# Patient Record
Sex: Male | Born: 1980
Health system: Southern US, Community
[De-identification: ages and names within clinical notes are randomized; demographics above are authoritative.]

## PROBLEM LIST (undated history)

## (undated) ENCOUNTER — Ambulatory Visit (HOSPITAL_COMMUNITY): Discharge status: Absent without leave | Payer: MEDICAID

## (undated) DIAGNOSIS — B1920 Unspecified viral hepatitis C without hepatic coma: Secondary | ICD-10-CM

## (undated) HISTORY — PX: FRACTURE SURGERY: SHX138

## (undated) HISTORY — PX: ANKLE SURGERY: SHX546

---

## 2002-06-26 ENCOUNTER — Encounter: Payer: Self-pay | Admitting: Emergency Medicine

## 2002-06-26 ENCOUNTER — Emergency Department (HOSPITAL_COMMUNITY): Admission: EM | Admit: 2002-06-26 | Discharge: 2002-06-26 | Payer: Self-pay | Admitting: Emergency Medicine

## 2008-02-02 ENCOUNTER — Emergency Department (HOSPITAL_COMMUNITY): Admission: EM | Admit: 2008-02-02 | Discharge: 2008-02-03 | Payer: Self-pay | Admitting: Emergency Medicine

## 2008-03-21 ENCOUNTER — Emergency Department (HOSPITAL_COMMUNITY): Admission: EM | Admit: 2008-03-21 | Discharge: 2008-03-21 | Payer: Self-pay | Admitting: Emergency Medicine

## 2009-09-30 ENCOUNTER — Emergency Department (HOSPITAL_COMMUNITY): Admission: EM | Admit: 2009-09-30 | Discharge: 2009-09-30 | Payer: Self-pay | Admitting: Emergency Medicine

## 2009-10-05 ENCOUNTER — Inpatient Hospital Stay (HOSPITAL_COMMUNITY): Admission: RE | Admit: 2009-10-05 | Discharge: 2009-10-05 | Payer: Self-pay | Admitting: Orthopaedic Surgery

## 2010-11-23 ENCOUNTER — Ambulatory Visit (HOSPITAL_COMMUNITY)
Admission: RE | Admit: 2010-11-23 | Discharge: 2010-11-23 | Payer: Self-pay | Source: Home / Self Care | Attending: Internal Medicine | Admitting: Internal Medicine

## 2011-03-17 LAB — URINALYSIS, ROUTINE W REFLEX MICROSCOPIC
Hgb urine dipstick: NEGATIVE
Nitrite: NEGATIVE
Protein, ur: NEGATIVE mg/dL
Specific Gravity, Urine: 1.026 (ref 1.005–1.030)
Urobilinogen, UA: 0.2 mg/dL (ref 0.0–1.0)

## 2011-03-17 LAB — COMPREHENSIVE METABOLIC PANEL
ALT: 15 U/L (ref 0–53)
AST: 22 U/L (ref 0–37)
Calcium: 9.4 mg/dL (ref 8.4–10.5)
Creatinine, Ser: 0.81 mg/dL (ref 0.4–1.5)
GFR calc Af Amer: >60 mL/min (ref >60)
Sodium: 138 mEq/L (ref 135–145)
Total Protein: 7.4 g/dL (ref 6.0–8.3)

## 2011-03-17 LAB — CBC
MCHC: 35.3 g/dL (ref 30.0–36.0)
RDW: 13.3 % (ref 11.5–15.5)

## 2011-03-17 LAB — DIFFERENTIAL
Eosinophils Absolute: 0.3 10*3/uL (ref 0.0–0.7)
Eosinophils Relative: 4 % (ref 0–5)
Lymphocytes Relative: 20 % (ref 12–46)
Lymphs Abs: 1.7 10*3/uL (ref 0.7–4.0)
Monocytes Relative: 10 % (ref 3–12)
Neutrophils Relative %: 67 % (ref 43–77)

## 2011-03-17 LAB — PROTIME-INR: INR: 0.87 (ref 0.00–1.49)
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
## 2011-07-13 ENCOUNTER — Emergency Department (HOSPITAL_COMMUNITY)
Admission: EM | Admit: 2011-07-13 | Discharge: 2011-07-13 | Discharge status: Against Medical Advice | Payer: Self-pay | Attending: Emergency Medicine | Admitting: Emergency Medicine

## 2011-07-13 DIAGNOSIS — R1011 Right upper quadrant pain: Secondary | ICD-10-CM | POA: Insufficient documentation

## 2012-06-16 ENCOUNTER — Encounter (HOSPITAL_COMMUNITY): Payer: Self-pay | Admitting: *Deleted

## 2012-06-16 DIAGNOSIS — F17200 Nicotine dependence, unspecified, uncomplicated: Secondary | ICD-10-CM | POA: Insufficient documentation

## 2012-06-16 DIAGNOSIS — T148XXA Other injury of unspecified body region, initial encounter: Secondary | ICD-10-CM | POA: Insufficient documentation
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
## 2012-06-17 ENCOUNTER — Emergency Department (HOSPITAL_COMMUNITY)
Admission: EM | Admit: 2012-06-17 | Discharge: 2012-06-17 | Disposition: A | Discharge status: Home / Self Care | Payer: No Typology Code available for payment source | Attending: Emergency Medicine | Admitting: Emergency Medicine

## 2012-06-17 DIAGNOSIS — T148XXA Other injury of unspecified body region, initial encounter: Secondary | ICD-10-CM

## 2012-06-17 MED ORDER — IBUPROFEN 600 MG PO TABS: 600.0000 mg | ORAL_TABLET | Freq: Four times a day (QID) | ORAL | Status: AC | PRN

## 2012-06-17 MED ORDER — OXYCODONE-ACETAMINOPHEN 5-325 MG PO TABS
1.0000 | ORAL_TABLET | Freq: Once | ORAL | Status: AC
  Administered 2012-06-17: 1 via ORAL
  Filled 2012-06-17: qty 1

## 2012-06-17 MED ORDER — IBUPROFEN 400 MG PO TABS
600.0000 mg | ORAL_TABLET | Freq: Once | ORAL | Status: AC
  Administered 2012-06-17: 600 mg via ORAL
  Filled 2012-06-17: qty 1

## 2012-06-17 MED ORDER — OXYCODONE-ACETAMINOPHEN 5-325 MG PO TABS: 1.0000 | ORAL_TABLET | Freq: Four times a day (QID) | ORAL | Status: AC | PRN

## 2012-06-17 MED ORDER — METAXALONE 800 MG PO TABS
800.0000 mg | ORAL_TABLET | Freq: Once | ORAL | Status: AC
  Administered 2012-06-17: 800 mg via ORAL
  Filled 2012-06-17: qty 1

## 2012-06-17 MED ORDER — METAXALONE 800 MG PO TABS: 800.0000 mg | ORAL_TABLET | Freq: Three times a day (TID) | ORAL | Status: AC

## 2012-06-17 NOTE — ED Notes (Signed)
Pt for discharge.Vital signs stable.GCS 15 
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
## 2012-06-17 NOTE — ED Provider Notes (Signed)
Medical screening examination/treatment/procedure(s) were performed by non-physician practitioner and as supervising physician I was immediately available for consultation/collaboration.  Yilia Sacca M Cheresa Siers, MD 06/17/12 2153 

## 2013-03-20 ENCOUNTER — Emergency Department (HOSPITAL_COMMUNITY)
Admission: EM | Admit: 2013-03-20 | Discharge: 2013-03-20 | Disposition: A | Discharge status: Home / Self Care | Payer: Self-pay | Attending: Emergency Medicine | Admitting: Emergency Medicine

## 2013-03-20 ENCOUNTER — Encounter (HOSPITAL_COMMUNITY): Payer: Self-pay | Admitting: *Deleted

## 2013-03-20 DIAGNOSIS — J3489 Other specified disorders of nose and nasal sinuses: Secondary | ICD-10-CM | POA: Insufficient documentation

## 2013-03-20 DIAGNOSIS — K029 Dental caries, unspecified: Secondary | ICD-10-CM | POA: Insufficient documentation

## 2013-03-20 DIAGNOSIS — R220 Localized swelling, mass and lump, head: Secondary | ICD-10-CM | POA: Insufficient documentation

## 2013-03-20 DIAGNOSIS — K047 Periapical abscess without sinus: Secondary | ICD-10-CM | POA: Insufficient documentation

## 2013-03-20 DIAGNOSIS — F17200 Nicotine dependence, unspecified, uncomplicated: Secondary | ICD-10-CM | POA: Insufficient documentation

## 2013-03-20 DIAGNOSIS — R221 Localized swelling, mass and lump, neck: Secondary | ICD-10-CM | POA: Insufficient documentation

## 2013-03-20 DIAGNOSIS — R6883 Chills (without fever): Secondary | ICD-10-CM | POA: Insufficient documentation

## 2013-03-20 MED ORDER — OXYCODONE-ACETAMINOPHEN 5-325 MG PO TABS: 2.0000 | ORAL_TABLET | ORAL | Status: DC | PRN

## 2013-03-20 MED ORDER — PENICILLIN V POTASSIUM 500 MG PO TABS: 500.0000 mg | ORAL_TABLET | Freq: Three times a day (TID) | ORAL | Status: AC

## 2013-03-20 MED ORDER — ONDANSETRON 4 MG PO TBDP
4.0000 mg | ORAL_TABLET | Freq: Once | ORAL | Status: AC
  Administered 2013-03-20: 4 mg via ORAL
  Filled 2013-03-20: qty 1

## 2013-03-20 MED ORDER — HYDROMORPHONE HCL PF 2 MG/ML IJ SOLN
2.0000 mg | Freq: Once | INTRAMUSCULAR | Status: AC
  Administered 2013-03-20: 2 mg via INTRAMUSCULAR
  Filled 2013-03-20: qty 1

## 2013-03-20 NOTE — ED Notes (Signed)
Pt slammed the door to the room yelling inside the room  Throwing things cursing because he is still waiting to be seen

## 2013-03-20 NOTE — ED Notes (Signed)
The pt is very angry about the wait to see a doctor he reports that his tooth is killing him

## 2013-03-20 NOTE — ED Notes (Signed)
Med given pts mother at the bedside

## 2013-03-20 NOTE — ED Notes (Signed)
Pt with intermittent dental pain for some time (tooth decay noted to L upper molar).  Last night pt noticed swelling to L maxillary sinus.  No redness, but swelling noted.  Pt afebrile.

## 2013-03-20 NOTE — ED Notes (Signed)
Swelling noted to left maxillary area. States has painful tooth in that area.

## 2013-03-20 NOTE — ED Provider Notes (Signed)
History    This chart was scribed for non-physician practitioner Arthor Captain, PA-C working with Joya Gaskins, MD by Gerlean Ren, ED Scribe. This patient was seen in room TR07C/TR07C and the patient's care was started at 4:12 PM.   CSN: [id number]  Arrival date & time 03/20/13  1417   First MD Initiated Contact with Patient 03/20/13 1451      Chief Complaint  Patient presents with  . Facial Swelling  . Dental Pain    The history is provided by the patient. No language interpreter was used.  Jeffery Bennett is a 32 y.o. male who presents to the Emergency Department complaining of constant left upper dental pain that radiates into left jaw with associated facial swelling over affected tooth that began rapidly last night.  No associated injury or trauma to the area.  Pt reports chills last night with no fever measured.  Pain worsened with temperature.  Pt denies painful swallowing or difficulty swallowing but reports pain with eating.  Pt reports h/o dental problems.  Pt is a current everyday smoker.  No known allergies.   History reviewed. No pertinent past medical history.  Past Surgical History  Procedure Laterality Date  . Ankle surgery      No family history on file.  History  Substance Use Topics  . Smoking status: Current Every Day Smoker -- 1.00 packs/day    Types: Cigarettes  . Smokeless tobacco: Not on file  . Alcohol Use: No      Review of Systems  Constitutional: Negative for fever and chills.  HENT: Positive for facial swelling, dental problem and sinus pressure. Negative for sore throat, trouble swallowing and voice change.   Respiratory: Negative for cough and shortness of breath.   Cardiovascular: Negative for chest pain and palpitations.  Gastrointestinal: Negative for vomiting, abdominal pain, diarrhea and constipation.  Genitourinary: Negative for dysuria, urgency and frequency.  Musculoskeletal: Negative for myalgias and arthralgias.  Skin:  Negative for rash.  Neurological: Negative for numbness and headaches.   See HPI.  Allergies  Review of patient's allergies indicates no known allergies.  Home Medications   Current Outpatient Rx  Name  Route  Sig  Dispense  Refill  . naproxen sodium (ANAPROX) 220 MG tablet   Oral   Take 880 mg by mouth daily as needed (tooth ache.).           BP 147/98  Pulse 110  Temp(Src) 98.2 F (36.8 C) (Oral)  Resp 16  SpO2 99%  Physical Exam  Nursing note and vitals reviewed. Constitutional: He is oriented to person, place, and time. He appears well-developed and well-nourished. No distress.  HENT:  Head: Normocephalic and atraumatic.    Mouth/Throat: Uvula is midline. No oral lesions. Dental abscesses and dental caries present. No edematous.    Uvula midline, no pharyngeal erythema. Moderate swelling of left maxillary area.    Eyes: EOM are normal.  Neck: Neck supple. No tracheal deviation present.  No cervical adenopathy  Cardiovascular: Normal rate.   Pulmonary/Chest: Effort normal. No respiratory distress.  Musculoskeletal: Normal range of motion.  Neurological: He is alert and oriented to person, place, and time.  Skin: Skin is warm and dry.  Psychiatric: He has a normal mood and affect. His behavior is normal.    ED Course  Procedures (including critical care time) INCISION AND DRAINAGE Performed by: Arthor Captain Consent: Verbal consent obtained. Risks and benefits: risks, benefits and alternatives were discussed Type: abscess  Body area:  dental  Anesthesia: local infiltration  Incision was made with a scalpel.  Local anesthetic: bupivicaine 0.5% with epinephrine  Anesthetic total: 0.2 ml  Complexity: complex Blunt dissection to break up loculations  Drainage: bloody   Packing material: 1/4 in iodoform gauze  Patient tolerance: Patient tolerated the procedure well with no immediate complications. unable to drain abscess of purulent  discharge.   DIAGNOSTIC STUDIES: Oxygen Saturation is 99% on room air, normal by my interpretation.    COORDINATION OF CARE: 4:15 PM- Discussed pain relief here and possible I&D to open fluctuant region.  Pt verbalizes consent and understanding.      1. Dental abscess       MDM  Patient with toothache and abscess.  Exam unconcerning for Ludwig's angina or spread of infection.  Will treat with penicillin and pain medicine.  Urged patient to follow-up with dentist.      I personally performed the services described in this documentation, which was scribed in my presence. The recorded information has been reviewed and is accurate.       Arthor Captain, PA-C 03/24/13 1814

## 2013-03-25 NOTE — ED Provider Notes (Signed)
Medical screening examination/treatment/procedure(s) were performed by non-physician practitioner and as supervising physician I was immediately available for consultation/collaboration.   Joya Gaskins, MD 03/25/13 [phone]

## 2014-05-13 ENCOUNTER — Emergency Department (HOSPITAL_COMMUNITY)
Admission: EM | Admit: 2014-05-13 | Discharge: 2014-05-13 | Disposition: A | Discharge status: Home / Self Care | Payer: Self-pay | Attending: Emergency Medicine | Admitting: Emergency Medicine

## 2014-05-13 ENCOUNTER — Emergency Department (HOSPITAL_COMMUNITY): Payer: Self-pay

## 2014-05-13 ENCOUNTER — Encounter (HOSPITAL_COMMUNITY): Payer: Self-pay | Admitting: Emergency Medicine

## 2014-05-13 DIAGNOSIS — Z791 Long term (current) use of non-steroidal anti-inflammatories (NSAID): Secondary | ICD-10-CM | POA: Insufficient documentation

## 2014-05-13 DIAGNOSIS — Z79899 Other long term (current) drug therapy: Secondary | ICD-10-CM | POA: Insufficient documentation

## 2014-05-13 DIAGNOSIS — Z8781 Personal history of (healed) traumatic fracture: Secondary | ICD-10-CM | POA: Insufficient documentation

## 2014-05-13 DIAGNOSIS — F17200 Nicotine dependence, unspecified, uncomplicated: Secondary | ICD-10-CM | POA: Insufficient documentation

## 2014-05-13 DIAGNOSIS — M25579 Pain in unspecified ankle and joints of unspecified foot: Secondary | ICD-10-CM | POA: Insufficient documentation

## 2014-05-13 MED ORDER — NAPROXEN 500 MG PO TABS: 500.0000 mg | ORAL_TABLET | Freq: Two times a day (BID) | ORAL | Status: DC

## 2014-05-13 MED ORDER — OXYCODONE-ACETAMINOPHEN 5-325 MG PO TABS: 1.0000 | ORAL_TABLET | Freq: Four times a day (QID) | ORAL | Status: DC | PRN

## 2014-05-13 NOTE — ED Notes (Signed)
Pt was working out in yard yesterday and thinks screw came out of his ankle.  Painful to apply pressure.

## 2014-05-13 NOTE — ED Provider Notes (Signed)
Medical screening examination/treatment/procedure(s) were performed by non-physician practitioner and as supervising physician I was immediately available for consultation/collaboration.  Christopher J. Pollina, MD 05/13/14 2059 
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
## 2014-05-13 NOTE — Discharge Instructions (Signed)
Please read and follow all provided instructions.  Your diagnoses today include:  1. Ankle pain     Tests performed today include:  An x-ray of the affected area - shows loose screw in ankle  Vital signs. See below for your results today.   Medications prescribed:   Percocet (oxycodone/acetaminophen) - narcotic pain medication  DO NOT drive or perform any activities that require you to be awake and alert because this medicine can make you drowsy. BE VERY CAREFUL not to take multiple medicines containing Tylenol (also called acetaminophen). Doing so can lead to an overdose which can damage your liver and cause liver failure and possibly death.  Take any prescribed medications only as directed.  Home care instructions:   Follow any educational materials contained in this packet  Follow R.I.C.E. Protocol:  R - rest your injury   I  - use ice on injury without applying directly to skin  C - compress injury with bandage or splint  E - elevate the injury as much as possible  Follow-up instructions: Please follow-up with your orthopedic physician. Call tomorrow for an appointment.   Return instructions:   Please return if your toes are numb or tingling, appear gray or blue, or you have severe pain (also elevate leg and loosen splint or wrap if you were given one)  Please return to the Emergency Department if you experience worsening symptoms.   Please return if you have any other emergent concerns.  Additional Information:  Your vital signs today were: BP 124/85   Pulse 120   Temp(Src) 98.4 F (36.9 C) (Oral)   Resp 18   Wt 155 lb 6 oz (70.478 kg)   SpO2 98% If your blood pressure (BP) was elevated above 135/85 this visit, please have this repeated by your doctor within one month. --------------

## 2015-11-06 ENCOUNTER — Encounter (HOSPITAL_COMMUNITY): Payer: Self-pay | Admitting: Emergency Medicine

## 2015-11-06 ENCOUNTER — Emergency Department (HOSPITAL_COMMUNITY)
Admission: EM | Admit: 2015-11-06 | Discharge: 2015-11-06 | Disposition: A | Discharge status: Home / Self Care | Payer: Self-pay | Attending: Emergency Medicine | Admitting: Emergency Medicine

## 2015-11-06 DIAGNOSIS — K0381 Cracked tooth: Secondary | ICD-10-CM | POA: Insufficient documentation

## 2015-11-06 DIAGNOSIS — F17210 Nicotine dependence, cigarettes, uncomplicated: Secondary | ICD-10-CM | POA: Insufficient documentation

## 2015-11-06 DIAGNOSIS — K0889 Other specified disorders of teeth and supporting structures: Secondary | ICD-10-CM | POA: Insufficient documentation

## 2015-11-06 MED ORDER — PENICILLIN V POTASSIUM 500 MG PO TABS: 500.0000 mg | ORAL_TABLET | Freq: Three times a day (TID) | ORAL | Status: DC

## 2015-11-06 MED ORDER — IBUPROFEN 800 MG PO TABS: 800.0000 mg | ORAL_TABLET | Freq: Three times a day (TID) | ORAL | Status: DC

## 2015-11-06 NOTE — Discharge Instructions (Signed)

## 2015-11-06 NOTE — ED Notes (Signed)
Pt offered dental block for pain control and refused.

## 2015-11-06 NOTE — ED Notes (Addendum)
See PA assessment, PA at bedside completing assessment with RN present.

## 2015-11-06 NOTE — ED Provider Notes (Signed)
CSN: [id number]     Arrival date & time 11/06/15  0945 History  By signing my name below, I, Emmanuella Mensah, attest that this documentation has been prepared under the direction and in the presence of Fayrene HelperBowie Nur Rabold, PA-C. Electronically Signed: Angelene GiovanniEmmanuella Mensah, ED Scribe. 11/06/2015. 11:19 AM.   Chief Complaint  Patient presents with  . Dental Pain   The history is provided by the patient. No language interpreter was used.   HPI Comments: Jeffery Bennett is a 34 y.o. male who presents to the Emergency Department complaining of a gradually worsening moderate constant right lower dental pain onset 3 days ago. Pt reports that the filling in that tooth came out. He denies any fever. He states that he has been taking Amoxicillin that his girlfriend had leftover from one month ago. He adds that he has been taking the abx twice a day, once in the morning and once at night for the past 3 with no relief. He reports that he has also been taking aspirin and washing his mouth with mouthwash and peroxide. He did not note that anything makes the pain worse or better.   No dentist.    History reviewed. No pertinent past medical history. Past Surgical History  Procedure Laterality Date  . Ankle surgery     No family history on file. Social History  Substance Use Topics  . Smoking status: Current Every Day Smoker -- 1.00 packs/day    Types: Cigarettes  . Smokeless tobacco: None  . Alcohol Use: No    Review of Systems  Constitutional: Negative for fever.  HENT: Positive for dental problem.       Allergies  Review of patient's allergies indicates no known allergies.  Home Medications   Prior to Admission medications   Medication Sig Start Date End Date Taking? Authorizing Provider  ibuprofen (ADVIL,MOTRIN) 800 MG tablet Take 1 tablet (800 mg total) by mouth 3 (three) times daily. 11/06/15   Fayrene HelperBowie Sharolyn Weber, PA-C  oxyCODONE-acetaminophen (PERCOCET/ROXICET) 5-325 MG per tablet Take 1-2 tablets  by mouth every 6 (six) hours as needed for severe pain. 05/13/14   Renne CriglerJoshua Geiple, PA-C  penicillin v potassium (VEETID) 500 MG tablet Take 1 tablet (500 mg total) by mouth 3 (three) times daily. 11/06/15   Fayrene HelperBowie Letizia Hook, PA-C   BP 158/101 mmHg  Pulse 67  Temp(Src) 97.6 F (36.4 C) (Oral)  Resp 17  Ht 5\' 9"  (1.753 m)  Wt 160 lb (72.576 kg)  BMI 23.62 kg/m2  SpO2 96% Physical Exam  Constitutional: He is oriented to person, place, and time. He appears well-developed and well-nourished. No distress.  HENT:  Head: Normocephalic and atraumatic.  Mouth/Throat: No trismus in the jaw.  Partial dental fracture in tooth 29, tender to palpation, normal gingiva.  Ulceration noted to the right lateral side of tongue. No trismus. No obvious abscess for drainage.   Eyes: Conjunctivae and EOM are normal.  Neck: Neck supple. No tracheal deviation present.  Cardiovascular: Normal rate.   Pulmonary/Chest: Effort normal. No respiratory distress.  Musculoskeletal: Normal range of motion.  Neurological: He is alert and oriented to person, place, and time.  Skin: Skin is warm and dry.  Psychiatric: He has a normal mood and affect. His behavior is normal.  Nursing note and vitals reviewed.   ED Course  Procedures (including critical care time) DIAGNOSTIC STUDIES: Oxygen Saturation is 96% on RA, adequate by my interpretation.    COORDINATION OF CARE: 11:17 AM- Pt advised of plan for treatment  and pt agrees. Advised to not take medication prescribed for others. Will provide additional antibiotics. Pt has small ulceration to R side of tongue, lateral to dental fracture, suspect from friction of the sharp edge of the tooth.  Will also refer pt to a dentist. Offered dental block and pt declines.      MDM   Final diagnoses:  Pain, dental    BP 158/101 mmHg  Pulse 67  Temp(Src) 97.6 F (36.4 C) (Oral)  Resp 17  Ht  (1.753 m)  Wt 72.576 kg  BMI 23.62 kg/m2  SpO2 96%   I personally performed  the services described in this documentation, which was scribed in my presence. The recorded information has been reviewed and is accurate.     Fayrene Helper, PA-C 11/06/15 1124  Alvira Monday, MD 11/06/15 2023

## 2015-11-06 NOTE — ED Notes (Signed)
Pt. Stated, I had a tooth to break off 3 days ago the pain is unbearable. I've not even been able to sleep

## 2015-11-07 ENCOUNTER — Emergency Department (HOSPITAL_COMMUNITY)
Admission: EM | Admit: 2015-11-07 | Discharge: 2015-11-07 | Disposition: A | Discharge status: Home / Self Care | Payer: Self-pay | Attending: Emergency Medicine | Admitting: Emergency Medicine

## 2015-11-07 ENCOUNTER — Encounter (HOSPITAL_COMMUNITY): Payer: Self-pay

## 2015-11-07 DIAGNOSIS — F17210 Nicotine dependence, cigarettes, uncomplicated: Secondary | ICD-10-CM | POA: Insufficient documentation

## 2015-11-07 DIAGNOSIS — K0381 Cracked tooth: Secondary | ICD-10-CM | POA: Insufficient documentation

## 2015-11-07 DIAGNOSIS — Z792 Long term (current) use of antibiotics: Secondary | ICD-10-CM | POA: Insufficient documentation

## 2015-11-07 DIAGNOSIS — Z79899 Other long term (current) drug therapy: Secondary | ICD-10-CM | POA: Insufficient documentation

## 2015-11-07 DIAGNOSIS — K047 Periapical abscess without sinus: Secondary | ICD-10-CM | POA: Insufficient documentation

## 2015-11-07 MED ORDER — HYDROCODONE-ACETAMINOPHEN 5-325 MG PO TABS: 1.0000 | ORAL_TABLET | Freq: Four times a day (QID) | ORAL | Status: DC | PRN

## 2015-11-07 MED ORDER — HYDROCODONE-ACETAMINOPHEN 5-325 MG PO TABS
1.0000 | ORAL_TABLET | Freq: Once | ORAL | Status: AC
  Administered 2015-11-07: 1 via ORAL
  Filled 2015-11-07: qty 1

## 2015-11-07 NOTE — ED Notes (Signed)
Pt states they are having left lower dental pain that started about a week ago. Pt was see in the ED yesterday and says that the pain is now worse.

## 2015-11-07 NOTE — ED Notes (Signed)
Pt ambulatory to room, drinking from a cup.

## 2015-11-07 NOTE — Discharge Instructions (Signed)
As discussed, it is important to keep your appointment with your dentist on Monday.  Return here for concerning changes in your condition.

## 2015-11-07 NOTE — ED Provider Notes (Signed)
CSN: [id number]     Arrival date & time 11/07/15  [id number] History   First MD Initiated Contact with Patient 11/07/15 0703     Chief Complaint  Patient presents with  . Dental Pain     (Consider location/radiation/quality/duration/timing/severity/associated sxs/prior Treatment) HPI Patient presents one day after being evaluated here, now with concern for persistent left lower dental pain. Patient states that since discharge yesterday he has had pain persistently, in spite of using ibuprofen, Tylenol. Pain is focal, nonradiating, severe. Patient did speak with a dentist, and is scheduled in 2 days for dental evaluation. He denies new interval fever, chills, nausea, vomiting.  History reviewed. No pertinent past medical history. Past Surgical History  Procedure Laterality Date  . Ankle surgery     History reviewed. No pertinent family history. Social History  Substance Use Topics  . Smoking status: Current Every Day Smoker -- 1.00 packs/day    Types: Cigarettes  . Smokeless tobacco: None  . Alcohol Use: No    Review of Systems  Constitutional: Negative for fever.  Respiratory: Negative for shortness of breath.   Cardiovascular: Negative for chest pain.  Musculoskeletal:       Negative aside from HPI  Skin:       Negative aside from HPI  Allergic/Immunologic: Negative for immunocompromised state.  Neurological: Negative for weakness.      Allergies  Review of patient's allergies indicates no known allergies.  Home Medications   Prior to Admission medications   Medication Sig Start Date End Date Taking? Authorizing Provider  ibuprofen (ADVIL,MOTRIN) 800 MG tablet Take 1 tablet (800 mg total) by mouth 3 (three) times daily. 11/06/15  Yes Fayrene HelperBowie Tran, PA-C  penicillin v potassium (VEETID) 500 MG tablet Take 1 tablet (500 mg total) by mouth 3 (three) times daily. 11/06/15  Yes Fayrene HelperBowie Tran, PA-C  HYDROcodone-acetaminophen (NORCO/VICODIN) 5-325 MG tablet Take 1 tablet by mouth  every 6 (six) hours as needed for moderate pain. 11/07/15   Gerhard Munchobert Joah Patlan, MD   BP 150/99 mmHg  Pulse 60  Temp(Src) 98.6 F (37 C) (Oral)  Resp 12  Ht 5\' 9"  (1.753 m)  Wt 160 lb (72.576 kg)  BMI 23.62 kg/m2  SpO2 96% Physical Exam  Constitutional: He is oriented to person, place, and time. He appears well-developed and well-nourished. No distress.  HENT:  Head: Normocephalic and atraumatic.  Mouth/Throat: No trismus in the jaw.  Partial dental fracture in tooth 29, tender to palpation, normal gingiva.  Ulceration noted to the right lateral side of tongue. No trismus. No obvious abscess for drainage.   Eyes: Conjunctivae and EOM are normal.  Neck: Neck supple. No tracheal deviation present.  Cardiovascular: Normal rate.   Pulmonary/Chest: Effort normal. No respiratory distress.  Musculoskeletal: Normal range of motion.  Neurological: He is alert and oriented to person, place, and time.  Skin: Skin is warm and dry.  Psychiatric: He has a normal mood and affect. His behavior is normal.  Nursing note and vitals reviewed.   ED Course  Procedures (including critical care time)   I reviewed the patient's chart from yesterday, notable for dental fracture  MDM   Final diagnoses:  Dental infection   Patient presents with ongoing pain from a dental fracture, mild surrounding inflammatory change, possible early infection. Patient received analgesia here, will follow-up with dental in 2 days.  Gerhard Munchobert Charlet Harr, MD 11/07/15 [phone]

## 2015-11-07 NOTE — ED Notes (Signed)
MD at bedside. 

## 2015-11-08 ENCOUNTER — Encounter (HOSPITAL_COMMUNITY): Payer: Self-pay | Admitting: *Deleted

## 2015-11-08 ENCOUNTER — Emergency Department (HOSPITAL_COMMUNITY)
Admission: EM | Admit: 2015-11-08 | Discharge: 2015-11-08 | Disposition: A | Discharge status: Home / Self Care | Payer: Self-pay | Attending: Emergency Medicine | Admitting: Emergency Medicine

## 2015-11-08 DIAGNOSIS — Z792 Long term (current) use of antibiotics: Secondary | ICD-10-CM | POA: Insufficient documentation

## 2015-11-08 DIAGNOSIS — F17210 Nicotine dependence, cigarettes, uncomplicated: Secondary | ICD-10-CM | POA: Insufficient documentation

## 2015-11-08 DIAGNOSIS — R451 Restlessness and agitation: Secondary | ICD-10-CM | POA: Insufficient documentation

## 2015-11-08 DIAGNOSIS — Z791 Long term (current) use of non-steroidal anti-inflammatories (NSAID): Secondary | ICD-10-CM | POA: Insufficient documentation

## 2015-11-08 DIAGNOSIS — K002 Abnormalities of size and form of teeth: Secondary | ICD-10-CM | POA: Insufficient documentation

## 2015-11-08 DIAGNOSIS — K029 Dental caries, unspecified: Secondary | ICD-10-CM | POA: Insufficient documentation

## 2015-11-08 DIAGNOSIS — K0889 Other specified disorders of teeth and supporting structures: Secondary | ICD-10-CM | POA: Insufficient documentation

## 2015-11-08 MED ORDER — OXYCODONE-ACETAMINOPHEN 5-325 MG PO TABS
2.0000 | ORAL_TABLET | Freq: Once | ORAL | Status: AC
  Administered 2015-11-08: 2 via ORAL
  Filled 2015-11-08: qty 2

## 2015-11-08 MED ORDER — BUPIVACAINE-EPINEPHRINE (PF) 0.5% -1:200000 IJ SOLN
10.0000 mL | Freq: Once | INTRAMUSCULAR | Status: AC
  Administered 2015-11-08: 10 mL
  Filled 2015-11-08: qty 10

## 2015-11-08 NOTE — ED Notes (Signed)
Patient presents stating he broke a tooth on Thursday and the pain has gotten real bad

## 2015-11-08 NOTE — Discharge Instructions (Signed)

## 2015-11-08 NOTE — ED Provider Notes (Signed)
CSN: [id number]     Arrival date & time 11/08/15  0100 History   First MD Initiated Contact with Patient 11/08/15 0128     Chief Complaint  Patient presents with  . Dental Pain     (Consider location/radiation/quality/duration/timing/severity/associated sxs/prior Treatment) Patient is a 34 y.o. male presenting with tooth pain. The history is provided by the patient. No language interpreter was used.  Dental Pain Location:  Lower Lower teeth location:  29/RL 2nd bicuspid Quality:  Aching, sharp, throbbing and pulsating Severity:  Severe Onset quality:  Sudden Duration:  3 days Timing:  Constant Progression:  Waxing and waning Chronicity:  New Context: dental caries, dental fracture and poor dentition   Context: not trauma   Relieved by:  Nothing Ineffective treatments:  NSAIDs, acetaminophen, rest and topical anesthetic gel Associated symptoms: facial pain   Associated symptoms: no difficulty swallowing, no facial swelling, no fever, no neck pain, no oral bleeding and no oral lesions   Risk factors: lack of dental care and smoking     History reviewed. No pertinent past medical history. Past Surgical History  Procedure Laterality Date  . Ankle surgery     No family history on file. Social History  Substance Use Topics  . Smoking status: Current Every Day Smoker -- 1.00 packs/day    Types: Cigarettes  . Smokeless tobacco: Never Used  . Alcohol Use: Yes     Comment: ocassionally    Review of Systems  Constitutional: Negative for fever.  HENT: Positive for dental problem. Negative for facial swelling and mouth sores.   Musculoskeletal: Negative for neck pain.  Psychiatric/Behavioral: Positive for agitation.  All other systems reviewed and are negative.   Allergies  Review of patient's allergies indicates no known allergies.  Home Medications   Prior to Admission medications   Medication Sig Start Date End Date Taking? Authorizing Provider    HYDROcodone-acetaminophen (NORCO/VICODIN) 5-325 MG tablet Take 1 tablet by mouth every 6 (six) hours as needed for moderate pain. 11/07/15   Gerhard Munchobert Lockwood, MD  ibuprofen (ADVIL,MOTRIN) 800 MG tablet Take 1 tablet (800 mg total) by mouth 3 (three) times daily. 11/06/15   Fayrene HelperBowie Tran, PA-C  penicillin v potassium (VEETID) 500 MG tablet Take 1 tablet (500 mg total) by mouth 3 (three) times daily. 11/06/15   Fayrene HelperBowie Tran, PA-C   BP 137/101 mmHg  Pulse 76  Temp(Src) 97.6 F (36.4 C) (Axillary)  Resp 12  Ht 5\' 9"  (1.753 m)  Wt 72.576 kg  BMI 23.62 kg/m2  SpO2 98%   Physical Exam  Constitutional: He is oriented to person, place, and time. He appears well-developed and well-nourished. No distress.  Nontoxic/nonseptic appearing  HENT:  Head: Normocephalic and atraumatic.  Mouth/Throat: Oropharynx is clear and moist and mucous membranes are normal. No trismus in the jaw. Abnormal dentition. Dental caries present.    No gingival swelling or fluctuance. No trismus. Patient tolerating secretions without difficulty.  Eyes: Conjunctivae and EOM are normal. No scleral icterus.  Neck: Normal range of motion.  No nuchal rigidity or meningismus  Pulmonary/Chest: Effort normal. No respiratory distress.  Musculoskeletal: Normal range of motion.  Neurological: He is alert and oriented to person, place, and time. He exhibits normal muscle tone. Coordination normal.  Skin: Skin is warm and dry. No rash noted. He is not diaphoretic. No erythema. No pallor.  Psychiatric: He has a normal mood and affect. He is agitated.  Nursing note and vitals reviewed.   ED Course  Dental Date/Time: 11/08/2015  1:40 AM Performed by: Antony Madura Authorized by: Antony Madura Consent: The procedure was performed in an emergent situation. Verbal consent obtained. Written consent not obtained. Risks and benefits: risks, benefits and alternatives were discussed Consent given by: patient Patient understanding: patient  states understanding of the procedure being performed Patient consent: the patient's understanding of the procedure matches consent given Procedure consent: procedure consent matches procedure scheduled Relevant documents: relevant documents present and verified Test results: test results available and properly labeled Site marked: the operative site was marked Imaging studies: imaging studies available Required items: required blood products, implants, devices, and special equipment available Patient identity confirmed: verbally with patient and arm band Time out: Immediately prior to procedure a "time out" was called to verify the correct patient, procedure, equipment, support staff and site/side marked as required. Preparation: Patient was prepped and draped in the usual sterile fashion. Local anesthesia used: yes Anesthesia: local infiltration Local anesthetic: bupivacaine 0.5% with epinephrine Anesthetic total: 1.8 ml Patient sedated: no Patient tolerance: Patient tolerated the procedure well with no immediate complications   (including critical care time) Labs Review Labs Reviewed - No data to display  Imaging Review No results found.   I have personally reviewed and evaluated these images and lab results as part of my medical decision-making.   EKG Interpretation None      MDM   Final diagnoses:  Dentalgia    Patient with toothache. No gross abscess. Exam unconcerning for Ludwig's angina or spread of infection. Patient has been prescribed Norco as well as penicillin tablets. Urged patient to follow-up with dentist. Dental block performed prior to discharge for pain control. Patient discharged in good condition with no unaddressed concerns.     Antony Madura, PA-C 11/08/15 1610  Loren Racer, MD 11/10/15 1345

## 2016-01-14 ENCOUNTER — Inpatient Hospital Stay (HOSPITAL_COMMUNITY): Payer: Self-pay

## 2016-01-14 ENCOUNTER — Emergency Department (HOSPITAL_COMMUNITY): Payer: Self-pay

## 2016-01-14 ENCOUNTER — Inpatient Hospital Stay (HOSPITAL_COMMUNITY)
Admission: EM | Admit: 2016-01-14 | Discharge: 2016-01-21 | DRG: 956 | Disposition: A | Discharge status: Home Health | Payer: Self-pay | Attending: General Surgery | Admitting: General Surgery

## 2016-01-14 ENCOUNTER — Emergency Department (HOSPITAL_COMMUNITY): Payer: MEDICAID

## 2016-01-14 ENCOUNTER — Encounter (HOSPITAL_COMMUNITY): Payer: Self-pay | Admitting: Emergency Medicine

## 2016-01-14 ENCOUNTER — Inpatient Hospital Stay (HOSPITAL_COMMUNITY): Payer: MEDICAID | Admitting: Certified Registered Nurse Anesthetist

## 2016-01-14 ENCOUNTER — Inpatient Hospital Stay (HOSPITAL_COMMUNITY): Payer: Self-pay | Admitting: Certified Registered Nurse Anesthetist

## 2016-01-14 ENCOUNTER — Encounter (HOSPITAL_COMMUNITY): Admission: EM | Disposition: A | Discharge status: Home Health | Payer: Self-pay | Source: Home / Self Care

## 2016-01-14 DIAGNOSIS — S27322A Contusion of lung, bilateral, initial encounter: Secondary | ICD-10-CM | POA: Diagnosis present

## 2016-01-14 DIAGNOSIS — S41112A Laceration without foreign body of left upper arm, initial encounter: Secondary | ICD-10-CM | POA: Diagnosis present

## 2016-01-14 DIAGNOSIS — S272XXA Traumatic hemopneumothorax, initial encounter: Secondary | ICD-10-CM

## 2016-01-14 DIAGNOSIS — S270XXA Traumatic pneumothorax, initial encounter: Secondary | ICD-10-CM | POA: Diagnosis present

## 2016-01-14 DIAGNOSIS — T1490XA Injury, unspecified, initial encounter: Secondary | ICD-10-CM

## 2016-01-14 DIAGNOSIS — S42302B Unspecified fracture of shaft of humerus, left arm, initial encounter for open fracture: Secondary | ICD-10-CM

## 2016-01-14 DIAGNOSIS — M542 Cervicalgia: Secondary | ICD-10-CM

## 2016-01-14 DIAGNOSIS — B1920 Unspecified viral hepatitis C without hepatic coma: Secondary | ICD-10-CM | POA: Diagnosis present

## 2016-01-14 DIAGNOSIS — S42209A Unspecified fracture of upper end of unspecified humerus, initial encounter for closed fracture: Secondary | ICD-10-CM

## 2016-01-14 DIAGNOSIS — J9811 Atelectasis: Secondary | ICD-10-CM | POA: Diagnosis not present

## 2016-01-14 DIAGNOSIS — S42302A Unspecified fracture of shaft of humerus, left arm, initial encounter for closed fracture: Secondary | ICD-10-CM | POA: Diagnosis present

## 2016-01-14 DIAGNOSIS — D62 Acute posthemorrhagic anemia: Secondary | ICD-10-CM | POA: Diagnosis not present

## 2016-01-14 DIAGNOSIS — S72009A Fracture of unspecified part of neck of unspecified femur, initial encounter for closed fracture: Secondary | ICD-10-CM

## 2016-01-14 DIAGNOSIS — S22019A Unspecified fracture of first thoracic vertebra, initial encounter for closed fracture: Secondary | ICD-10-CM | POA: Diagnosis present

## 2016-01-14 DIAGNOSIS — S42202B Unspecified fracture of upper end of left humerus, initial encounter for open fracture: Secondary | ICD-10-CM | POA: Diagnosis present

## 2016-01-14 DIAGNOSIS — F17200 Nicotine dependence, unspecified, uncomplicated: Secondary | ICD-10-CM | POA: Diagnosis present

## 2016-01-14 DIAGNOSIS — Z419 Encounter for procedure for purposes other than remedying health state, unspecified: Secondary | ICD-10-CM

## 2016-01-14 DIAGNOSIS — S72142A Displaced intertrochanteric fracture of left femur, initial encounter for closed fracture: Principal | ICD-10-CM | POA: Diagnosis present

## 2016-01-14 DIAGNOSIS — S22009A Unspecified fracture of unspecified thoracic vertebra, initial encounter for closed fracture: Secondary | ICD-10-CM | POA: Diagnosis present

## 2016-01-14 DIAGNOSIS — F1910 Other psychoactive substance abuse, uncomplicated: Secondary | ICD-10-CM | POA: Diagnosis present

## 2016-01-14 DIAGNOSIS — J939 Pneumothorax, unspecified: Secondary | ICD-10-CM

## 2016-01-14 DIAGNOSIS — S2243XA Multiple fractures of ribs, bilateral, initial encounter for closed fracture: Secondary | ICD-10-CM | POA: Diagnosis present

## 2016-01-14 HISTORY — PX: ORIF HUMERUS FRACTURE: SHX2126

## 2016-01-14 HISTORY — PX: FEMUR IM NAIL: SHX1597

## 2016-01-14 HISTORY — DX: Unspecified viral hepatitis C without hepatic coma: B19.20

## 2016-01-14 HISTORY — PX: FEMUR FRACTURE SURGERY: SHX633

## 2016-01-14 HISTORY — PX: ORIF PROXIMAL HUMERUS FRACTURE: SUR951

## 2016-01-14 LAB — COMPREHENSIVE METABOLIC PANEL
ALBUMIN: 3.7 g/dL (ref 3.5–5.0)
ALK PHOS: 57 U/L (ref 38–126)
ALT: 65 U/L — ABNORMAL HIGH (ref 17–63)
ANION GAP: 12 (ref 5–15)
AST: 93 U/L — ABNORMAL HIGH (ref 15–41)
BILIRUBIN TOTAL: 0.4 mg/dL (ref 0.3–1.2)
BUN: 17 mg/dL (ref 6–20)
CALCIUM: 9 mg/dL (ref 8.9–10.3)
CO2: 26 mmol/L (ref 22–32)
Chloride: 103 mmol/L (ref 101–111)
Creatinine, Ser: 1.26 mg/dL — ABNORMAL HIGH (ref 0.61–1.24)
GLUCOSE: 126 mg/dL — AB (ref 65–99)
Potassium: 3.3 mmol/L — ABNORMAL LOW (ref 3.5–5.1)
Sodium: 141 mmol/L (ref 135–145)
TOTAL PROTEIN: 6.7 g/dL (ref 6.5–8.1)

## 2016-01-14 LAB — CBC
HCT: 38.9 % — ABNORMAL LOW (ref 39.0–52.0)
HEMOGLOBIN: 13.3 g/dL (ref 13.0–17.0)
MCH: 29 pg (ref 26.0–34.0)
MCHC: 34.2 g/dL (ref 30.0–36.0)
MCV: 84.9 fL (ref 78.0–100.0)
Platelets: 271 10*3/uL (ref 150–400)
RBC: 4.58 MIL/uL (ref 4.22–5.81)
RDW: 13.7 % (ref 11.5–15.5)
WBC: 17.9 10*3/uL — AB (ref 4.0–10.5)

## 2016-01-14 LAB — CDS SEROLOGY

## 2016-01-14 LAB — MRSA PCR SCREENING: MRSA BY PCR: NEGATIVE

## 2016-01-14 LAB — SAMPLE TO BLOOD BANK

## 2016-01-14 LAB — PROTIME-INR
INR: 0.97 (ref 0.00–1.49)
PROTHROMBIN TIME: 13.1 s (ref 11.6–15.2)

## 2016-01-14 LAB — ETHANOL

## 2016-01-14 SURGERY — INSERTION, INTRAMEDULLARY ROD, FEMUR
Anesthesia: General | Laterality: Left

## 2016-01-14 MED ORDER — HYDROMORPHONE HCL 1 MG/ML IJ SOLN
INTRAMUSCULAR | Status: AC
  Administered 2016-01-14: 0.5 mg via INTRAVENOUS
  Filled 2016-01-14: qty 1

## 2016-01-14 MED ORDER — POLYETHYLENE GLYCOL 3350 17 G PO PACK
17.0000 g | PACK | Freq: Every day | ORAL | Status: DC
  Administered 2016-01-15 – 2016-01-21 (×7): 17 g via ORAL
  Filled 2016-01-14 (×7): qty 1

## 2016-01-14 MED ORDER — DEXTROSE-NACL 5-0.45 % IV SOLN
100.0000 mL/h | INTRAVENOUS | Status: DC
  Administered 2016-01-14: 100 mL/h via INTRAVENOUS

## 2016-01-14 MED ORDER — MIDAZOLAM HCL 5 MG/5ML IJ SOLN
INTRAMUSCULAR | Status: DC | PRN
  Administered 2016-01-14: 2 mg via INTRAVENOUS

## 2016-01-14 MED ORDER — NALOXONE HCL 0.4 MG/ML IJ SOLN: 0.4000 mg | INTRAMUSCULAR | Status: DC | PRN

## 2016-01-14 MED ORDER — ONDANSETRON HCL 4 MG PO TABS
4.0000 mg | ORAL_TABLET | Freq: Four times a day (QID) | ORAL | Status: DC | PRN
Start: 2016-01-14 — End: 2016-01-14

## 2016-01-14 MED ORDER — FENTANYL 40 MCG/ML IV SOLN
INTRAVENOUS | Status: DC
  Administered 2016-01-14: 15 ug via INTRAVENOUS
  Administered 2016-01-15: 75 ug via INTRAVENOUS
  Administered 2016-01-15: 15 ug via INTRAVENOUS
  Administered 2016-01-15: 08:00:00 via INTRAVENOUS
  Administered 2016-01-15: 19 ug via INTRAVENOUS
  Filled 2016-01-14: qty 25

## 2016-01-14 MED ORDER — METHOCARBAMOL 500 MG PO TABS
500.0000 mg | ORAL_TABLET | Freq: Four times a day (QID) | ORAL | Status: DC | PRN
  Administered 2016-01-15 – 2016-01-21 (×16): 500 mg via ORAL
  Filled 2016-01-14 (×16): qty 1

## 2016-01-14 MED ORDER — PANTOPRAZOLE SODIUM 40 MG PO TBEC
40.0000 mg | DELAYED_RELEASE_TABLET | Freq: Every day | ORAL | Status: DC
Start: 2016-01-14 — End: 2016-01-15

## 2016-01-14 MED ORDER — FENTANYL CITRATE (PF) 100 MCG/2ML IJ SOLN
50.0000 ug | INTRAMUSCULAR | Status: DC | PRN
  Administered 2016-01-14 (×2): 100 ug via INTRAVENOUS
  Administered 2016-01-14 (×2): 50 ug via INTRAVENOUS
  Filled 2016-01-14: qty 2

## 2016-01-14 MED ORDER — FENTANYL CITRATE (PF) 100 MCG/2ML IJ SOLN
100.0000 ug | Freq: Once | INTRAMUSCULAR | Status: AC
  Administered 2016-01-14: 100 ug via INTRAVENOUS
  Filled 2016-01-14: qty 2

## 2016-01-14 MED ORDER — SODIUM CHLORIDE 0.9% FLUSH: 9.0000 mL | INTRAVENOUS | Status: DC | PRN

## 2016-01-14 MED ORDER — TETANUS-DIPHTH-ACELL PERTUSSIS 5-2.5-18.5 LF-MCG/0.5 IM SUSP
INTRAMUSCULAR | Status: AC
  Filled 2016-01-14: qty 0.5

## 2016-01-14 MED ORDER — FENTANYL CITRATE (PF) 100 MCG/2ML IJ SOLN
100.0000 ug | INTRAMUSCULAR | Status: DC | PRN
Start: 2016-01-14 — End: 2016-01-14
  Administered 2016-01-14 (×2): 100 ug via INTRAVENOUS
  Filled 2016-01-14: qty 2

## 2016-01-14 MED ORDER — METHADONE HCL 10 MG PO TABS
60.0000 mg | ORAL_TABLET | Freq: Every day | ORAL | Status: DC
  Administered 2016-01-14: 60 mg via ORAL
  Filled 2016-01-14: qty 6

## 2016-01-14 MED ORDER — DIPHENHYDRAMINE HCL 12.5 MG/5ML PO ELIX: 12.5000 mg | ORAL_SOLUTION | Freq: Four times a day (QID) | ORAL | Status: DC | PRN

## 2016-01-14 MED ORDER — FENTANYL CITRATE (PF) 100 MCG/2ML IJ SOLN
100.0000 ug | Freq: Once | INTRAMUSCULAR | Status: AC
  Administered 2016-01-14: 100 ug via INTRAVENOUS

## 2016-01-14 MED ORDER — HYDROMORPHONE HCL 1 MG/ML IJ SOLN
0.2500 mg | INTRAMUSCULAR | Status: DC | PRN
  Administered 2016-01-14 (×3): 0.5 mg via INTRAVENOUS

## 2016-01-14 MED ORDER — HYDROMORPHONE HCL 1 MG/ML IJ SOLN
INTRAMUSCULAR | Status: AC
  Filled 2016-01-14: qty 1

## 2016-01-14 MED ORDER — ACETAMINOPHEN 650 MG RE SUPP
650.0000 mg | Freq: Four times a day (QID) | RECTAL | Status: DC | PRN
Start: 2016-01-14 — End: 2016-01-21

## 2016-01-14 MED ORDER — FENTANYL 40 MCG/ML IV SOLN
INTRAVENOUS | Status: DC
  Administered 2016-01-14: 18:00:00 via INTRAVENOUS

## 2016-01-14 MED ORDER — SODIUM CHLORIDE 0.9 % IR SOLN
Status: DC | PRN
  Administered 2016-01-14 (×2): 3000 mL

## 2016-01-14 MED ORDER — POTASSIUM CHLORIDE IN NACL 20-0.45 MEQ/L-% IV SOLN
INTRAVENOUS | Status: DC
  Filled 2016-01-14 (×3): qty 1000

## 2016-01-14 MED ORDER — METOCLOPRAMIDE HCL 5 MG/ML IJ SOLN: 5.0000 mg | Freq: Three times a day (TID) | INTRAMUSCULAR | Status: DC | PRN

## 2016-01-14 MED ORDER — ACETAMINOPHEN 325 MG PO TABS: 650.0000 mg | ORAL_TABLET | Freq: Four times a day (QID) | ORAL | Status: DC | PRN

## 2016-01-14 MED ORDER — METHOCARBAMOL 1000 MG/10ML IJ SOLN
500.0000 mg | Freq: Four times a day (QID) | INTRAVENOUS | Status: DC | PRN
  Filled 2016-01-14: qty 5

## 2016-01-14 MED ORDER — FENTANYL CITRATE (PF) 100 MCG/2ML IJ SOLN
INTRAMUSCULAR | Status: AC
  Filled 2016-01-14: qty 2

## 2016-01-14 MED ORDER — FENTANYL CITRATE (PF) 100 MCG/2ML IJ SOLN
INTRAMUSCULAR | Status: AC
  Administered 2016-01-14: 100 ug via INTRAVENOUS
  Filled 2016-01-14: qty 2

## 2016-01-14 MED ORDER — CEFAZOLIN SODIUM-DEXTROSE 2-3 GM-% IV SOLR
INTRAVENOUS | Status: AC
  Filled 2016-01-14: qty 50

## 2016-01-14 MED ORDER — CEFAZOLIN SODIUM-DEXTROSE 2-3 GM-% IV SOLR
INTRAVENOUS | Status: AC
  Administered 2016-01-14: 2 g via INTRAVENOUS
  Filled 2016-01-14: qty 50

## 2016-01-14 MED ORDER — PHENYLEPHRINE HCL 10 MG/ML IJ SOLN: INTRAMUSCULAR | Status: DC | PRN

## 2016-01-14 MED ORDER — LACTATED RINGERS IV SOLN
INTRAVENOUS | Status: DC
  Administered 2016-01-14: 50 mL/h via INTRAVENOUS

## 2016-01-14 MED ORDER — ROCURONIUM BROMIDE 50 MG/5ML IV SOLN
INTRAVENOUS | Status: AC
  Filled 2016-01-14: qty 3

## 2016-01-14 MED ORDER — ONDANSETRON HCL 4 MG/2ML IJ SOLN
INTRAMUSCULAR | Status: AC
  Filled 2016-01-14: qty 2

## 2016-01-14 MED ORDER — SODIUM CHLORIDE 0.9 % IV SOLN
Freq: Once | INTRAVENOUS | Status: AC
  Administered 2016-01-14: 05:00:00 via INTRAVENOUS

## 2016-01-14 MED ORDER — MENTHOL 3 MG MT LOZG
1.0000 | LOZENGE | OROMUCOSAL | Status: DC | PRN
Start: 2016-01-14 — End: 2016-01-21

## 2016-01-14 MED ORDER — LIDOCAINE HCL (CARDIAC) 20 MG/ML IV SOLN
INTRAVENOUS | Status: DC | PRN
  Administered 2016-01-14: 80 mg via INTRAVENOUS

## 2016-01-14 MED ORDER — TETANUS-DIPHTH-ACELL PERTUSSIS 5-2.5-18.5 LF-MCG/0.5 IM SUSP
0.5000 mL | Freq: Once | INTRAMUSCULAR | Status: AC
  Administered 2016-01-14: 0.5 mL via INTRAMUSCULAR

## 2016-01-14 MED ORDER — FENTANYL CITRATE (PF) 100 MCG/2ML IJ SOLN
INTRAMUSCULAR | Status: AC
  Administered 2016-01-14: 50 ug via INTRAVENOUS
  Filled 2016-01-14: qty 2

## 2016-01-14 MED ORDER — FENTANYL CITRATE (PF) 250 MCG/5ML IJ SOLN
INTRAMUSCULAR | Status: AC
  Filled 2016-01-14: qty 5

## 2016-01-14 MED ORDER — PROPOFOL 10 MG/ML IV BOLUS
INTRAVENOUS | Status: AC
  Filled 2016-01-14: qty 20

## 2016-01-14 MED ORDER — HYDROMORPHONE HCL 1 MG/ML IJ SOLN
1.0000 mg | INTRAMUSCULAR | Status: DC | PRN
  Administered 2016-01-14 (×2): 1 mg via INTRAVENOUS
  Filled 2016-01-14: qty 1

## 2016-01-14 MED ORDER — PROMETHAZINE HCL 25 MG/ML IJ SOLN: 6.2500 mg | INTRAMUSCULAR | Status: DC | PRN

## 2016-01-14 MED ORDER — PANTOPRAZOLE SODIUM 40 MG IV SOLR: 40.0000 mg | Freq: Every day | INTRAVENOUS | Status: DC

## 2016-01-14 MED ORDER — ONDANSETRON HCL 4 MG/2ML IJ SOLN: 4.0000 mg | Freq: Four times a day (QID) | INTRAMUSCULAR | Status: DC | PRN

## 2016-01-14 MED ORDER — PHENOL 1.4 % MT LIQD: 1.0000 | OROMUCOSAL | Status: DC | PRN

## 2016-01-14 MED ORDER — FENTANYL CITRATE (PF) 100 MCG/2ML IJ SOLN
INTRAMUSCULAR | Status: AC
  Administered 2016-01-14: 100 ug
  Filled 2016-01-14: qty 2

## 2016-01-14 MED ORDER — LIDOCAINE HCL (CARDIAC) 20 MG/ML IV SOLN
INTRAVENOUS | Status: AC
  Filled 2016-01-14: qty 5

## 2016-01-14 MED ORDER — LACTATED RINGERS IV SOLN
INTRAVENOUS | Status: DC | PRN
  Administered 2016-01-14 (×2): via INTRAVENOUS

## 2016-01-14 MED ORDER — IOHEXOL 300 MG/ML  SOLN
100.0000 mL | Freq: Once | INTRAMUSCULAR | Status: AC | PRN
  Administered 2016-01-14: 100 mL via INTRAVENOUS

## 2016-01-14 MED ORDER — ENOXAPARIN SODIUM 40 MG/0.4ML ~~LOC~~ SOLN
40.0000 mg | SUBCUTANEOUS | Status: DC
  Administered 2016-01-15 – 2016-01-21 (×6): 40 mg via SUBCUTANEOUS
  Filled 2016-01-14 (×6): qty 0.4

## 2016-01-14 MED ORDER — MIDAZOLAM HCL 2 MG/2ML IJ SOLN
INTRAMUSCULAR | Status: AC
  Filled 2016-01-14: qty 2

## 2016-01-14 MED ORDER — FENTANYL CITRATE (PF) 100 MCG/2ML IJ SOLN
100.0000 ug | Freq: Once | INTRAMUSCULAR | Status: AC
  Administered 2016-01-14 (×2): 100 ug via INTRAVENOUS

## 2016-01-14 MED ORDER — DEXTROSE 5 % IV SOLN
10.0000 mg | INTRAVENOUS | Status: DC | PRN
  Administered 2016-01-14: 30 ug/min via INTRAVENOUS

## 2016-01-14 MED ORDER — PROPOFOL 10 MG/ML IV BOLUS
INTRAVENOUS | Status: DC | PRN
  Administered 2016-01-14: 150 mg via INTRAVENOUS

## 2016-01-14 MED ORDER — 0.9 % SODIUM CHLORIDE (POUR BTL) OPTIME
TOPICAL | Status: DC | PRN
  Administered 2016-01-14: 1000 mL

## 2016-01-14 MED ORDER — CEFAZOLIN SODIUM-DEXTROSE 2-3 GM-% IV SOLR
2.0000 g | Freq: Four times a day (QID) | INTRAVENOUS | Status: AC
  Administered 2016-01-14 – 2016-01-15 (×2): 2 g via INTRAVENOUS
  Filled 2016-01-14 (×2): qty 50

## 2016-01-14 MED ORDER — GLYCOPYRROLATE 0.2 MG/ML IJ SOLN
INTRAMUSCULAR | Status: DC | PRN
  Administered 2016-01-14: 0.6 mg via INTRAVENOUS

## 2016-01-14 MED ORDER — MORPHINE SULFATE (PF) 4 MG/ML IV SOLN
6.0000 mg | Freq: Once | INTRAVENOUS | Status: AC
  Administered 2016-01-14: 6 mg via INTRAVENOUS
  Filled 2016-01-14: qty 2

## 2016-01-14 MED ORDER — HYDROMORPHONE 1 MG/ML IV SOLN: INTRAVENOUS | Status: DC

## 2016-01-14 MED ORDER — HYDROMORPHONE HCL 1 MG/ML IJ SOLN
0.2500 mg | INTRAMUSCULAR | Status: DC | PRN
  Administered 2016-01-14: 0.5 mg via INTRAVENOUS

## 2016-01-14 MED ORDER — ROCURONIUM BROMIDE 50 MG/5ML IV SOLN
INTRAVENOUS | Status: AC
  Filled 2016-01-14: qty 1

## 2016-01-14 MED ORDER — LACTATED RINGERS IV SOLN: INTRAVENOUS | Status: DC

## 2016-01-14 MED ORDER — CEFAZOLIN SODIUM-DEXTROSE 2-3 GM-% IV SOLR
2.0000 g | Freq: Once | INTRAVENOUS | Status: AC
  Administered 2016-01-14: 2 g via INTRAVENOUS

## 2016-01-14 MED ORDER — DIPHENHYDRAMINE HCL 50 MG/ML IJ SOLN: 12.5000 mg | Freq: Four times a day (QID) | INTRAMUSCULAR | Status: DC | PRN

## 2016-01-14 MED ORDER — SUCCINYLCHOLINE CHLORIDE 20 MG/ML IJ SOLN
INTRAMUSCULAR | Status: DC | PRN
  Administered 2016-01-14: 100 mg via INTRAVENOUS

## 2016-01-14 MED ORDER — METOCLOPRAMIDE HCL 5 MG PO TABS: 5.0000 mg | ORAL_TABLET | Freq: Three times a day (TID) | ORAL | Status: DC | PRN

## 2016-01-14 MED ORDER — NEOSTIGMINE METHYLSULFATE 10 MG/10ML IV SOLN
INTRAVENOUS | Status: DC | PRN
  Administered 2016-01-14: 4 mg via INTRAVENOUS

## 2016-01-14 MED ORDER — FENTANYL 40 MCG/ML IV SOLN
INTRAVENOUS | Status: AC
  Filled 2016-01-14: qty 25

## 2016-01-14 MED ORDER — ONDANSETRON HCL 4 MG/2ML IJ SOLN
INTRAMUSCULAR | Status: DC | PRN
  Administered 2016-01-14: 4 mg via INTRAVENOUS

## 2016-01-14 MED ORDER — FENTANYL CITRATE (PF) 100 MCG/2ML IJ SOLN
INTRAMUSCULAR | Status: DC | PRN
  Administered 2016-01-14 (×6): 50 ug via INTRAVENOUS

## 2016-01-14 MED ORDER — DIPHENHYDRAMINE HCL 12.5 MG/5ML PO ELIX
12.5000 mg | ORAL_SOLUTION | Freq: Four times a day (QID) | ORAL | Status: DC | PRN
Start: 2016-01-14 — End: 2016-01-15

## 2016-01-14 MED ORDER — FENTANYL CITRATE (PF) 100 MCG/2ML IJ SOLN
50.0000 ug | Freq: Once | INTRAMUSCULAR | Status: DC
  Filled 2016-01-14: qty 1

## 2016-01-14 MED ORDER — ROCURONIUM BROMIDE 100 MG/10ML IV SOLN
INTRAVENOUS | Status: DC | PRN
  Administered 2016-01-14: 30 mg via INTRAVENOUS
  Administered 2016-01-14: 50 mg via INTRAVENOUS
  Administered 2016-01-14: 10 mg via INTRAVENOUS
  Administered 2016-01-14: 20 mg via INTRAVENOUS

## 2016-01-14 MED ORDER — ONDANSETRON HCL 4 MG/2ML IJ SOLN
4.0000 mg | Freq: Once | INTRAMUSCULAR | Status: AC
  Administered 2016-01-14: 4 mg via INTRAVENOUS

## 2016-01-14 MED ORDER — DOCUSATE SODIUM 100 MG PO CAPS
100.0000 mg | ORAL_CAPSULE | Freq: Two times a day (BID) | ORAL | Status: DC
  Administered 2016-01-15 – 2016-01-21 (×14): 100 mg via ORAL
  Filled 2016-01-14 (×14): qty 1

## 2016-01-14 MED ORDER — DIPHENHYDRAMINE HCL 50 MG/ML IJ SOLN
12.5000 mg | Freq: Four times a day (QID) | INTRAMUSCULAR | Status: DC | PRN
Start: 2016-01-14 — End: 2016-01-14

## 2016-01-14 SURGICAL SUPPLY — 95 items
APL SKNCLS STERI-STRIP NONHPOA (GAUZE/BANDAGES/DRESSINGS) ×1
BENZOIN TINCTURE PRP APPL 2/3 (GAUZE/BANDAGES/DRESSINGS) ×3 IMPLANT
BIT DRILL 3.2 (BIT) ×3
BIT DRILL 3.2XCALB NS DISP (BIT) IMPLANT
BIT DRILL AO GAMMA 4.2X180 (BIT) ×2 IMPLANT
BIT DRILL CALIBRATED 2.7 (BIT) ×1 IMPLANT
BIT DRILL CALIBRATED 2.7MM (BIT) ×1
BIT DRL 3.2XCALB NS DISP (BIT) ×1
CLOSURE STERI-STRIP 1/2X4 (GAUZE/BANDAGES/DRESSINGS) ×1
CLOSURE WOUND 1/2 X4 (GAUZE/BANDAGES/DRESSINGS) ×1
CLSR STERI-STRIP ANTIMIC 1/2X4 (GAUZE/BANDAGES/DRESSINGS) ×2 IMPLANT
COVER PERINEAL POST (MISCELLANEOUS) ×3 IMPLANT
COVER SURGICAL LIGHT HANDLE (MISCELLANEOUS) ×3 IMPLANT
DRAPE C-ARM 42X72 X-RAY (DRAPES) ×3 IMPLANT
DRAPE IMP U-DRAPE 54X76 (DRAPES) ×3 IMPLANT
DRAPE INCISE IOBAN 66X45 STRL (DRAPES) ×3 IMPLANT
DRAPE STERI IOBAN 125X83 (DRAPES) ×3 IMPLANT
DRAPE U-SHAPE 47X51 STRL (DRAPES) ×3 IMPLANT
DRSG ADAPTIC 3X8 NADH LF (GAUZE/BANDAGES/DRESSINGS) ×2 IMPLANT
DRSG MEPILEX BORDER 4X4 (GAUZE/BANDAGES/DRESSINGS) ×4 IMPLANT
DRSG PAD ABDOMINAL 8X10 ST (GAUZE/BANDAGES/DRESSINGS) ×2 IMPLANT
DURAPREP 26ML APPLICATOR (WOUND CARE) ×3 IMPLANT
ELECT REM PT RETURN 9FT ADLT (ELECTROSURGICAL) ×3
ELECTRODE REM PT RTRN 9FT ADLT (ELECTROSURGICAL) ×1 IMPLANT
GAUZE SPONGE 4X4 12PLY STRL (GAUZE/BANDAGES/DRESSINGS) ×6 IMPLANT
GAUZE XEROFORM 1X8 LF (GAUZE/BANDAGES/DRESSINGS) ×3 IMPLANT
GLOVE BIO SURGEON STRL SZ7 (GLOVE) ×3 IMPLANT
GLOVE BIO SURGEON STRL SZ7.5 (GLOVE) ×8 IMPLANT
GLOVE BIOGEL PI IND STRL 6.5 (GLOVE) IMPLANT
GLOVE BIOGEL PI IND STRL 7.0 (GLOVE) ×1 IMPLANT
GLOVE BIOGEL PI IND STRL 8 (GLOVE) ×1 IMPLANT
GLOVE BIOGEL PI INDICATOR 6.5 (GLOVE) ×6
GLOVE BIOGEL PI INDICATOR 7.0 (GLOVE) ×2
GLOVE BIOGEL PI INDICATOR 8 (GLOVE) ×2
GLOVE BIOGEL PI ORTHO PRO SZ8 (GLOVE) ×2
GLOVE PI ORTHO PRO STRL SZ8 (GLOVE) ×1 IMPLANT
GLOVE SURG ORTHO 8.0 STRL STRW (GLOVE) ×8 IMPLANT
GLOVE SURG SS PI 6.5 STRL IVOR (GLOVE) ×4 IMPLANT
GOWN STRL REUS W/ TWL LRG LVL3 (GOWN DISPOSABLE) ×1 IMPLANT
GOWN STRL REUS W/ TWL XL LVL3 (GOWN DISPOSABLE) ×1 IMPLANT
GOWN STRL REUS W/TWL LRG LVL3 (GOWN DISPOSABLE) ×3
GOWN STRL REUS W/TWL XL LVL3 (GOWN DISPOSABLE) ×3
GUIDEROD T2 3X1000 (ROD) ×2 IMPLANT
K-WIRE  3.2X450M STR (WIRE) ×2
K-WIRE 2X5 SS THRDED S3 (WIRE) ×6
K-WIRE 3.2X450M STR (WIRE) ×1
KIT BASIN OR (CUSTOM PROCEDURE TRAY) ×3 IMPLANT
KIT NAIL LONG 10X140X125 (Nail) ×2 IMPLANT
KIT ROOM TURNOVER OR (KITS) ×3 IMPLANT
KWIRE 2X5 SS THRDED S3 (WIRE) IMPLANT
KWIRE 3.2X450M STR (WIRE) IMPLANT
MANIFOLD NEPTUNE II (INSTRUMENTS) ×3 IMPLANT
NDL HYPO 25GX1X1/2 BEV (NEEDLE) IMPLANT
NEEDLE HYPO 25GX1X1/2 BEV (NEEDLE) IMPLANT
NS IRRIG 1000ML POUR BTL (IV SOLUTION) ×3 IMPLANT
PACK GENERAL/GYN (CUSTOM PROCEDURE TRAY) ×3 IMPLANT
PACK SHOULDER (CUSTOM PROCEDURE TRAY) ×3 IMPLANT
PACK UNIVERSAL I (CUSTOM PROCEDURE TRAY) ×3 IMPLANT
PAD ARMBOARD 7.5X6 YLW CONV (MISCELLANEOUS) ×6 IMPLANT
PEG LOCKING 3.2MMX44 (Peg) ×2 IMPLANT
PEG LOCKING 3.2X36 (Screw) ×2 IMPLANT
PEG LOCKING 3.2X40 (Peg) ×4 IMPLANT
PEG LOCKING 3.2X50 (Screw) ×4 IMPLANT
PLATE PROX HUM LO L 7H 133 (Plate) ×2 IMPLANT
REAMER SHAFT BIXCUT (INSTRUMENTS) ×2 IMPLANT
SCREW CORTICAL LOW PROF 3.5X32 (Screw) ×4 IMPLANT
SCREW CORTICAL LOW PROF 3.5X34 (Screw) ×4 IMPLANT
SCREW LAG GAMMA 3 TI 10.5X100M (Screw) ×2 IMPLANT
SCREW LOCKING THREADED 5X47.5 (Screw) ×2 IMPLANT
SCREW LOW PROFILE 3.5X30MM TIS (Screw) ×2 IMPLANT
SCREW PEG LOCK 3.2X30MM (Screw) ×2 IMPLANT
SET IRRIG Y TYPE TUR BLADDER L (SET/KITS/TRAYS/PACK) ×2 IMPLANT
SLEEVE MEASURING 3.2 (BIT) ×2 IMPLANT
SLING ARM FOAM STRAP LRG (SOFTGOODS) ×2 IMPLANT
SPONGE LAP 4X18 X RAY DECT (DISPOSABLE) IMPLANT
STAPLER VISISTAT 35W (STAPLE) IMPLANT
STRIP CLOSURE SKIN 1/2X4 (GAUZE/BANDAGES/DRESSINGS) ×2 IMPLANT
SUCTION FRAZIER HANDLE 10FR (MISCELLANEOUS) ×2
SUCTION TUBE FRAZIER 10FR DISP (MISCELLANEOUS) ×1 IMPLANT
SUPPORT WRAP ARM LG (MISCELLANEOUS) ×3 IMPLANT
SUT FIBERWIRE #2 38 T-5 BLUE (SUTURE)
SUT MNCRL AB 4-0 PS2 18 (SUTURE) ×3 IMPLANT
SUT MON AB 2-0 CT1 27 (SUTURE) ×3 IMPLANT
SUT MON AB 2-0 CT1 36 (SUTURE) IMPLANT
SUT VIC AB 0 CT1 27 (SUTURE) ×3
SUT VIC AB 0 CT1 27XBRD ANBCTR (SUTURE) ×1 IMPLANT
SUT VIC AB 0 CTB1 27 (SUTURE) IMPLANT
SUTURE FIBERWR #2 38 T-5 BLUE (SUTURE) IMPLANT
SYR BULB IRRIGATION 50ML (SYRINGE) ×3 IMPLANT
SYR CONTROL 10ML LL (SYRINGE) IMPLANT
TOWEL OR 17X24 6PK STRL BLUE (TOWEL DISPOSABLE) ×3 IMPLANT
TOWEL OR 17X26 10 PK STRL BLUE (TOWEL DISPOSABLE) ×3 IMPLANT
TOWEL OR NON WOVEN STRL DISP B (DISPOSABLE) ×3 IMPLANT
TRAY FOLEY CATH SILVER 14FR (SET/KITS/TRAYS/PACK) ×2 IMPLANT
WATER STERILE IRR 1000ML POUR (IV SOLUTION) ×3 IMPLANT

## 2016-01-14 NOTE — ED Notes (Signed)
Pt taken to Xray with this RN. Pt states that he could not sit up for exam. Returned to room without xray. Casimiro Needle, [place] made aware

## 2016-01-14 NOTE — ED Notes (Signed)
Michael Jeffery, PA at bedside  

## 2016-01-14 NOTE — Op Note (Signed)
01/14/2016  4:19 PM  PATIENT:  Jeffery Bennett    PRE-OPERATIVE DIAGNOSIS:  left femur fx and left humerus fracture  POST-OPERATIVE DIAGNOSIS:  Same  PROCEDURE:  INTRAMEDULLARY (IM) NAIL FEMORAL, OPEN REDUCTION INTERNAL FIXATION (ORIF) PROXIMAL HUMERUS FRACTURE, IRRIGATION AND DEBRIDMENT WITH COMPLEX  WOUND CLOSURE   SURGEON:  Cielo Arias, Jewel Baize, MD  ASSISTANT: Janalee Dane, PA-C, She was present and scrubbed throughout the case, critical for completion in a timely fashion, and for retraction, instrumentation, and closure.   ANESTHESIA:   gen  PREOPERATIVE INDICATIONS:  Jeffery Bennett is a  35 y.o. male with a diagnosis of left femur fx and left humerus fracture who failed conservative measures and elected for surgical management.    The risks benefits and alternatives were discussed with the patient preoperatively including but not limited to the risks of infection, bleeding, nerve injury, cardiopulmonary complications, the need for revision surgery, among others, and the patient was willing to proceed.  OPERATIVE IMPLANTS: biomet Alps plate, Stryker Gamma nail  OPERATIVE FINDINGS: open fracture at proximal humerus  BLOOD LOSS: 300  COMPLICATIONS: none  TOURNIQUET TIME: none  OPERATIVE PROCEDURE:  Patient was identified in the preoperative holding area and site was marked by me He was transported to the operating theater and placed on the table in supine position taking care to pad all bony prominences. After a preincinduction time out anesthesia was induced. The left lower extremity was prepped and draped in normal sterile fashion and a pre-incision timeout was performed. He received ancef for preoperative antibiotics.   He is initially placed on the fracture table in the supine position all bony prominences were padded and he was prepped and draped in the normal sterile fashion. I then placed the fracture table in appropriate position with traction and internal rotation and  abduction affecting appropriate reduction.  I then made a superior incision over top of his greater trochanter inserted the guidepin. Placed it at the tip of the trochanter and was happy with its position in the femur on AP and lateral x-rays. I then placed the entry reamer and reamed the entry portion into the canal for the nail.  I then selected a 10 x 400 nail and inserted this under fluoroscopic guidance. As happy with the depth of the nail. I then made an incision for the lag screw and inserted the guide and placed the guidepin for the lag screw at took multiple x-rays and was happy with the center center position measured the depth and placed a 100 mm lag screw. I took final x-rays here and was happy with the position and reduction of fracture. Next I used a perfect circles technique and placed a distal interlock screw. I took final x-rays and was happy with the position of all hardware and fracture reduction.     I then placed sterile dressings after closing his incisions. He was then transferred to a regular bed and placed in the beachchair position again padding all bony prominences. The left upper extremity was then prepped and draped in normal sterile fashion. It was still within 2 hours of his Ancef.  Next I explored his wound that was roughly 4 cm. It did track down to his fracture. I then made a deltopectoral approach anterior to this down to his fracture site. I protected the cephalic vein. These to incision and the wound communicated I then delivered his bone ends debrided these and thoroughly irrigated his open fracture with 6 L of saline. I performed  a sharp debridement of his open fracture skin muscle and bone using forceps and scissors and a scalpel. As well as a curet.  Next I performed a repair performed a reduction of his fracture and clamped it in place. Selected an Alps plate of appropriate length and pinned it in place and took multiple x-rays was happy with the reduction as  well as the placement of the plate after some adjustments. I placed 2 proximal pegs after placing a lag screw to lag the plate down to the head. His tuberosities were stable it was a 2 part proximal humerus fracture.  I then reduced the plate down to the shaft I took multiple x-rays after pinning this into place I readjusted it once and then was very happy with her x-ray alignment. I placed distal screws distal the proximal humerus fracture.  He had a diaphyseal portion of his fracture is well I placed 3 screws distal to this portion of the fracture fixing his shaft fracture also. I then thoroughly irrigated his incision after taking multiple x-rays as happy with his alignment and placement of all hardware. I performed a complex closure of his 4 cm laceration and close his surgical incision. His placed in a sterile dressing and sling awoken and taken the PACU in stable condition.    POST OPERATIVE PLAN: NWB LUE, WBAT LLE    This note was generated using a template and dragon dictation system. In light of that, I have reviewed the note and all aspects of it are applicable to this case. Any dictation errors are due to the computerized dictation system.

## 2016-01-14 NOTE — ED Notes (Signed)
Wound to left upper arm irrigated with 2 liters NS.  Patient tolerated procedure well

## 2016-01-14 NOTE — Progress Notes (Signed)
Pt found not wearing C-collar. Explained importance of wearing collar until his C-spine is cleared. Pt refused to allow me to assist him to reapply.
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
## 2016-01-14 NOTE — ED Notes (Signed)
Pulses present in bilateral feet. Pt has sensation intact as well.

## 2016-01-14 NOTE — Anesthesia Procedure Notes (Signed)
Procedure Name: Intubation Date/Time: 01/14/2016 1:02 PM Performed by: Fabian November Pre-anesthesia Checklist: Patient identified, Patient being monitored, Timeout performed, Emergency Drugs available and Suction available Patient Re-evaluated:Patient Re-evaluated prior to inductionOxygen Delivery Method: Circle System Utilized Preoxygenation: Pre-oxygenation with 100% oxygen Intubation Type: IV induction, Rapid sequence and Cricoid Pressure applied Laryngoscope Size: 3 and Glidescope Grade View: Grade I Tube type: Oral Tube size: 8.0 mm Number of attempts: 1 Airway Equipment and Method: Stylet and Video-laryngoscopy Placement Confirmation: ETT inserted through vocal cords under direct vision,  positive ETCO2 and breath sounds checked- equal and bilateral Secured at: 23 cm Tube secured with: Tape Dental Injury: Teeth and Oropharynx as per pre-operative assessment  Comments: Pt remained in cervical collar. No movement of head or neck during intubation.
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
## 2016-01-14 NOTE — Progress Notes (Signed)
Security returned 2 cell phones to pt.
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
## 2016-01-14 NOTE — ED Notes (Signed)
Agustina Caroli [phone] :: EMERGENCY CONTACT

## 2016-01-14 NOTE — Progress Notes (Signed)
Pt refusing to allow staff to turn him. Unable to assess posterior at this point.

## 2016-01-14 NOTE — ED Notes (Signed)
Pt allowed to raise head of bed per michael, PA. pts dressing to left shoulder changed due to bleeding as well.

## 2016-01-14 NOTE — Progress Notes (Signed)
Cell phones given to wife.

## 2016-01-14 NOTE — Progress Notes (Signed)
Orthopedic Tech Progress Note Patient Details:  Jeffery Bennett 18-May-1981 [id number]  Ortho Devices Type of Ortho Device: Knee Immobilizer, Arm sling Ortho Device/Splint Location: arm sling lue, knee immobilizer lle Ortho Device/Splint Interventions: Ordered Verbal dr order for arm sling and knee immobilizer. Pt refused knee immobilizer. rn said pt. Will need stitches to shoulder wound to stop bleeding. Just to leave sling. Will call if needed.  Trinna Post 01/14/2016, 4:22 AM
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
## 2016-01-14 NOTE — Discharge Instructions (Signed)
Keep dressings clean and dry till follow up  Non-weight bearing in the L arm, weight bearing as tolerated in the L leg

## 2016-01-14 NOTE — ED Notes (Signed)
Dr. Eulah Pont has been contacted by EDP regarding pt.

## 2016-01-14 NOTE — ED Notes (Signed)
[employer] requests they be called before patient is discharged

## 2016-01-14 NOTE — Transfer of Care (Signed)
Immediate Anesthesia Transfer of Care Note  Patient: Jeffery Bennett  Procedure(s) Performed: Procedure(s): INTRAMEDULLARY (IM) NAIL FEMORAL (Left) OPEN REDUCTION INTERNAL FIXATION (ORIF) PROXIMAL HUMERUS FRACTURE, IRRIGATION AND DEBRIDMENT WITH COMPLEX  WOUND CLOSURE  (Left)  Patient Location: PACU  Anesthesia Type:General  Level of Consciousness: awake, alert  and patient cooperative  Airway & Oxygen Therapy: Patient Spontanous Breathing and Patient connected to nasal cannula oxygen  Post-op Assessment: Report given to RN, Post -op Vital signs reviewed and stable and Patient moving all extremities  Post vital signs: Reviewed and stable  Last Vitals:  Filed Vitals:   01/14/16 0900 01/14/16 0942  BP: 141/90 141/84  Pulse: 87 86  Temp:  37 C  Resp:  16    Complications: No apparent anesthesia complications

## 2016-01-14 NOTE — Progress Notes (Signed)
Pt refusing Dilaudid, requests Fentanyl. Dr. Janee Morn notified. Orders received.
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
## 2016-01-15 ENCOUNTER — Inpatient Hospital Stay (HOSPITAL_COMMUNITY): Payer: Self-pay

## 2016-01-15 ENCOUNTER — Encounter (HOSPITAL_COMMUNITY): Payer: Self-pay | Admitting: General Practice

## 2016-01-15 LAB — POCT I-STAT 4, (NA,K, GLUC, HGB,HCT)
GLUCOSE: 173 mg/dL — AB (ref 65–99)
HEMATOCRIT: 30 % — AB (ref 39.0–52.0)
Hemoglobin: 10.2 g/dL — ABNORMAL LOW (ref 13.0–17.0)
SODIUM: 129 mmol/L — AB (ref 135–145)

## 2016-01-15 LAB — CBC
HCT: 27 % — ABNORMAL LOW (ref 39.0–52.0)
HEMOGLOBIN: 9.3 g/dL — AB (ref 13.0–17.0)
MCH: 29 pg (ref 26.0–34.0)
MCHC: 34.4 g/dL (ref 30.0–36.0)
MCV: 84.1 fL (ref 78.0–100.0)
PLATELETS: 171 10*3/uL (ref 150–400)
RBC: 3.21 MIL/uL — AB (ref 4.22–5.81)
RDW: 13.8 % (ref 11.5–15.5)
WBC: 10.3 10*3/uL (ref 4.0–10.5)

## 2016-01-15 LAB — BASIC METABOLIC PANEL
Anion gap: 8 (ref 5–15)
BUN: 10 mg/dL (ref 6–20)
CHLORIDE: 99 mmol/L — AB (ref 101–111)
CO2: 27 mmol/L (ref 22–32)
CREATININE: 0.85 mg/dL (ref 0.61–1.24)
Calcium: 8.4 mg/dL — ABNORMAL LOW (ref 8.9–10.3)
GFR calc Af Amer: >60 mL/min (ref >60)
GFR calc non Af Amer: >60 mL/min (ref >60)
GLUCOSE: 144 mg/dL — AB (ref 65–99)
Potassium: 3.8 mmol/L (ref 3.5–5.1)
SODIUM: 134 mmol/L — AB (ref 135–145)

## 2016-01-15 MED ORDER — METHADONE HCL 10 MG PO TABS: 80.0000 mg | ORAL_TABLET | ORAL | Status: DC

## 2016-01-15 MED ORDER — OXYCODONE HCL 5 MG PO TABS
10.0000 mg | ORAL_TABLET | ORAL | Status: DC | PRN
  Administered 2016-01-15: 10 mg via ORAL
  Administered 2016-01-15: 20 mg via ORAL
  Administered 2016-01-15 (×2): 10 mg via ORAL
  Administered 2016-01-16 – 2016-01-21 (×27): 20 mg via ORAL
  Filled 2016-01-15 (×16): qty 4
  Filled 2016-01-15: qty 2
  Filled 2016-01-15 (×4): qty 4
  Filled 2016-01-15 (×2): qty 2
  Filled 2016-01-15 (×9): qty 4

## 2016-01-15 MED ORDER — TRAMADOL HCL 50 MG PO TABS
100.0000 mg | ORAL_TABLET | Freq: Four times a day (QID) | ORAL | Status: DC
  Administered 2016-01-15 – 2016-01-21 (×23): 100 mg via ORAL
  Filled 2016-01-15 (×24): qty 2

## 2016-01-15 MED ORDER — METHADONE HCL 10 MG PO TABS
80.0000 mg | ORAL_TABLET | Freq: Every day | ORAL | Status: DC
  Administered 2016-01-15 – 2016-01-16 (×2): 80 mg via ORAL
  Filled 2016-01-15 (×2): qty 8

## 2016-01-15 MED ORDER — FENTANYL CITRATE (PF) 100 MCG/2ML IJ SOLN
50.0000 ug | INTRAMUSCULAR | Status: DC | PRN
  Administered 2016-01-15 – 2016-01-21 (×30): 50 ug via INTRAVENOUS
  Filled 2016-01-15 (×31): qty 2

## 2016-01-15 NOTE — Anesthesia Postprocedure Evaluation (Signed)
Anesthesia Post Note  Patient: Jeffery Bennett  Procedure(s) Performed: Procedure(s) (LRB): INTRAMEDULLARY (IM) NAIL FEMORAL (Left) OPEN REDUCTION INTERNAL FIXATION (ORIF) PROXIMAL HUMERUS FRACTURE, IRRIGATION AND DEBRIDMENT WITH COMPLEX  WOUND CLOSURE  (Left)  Patient location during evaluation: PACU Anesthesia Type: General Level of consciousness: awake and alert Pain management: pain level controlled Vital Signs Assessment: post-procedure vital signs reviewed and stable Respiratory status: spontaneous breathing, nonlabored ventilation, respiratory function stable and patient connected to nasal cannula oxygen Cardiovascular status: blood pressure returned to baseline and stable Postop Assessment: no signs of nausea or vomiting Anesthetic complications: no    Last Vitals:  Filed Vitals:   01/15/16 1100 01/15/16 1434  BP:  141/95  Pulse: 124 116  Temp:  37 C  Resp:  18    Last Pain:  Filed Vitals:   01/15/16 1435  PainSc: 5                  Huan Pollok,JAMES TERRILL

## 2016-01-15 NOTE — Progress Notes (Signed)
OT Cancellation Note  Patient Details Name: Jeffery Bennett MRN: [id number] DOB: Jul 26, 1981   Cancelled Treatment:    Reason Eval/Treat Not Completed: Other (comment) (Pt declined OT evaluation secondary to reports of 10/10 pain.  RN notified.)  Lowella Grip, MS, OTR/L 01/15/2016, 11:07 AM

## 2016-01-15 NOTE — Progress Notes (Signed)
Pt. requested not to remove foley cath until after he speaks with MD this morning,request acknowledged and will report to next shift RN.

## 2016-01-15 NOTE — Progress Notes (Signed)
[place] [employer] in to check on pt's. status and to request dept. be notified of pt's. discharge.
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
## 2016-01-15 NOTE — Progress Notes (Signed)
Met with pt and his girlfriend of 12 years at bedside.  Girlfriend states she can assist pt at discharge.  PT recommending HH follow up at dc.  Will follow for orders.    Reinaldo Raddle, RN, BSN  Trauma/Neuro ICU Case Manager [phone]
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
## 2016-01-15 NOTE — Progress Notes (Signed)
PT Cancellation Note  Patient Details Name: Jeffery Bennett MRN: [id number] DOB: June 30, 1981   Cancelled Treatment:    Reason Eval/Treat Not Completed: Medical issues which prohibited therapy (pt with last K of >8.5 not addressed in notes or medication, await clarification of medical stability)   Toney Sang Beth 01/15/2016, 8:14 AM Delaney Meigs, PT [phone]

## 2016-01-16 LAB — CBC
HCT: 25.4 % — ABNORMAL LOW (ref 39.0–52.0)
HEMOGLOBIN: 8.6 g/dL — AB (ref 13.0–17.0)
MCH: 28.5 pg (ref 26.0–34.0)
MCHC: 33.9 g/dL (ref 30.0–36.0)
MCV: 84.1 fL (ref 78.0–100.0)
PLATELETS: 185 10*3/uL (ref 150–400)
RBC: 3.02 MIL/uL — AB (ref 4.22–5.81)
RDW: 13.8 % (ref 11.5–15.5)
WBC: 14.8 10*3/uL — AB (ref 4.0–10.5)

## 2016-01-16 MED ORDER — NICOTINE 21 MG/24HR TD PT24
21.0000 mg | MEDICATED_PATCH | Freq: Every day | TRANSDERMAL | Status: DC
  Administered 2016-01-16: 21 mg via TRANSDERMAL
  Filled 2016-01-16: qty 1
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
## 2016-01-17 ENCOUNTER — Inpatient Hospital Stay (HOSPITAL_COMMUNITY): Payer: Self-pay

## 2016-01-17 LAB — CBC
HEMATOCRIT: 22.2 % — AB (ref 39.0–52.0)
Hemoglobin: 7.6 g/dL — ABNORMAL LOW (ref 13.0–17.0)
MCH: 29 pg (ref 26.0–34.0)
MCHC: 34.2 g/dL (ref 30.0–36.0)
MCV: 84.7 fL (ref 78.0–100.0)
Platelets: 184 10*3/uL (ref 150–400)
RBC: 2.62 MIL/uL — ABNORMAL LOW (ref 4.22–5.81)
RDW: 14.1 % (ref 11.5–15.5)
WBC: 15.1 10*3/uL — AB (ref 4.0–10.5)

## 2016-01-17 MED ORDER — MIDAZOLAM HCL 2 MG/2ML IJ SOLN
INTRAMUSCULAR | Status: AC
  Administered 2016-01-17: 2 mg
  Filled 2016-01-17: qty 6

## 2016-01-17 MED ORDER — MIDAZOLAM HCL 2 MG/2ML IJ SOLN: 5.0000 mg | Freq: Once | INTRAMUSCULAR | Status: DC

## 2016-01-17 MED ORDER — FENTANYL CITRATE (PF) 100 MCG/2ML IJ SOLN
50.0000 ug | Freq: Once | INTRAMUSCULAR | Status: AC
  Administered 2016-01-17: 50 ug via INTRAVENOUS

## 2016-01-17 MED ORDER — MORPHINE SULFATE ER 15 MG PO TBCR
60.0000 mg | EXTENDED_RELEASE_TABLET | Freq: Two times a day (BID) | ORAL | Status: DC
Start: 2016-01-17 — End: 2016-01-18
  Administered 2016-01-17 (×2): 60 mg via ORAL
  Filled 2016-01-17 (×2): qty 4

## 2016-01-17 MED ORDER — LIDOCAINE-EPINEPHRINE 1 %-1:100000 IJ SOLN
30.0000 mL | Freq: Once | INTRAMUSCULAR | Status: AC
  Administered 2016-01-17: 30 mL
  Filled 2016-01-17: qty 30

## 2016-01-17 MED ORDER — FENTANYL CITRATE (PF) 100 MCG/2ML IJ SOLN
INTRAMUSCULAR | Status: AC
  Administered 2016-01-17: 100 ug
  Filled 2016-01-17: qty 2

## 2016-01-17 NOTE — Progress Notes (Signed)
Called to assist with CT insertion.  1147, IV fentanyl given and 2 mg IV versed given for CT insertion per MD., VSS 139/75,HR 95, RR 11, 98% on 2 lpm nasal cannula.  [id number],HR 96, 98% on 2lpm, RR 12, 1202 131/68, 96% , H?R 112, RR 14.  Patient communicating through out procedure, tolerated well.
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
## 2016-01-17 NOTE — Progress Notes (Signed)
MD paged due to patient stating he is not getting any relief from pain regimen. No new orders at this time. Will continue to monitor.

## 2016-01-17 NOTE — Progress Notes (Signed)
Patient requested a nicotine patch. MD made aware, no new orders at this time.
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
## 2016-01-18 ENCOUNTER — Inpatient Hospital Stay (HOSPITAL_COMMUNITY): Payer: Self-pay

## 2016-01-18 DIAGNOSIS — S270XXA Traumatic pneumothorax, initial encounter: Secondary | ICD-10-CM | POA: Diagnosis present

## 2016-01-18 DIAGNOSIS — F1910 Other psychoactive substance abuse, uncomplicated: Secondary | ICD-10-CM | POA: Diagnosis present

## 2016-01-18 DIAGNOSIS — S27322A Contusion of lung, bilateral, initial encounter: Secondary | ICD-10-CM | POA: Diagnosis present

## 2016-01-18 DIAGNOSIS — B1920 Unspecified viral hepatitis C without hepatic coma: Secondary | ICD-10-CM | POA: Insufficient documentation

## 2016-01-18 DIAGNOSIS — D62 Acute posthemorrhagic anemia: Secondary | ICD-10-CM | POA: Diagnosis not present

## 2016-01-18 DIAGNOSIS — S22009A Unspecified fracture of unspecified thoracic vertebra, initial encounter for closed fracture: Secondary | ICD-10-CM | POA: Diagnosis present

## 2016-01-18 MED ORDER — MORPHINE SULFATE ER 100 MG PO TBCR
100.0000 mg | EXTENDED_RELEASE_TABLET | Freq: Two times a day (BID) | ORAL | Status: DC
Start: 2016-01-18 — End: 2016-01-19
  Administered 2016-01-18 (×2): 100 mg via ORAL
  Filled 2016-01-18 (×2): qty 1

## 2016-01-18 NOTE — Progress Notes (Signed)
Patient refused repositioning and opportunities to get up out of bed all day. Educated multiple times regarding the importance of getting up.  States he has been up everyday and will try again tomorrow.
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
## 2016-01-18 NOTE — Progress Notes (Signed)
PT Cancellation Note  Patient Details Name: Jeffery Bennett MRN: [id number] DOB: [birth year]   Cancelled Treatment:    Reason Eval/Treat Not Completed: Patient declined. Pt reports he "just got comfortable" and refused to move because pain has been so bad. Pt educated on his need to move and its importance, but he still refused. Pt reports he will attempt to get up with OT after lunch.   [place], [place] [phone] office Chase Picket 01/18/2016, 12:03 PM
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
## 2016-01-18 NOTE — Progress Notes (Signed)
OT Cancellation Note  Patient Details Name: GURSHAAN MATSUOKA MRN: [id number] DOB: [birth year]   Cancelled Treatment:    Reason Eval/Treat Not Completed: Patient declined, no reason specified. Pt found supine in bed with girlfriend at bedside. Pt refused getting up with OT this afternoon, even after telling PT he would. Pt stated he was pain free and wanted to "just lay here". Therapist encouraged, stating getting up OOB everyday was important and because he didn't have the pain it was perfect opportunity to get up. Pt continuously refused. RN present during his refusal and aware of pt not getting up today. Will check back tomorrow for OT evaluation. Thank you for the order.  Edwin Cap , MS, OTR/L, CLT Pager: [phone]  01/18/2016, 3:02 PM

## 2016-01-19 ENCOUNTER — Inpatient Hospital Stay (HOSPITAL_COMMUNITY): Payer: Self-pay

## 2016-01-19 LAB — CBC
HCT: 21 % — ABNORMAL LOW (ref 39.0–52.0)
Hemoglobin: 7.1 g/dL — ABNORMAL LOW (ref 13.0–17.0)
MCH: 28.5 pg (ref 26.0–34.0)
MCHC: 33.8 g/dL (ref 30.0–36.0)
MCV: 84.3 fL (ref 78.0–100.0)
PLATELETS: 270 10*3/uL (ref 150–400)
RBC: 2.49 MIL/uL — AB (ref 4.22–5.81)
RDW: 14.4 % (ref 11.5–15.5)
WBC: 13.1 10*3/uL — ABNORMAL HIGH (ref 4.0–10.5)

## 2016-01-19 MED ORDER — MORPHINE SULFATE ER 15 MG PO TBCR
75.0000 mg | EXTENDED_RELEASE_TABLET | Freq: Three times a day (TID) | ORAL | Status: DC
  Administered 2016-01-19 – 2016-01-21 (×7): 75 mg via ORAL
  Filled 2016-01-19 (×7): qty 5

## 2016-01-19 NOTE — Progress Notes (Signed)
Pt refusing to allow nursing staff to turn him per order. Unable to assess posterior skin condition. Discussed risks of not participating in therapy, allowing nursing staff to assist him with ambulation. Will continue to encourage pt to mobilize.
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
## 2016-01-19 NOTE — Clinical Social Work Note (Signed)
CSW attempted to assess patient but patient care in progress. CSW will attempt to see patient at a later time.   Roddie Mc MSW, [place], [place], [phone]

## 2016-01-20 ENCOUNTER — Inpatient Hospital Stay (HOSPITAL_COMMUNITY): Payer: Self-pay

## 2016-01-20 LAB — CBC
HEMATOCRIT: 21.7 % — AB (ref 39.0–52.0)
Hemoglobin: 7.3 g/dL — ABNORMAL LOW (ref 13.0–17.0)
MCH: 28.3 pg (ref 26.0–34.0)
MCHC: 33.6 g/dL (ref 30.0–36.0)
MCV: 84.1 fL (ref 78.0–100.0)
Platelets: 343 10*3/uL (ref 150–400)
RBC: 2.58 MIL/uL — ABNORMAL LOW (ref 4.22–5.81)
RDW: 14.3 % (ref 11.5–15.5)
WBC: 10.5 10*3/uL (ref 4.0–10.5)

## 2016-01-20 NOTE — Progress Notes (Signed)
PT Cancellation Note  Patient Details Name: Jeffery Bennett MRN: [id number] DOB: 1981/09/11   Cancelled Treatment:    Reason Eval/Treat Not Completed: Patient declined.  Pt refusing OOB mobility at this time.  I did get a little more information about d/c destination.  He plans to go to his mother in law's house which has a WC ramp and she has a WC for him to use.  So, he essentially will at a minimum only need to be able to transfer with assistance to physically be able to d/c (which he is doing).  He will need a hemi walker for d/c and HHPT if he qualifies.  Left leg exercise handout left in room.  Pt is agreeable for PT to come back later this afternoon, "I won't disappoint you or myself.  I know I need to get up."   Panayiotis [place] B. Alexyss Balzarini, PT, DPT [phone]   01/20/2016, 11:56 AM
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
## 2016-01-21 ENCOUNTER — Telehealth: Payer: Self-pay | Admitting: Surgery

## 2016-01-21 ENCOUNTER — Inpatient Hospital Stay (HOSPITAL_COMMUNITY): Payer: MEDICAID

## 2016-01-21 ENCOUNTER — Encounter (HOSPITAL_COMMUNITY): Payer: Self-pay | Admitting: *Deleted

## 2016-01-21 LAB — CREATININE, SERUM
CREATININE: 0.63 mg/dL (ref 0.61–1.24)
GFR calc Af Amer: >60 mL/min (ref >60)
GFR calc non Af Amer: >60 mL/min (ref >60)

## 2016-01-21 MED ORDER — METHOCARBAMOL 500 MG PO TABS: 500.0000 mg | ORAL_TABLET | Freq: Four times a day (QID) | ORAL | Status: DC | PRN

## 2016-01-21 MED ORDER — MORPHINE SULFATE ER 15 MG PO TBCR: 75.0000 mg | EXTENDED_RELEASE_TABLET | Freq: Three times a day (TID) | ORAL | Status: DC

## 2016-01-21 MED ORDER — OXYCODONE-ACETAMINOPHEN 10-325 MG PO TABS: 1.0000 | ORAL_TABLET | ORAL | Status: DC | PRN

## 2016-01-21 MED ORDER — TRAMADOL HCL 50 MG PO TABS: 100.0000 mg | ORAL_TABLET | Freq: Four times a day (QID) | ORAL | Status: DC

## 2016-01-21 MED ORDER — OXYCODONE HCL 10 MG PO TABS: 10.0000 mg | ORAL_TABLET | ORAL | Status: DC | PRN

## 2016-01-21 NOTE — Care Management Note (Signed)
Case Management Note  Patient Details  Name: NAGEE GOATES MRN: [id number] Date of Birth: 03/17/1981  Subjective/Objective:   Pt for dc home today with girlfriend.  PT/OT recommending HH follow up, and pt has qualifying dx for [facility] services with self pay status.  Pt uninsured, but is eligible for medication assistance with [facility] program.                   Action/Plan: Referral to [facility] for home health PT/OT and DME under charity care.  MATCH letter given to pt, with explanation of program benefits.  Start of care for [facility] 24-48h post dc date.  DME to be delivered to pt's room prior to dc home.   Expected Discharge Date:  01/21/2016               Expected Discharge Plan:  Home w Home Health Services  In-House Referral:     Discharge planning Services  CM Consult  Post Acute Care Choice:  Home Health Choice offered to:  Patient  DME Arranged:  3-N-1, Walker hemi DME Agency:  [facility]  HH Arranged:  PT, OT [facility] Agency:  [facility]  Status of Service:  Completed, signed off  Medicare Important Message Given:    Date Medicare IM Given:    Medicare IM give by:    Date Additional Medicare IM Given:    Additional Medicare Important Message give by:     If discussed at Long Length of Stay Meetings, dates discussed:    Additional Comments:  Quintella Baton, RN, BSN  Trauma/Neuro ICU Case Manager [phone]

## 2016-01-21 NOTE — Progress Notes (Signed)
Orthopedic Tech Progress Note Patient Details:  Jeffery Bennett November 22, 1981 [id number]  Patient ID: Jeffery Bennett, male   DOB: June 17, 1981, 35 y.o.   MRN: [id number] [facility]   Trinna Post 01/21/2016, 6:14 AM

## 2016-01-21 NOTE — Telephone Encounter (Signed)
ED CM received call from [facility] concerning MATCH letter. He reports not being able to process medication with [facility] letter. This CM reviewed PDMI information. reprocessed the patient's information  in PDMI. Pharmacist was able to process for [facility] for medication assistance. No further ED CM needs identified
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
## 2016-01-21 NOTE — Progress Notes (Signed)
OT Cancellation Note  Patient Details Name: [name] MRN: [id number] DOB: [birth year]   Cancelled Treatment:    Reason Eval/Treat Not Completed: Pain limiting ability to participate Pt declined due to pain. Discussed with nsg. awaiting pain meds being sent from pharmacy. Will return later is able. Encouraged pt to use ice packs to help with pain control.  [facility] [place], OTR/L  [phone] 01/21/2016 01/21/2016, 10:52 AM

## 2016-01-21 NOTE — Progress Notes (Signed)
PT Cancellation Note  Patient Details Name: Jeffery Bennett MRN: [id number] DOB: 13-Oct-1981   Cancelled Treatment:    Reason Eval/Treat Not Completed: Pt reports Jeffery Bennett is waiting on his oxy IR before Jeffery Bennett will get up.  RN working with pharmacy on getting it ASAP.  PT will f/u later today.  Thanks,    Jeffery Bennett. Jeffery Bennett, PT, DPT [phone]   01/21/2016, 11:00 AM

## 2016-01-25 ENCOUNTER — Telehealth (HOSPITAL_COMMUNITY): Payer: Self-pay | Admitting: Orthopedic Surgery

## 2016-01-26 NOTE — Telephone Encounter (Signed)
Ok'd SW consult and suggested mag citrate q12h x3 and call if no results.

## 2016-03-28 ENCOUNTER — Ambulatory Visit: Payer: No Typology Code available for payment source | Attending: Orthopedic Surgery | Admitting: Physical Therapy

## 2016-04-04 ENCOUNTER — Ambulatory Visit: Payer: No Typology Code available for payment source | Admitting: Physical Therapy

## 2016-06-22 ENCOUNTER — Encounter (HOSPITAL_COMMUNITY): Payer: Self-pay

## 2016-06-22 ENCOUNTER — Emergency Department (HOSPITAL_COMMUNITY)
Admission: EM | Admit: 2016-06-22 | Discharge: 2016-06-22 | Disposition: A | Discharge status: Home / Self Care | Payer: No Typology Code available for payment source | Attending: Emergency Medicine | Admitting: Emergency Medicine

## 2016-06-22 DIAGNOSIS — K047 Periapical abscess without sinus: Secondary | ICD-10-CM | POA: Insufficient documentation

## 2016-06-22 DIAGNOSIS — F17210 Nicotine dependence, cigarettes, uncomplicated: Secondary | ICD-10-CM | POA: Insufficient documentation

## 2016-06-22 MED ORDER — HYDROCODONE-ACETAMINOPHEN 5-325 MG PO TABS: 1.0000 | ORAL_TABLET | Freq: Four times a day (QID) | ORAL | Status: DC | PRN

## 2016-06-22 MED ORDER — CHLORHEXIDINE GLUCONATE 0.12 % MT SOLN: 15.0000 mL | Freq: Two times a day (BID) | OROMUCOSAL | Status: DC

## 2016-06-22 MED ORDER — PENICILLIN V POTASSIUM 500 MG PO TABS: 500.0000 mg | ORAL_TABLET | Freq: Four times a day (QID) | ORAL | Status: DC

## 2016-06-22 NOTE — ED Notes (Signed)
Patient here with right sided lower dental pain and swelling x 1 day, reports broken tooth
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
## 2016-06-22 NOTE — Discharge Instructions (Signed)

## 2016-06-22 NOTE — ED Notes (Addendum)
Declined W/C at D/C and was escorted to lobby by RN. 

## 2016-09-25 ENCOUNTER — Emergency Department (HOSPITAL_COMMUNITY)
Admission: EM | Admit: 2016-09-25 | Discharge: 2016-09-25 | Disposition: A | Discharge status: Home / Self Care | Payer: Self-pay | Attending: Emergency Medicine | Admitting: Emergency Medicine

## 2016-09-25 ENCOUNTER — Emergency Department (HOSPITAL_COMMUNITY): Payer: Self-pay

## 2016-09-25 ENCOUNTER — Encounter (HOSPITAL_COMMUNITY): Payer: Self-pay | Admitting: *Deleted

## 2016-09-25 DIAGNOSIS — L237 Allergic contact dermatitis due to plants, except food: Secondary | ICD-10-CM | POA: Insufficient documentation

## 2016-09-25 DIAGNOSIS — F17210 Nicotine dependence, cigarettes, uncomplicated: Secondary | ICD-10-CM | POA: Insufficient documentation

## 2016-09-25 DIAGNOSIS — Y929 Unspecified place or not applicable: Secondary | ICD-10-CM | POA: Insufficient documentation

## 2016-09-25 DIAGNOSIS — Y939 Activity, unspecified: Secondary | ICD-10-CM | POA: Insufficient documentation

## 2016-09-25 DIAGNOSIS — Y990 Civilian activity done for income or pay: Secondary | ICD-10-CM | POA: Insufficient documentation

## 2016-09-25 DIAGNOSIS — M25462 Effusion, left knee: Secondary | ICD-10-CM | POA: Insufficient documentation

## 2016-09-25 DIAGNOSIS — W1842XA Slipping, tripping and stumbling without falling due to stepping into hole or opening, initial encounter: Secondary | ICD-10-CM | POA: Insufficient documentation

## 2016-09-25 MED ORDER — OXYCODONE-ACETAMINOPHEN 5-325 MG PO TABS
1.0000 | ORAL_TABLET | Freq: Once | ORAL | Status: AC
  Administered 2016-09-25: 1 via ORAL
  Filled 2016-09-25: qty 1

## 2016-09-25 MED ORDER — HYDROXYZINE HCL 10 MG PO TABS: 10.0000 mg | ORAL_TABLET | Freq: Four times a day (QID) | ORAL | 0 refills | Status: DC | PRN

## 2016-09-25 MED ORDER — DIPHENHYDRAMINE HCL 25 MG PO CAPS
25.0000 mg | ORAL_CAPSULE | Freq: Once | ORAL | Status: AC
Start: 2016-09-25 — End: 2016-09-25
  Administered 2016-09-25: 25 mg via ORAL
  Filled 2016-09-25: qty 1

## 2016-09-25 MED ORDER — OXYCODONE-ACETAMINOPHEN 5-325 MG PO TABS: 1.0000 | ORAL_TABLET | ORAL | 0 refills | Status: DC | PRN

## 2016-09-25 MED ORDER — CLOBETASOL PROPIONATE 0.05 % EX CREA
1.0000 | TOPICAL_CREAM | Freq: Two times a day (BID) | CUTANEOUS | 0 refills | Status: DC
Start: 2016-09-25 — End: 2016-10-14

## 2016-09-25 MED ORDER — PREDNISONE 20 MG PO TABS
60.0000 mg | ORAL_TABLET | Freq: Once | ORAL | Status: AC
  Administered 2016-09-25: 60 mg via ORAL
  Filled 2016-09-25: qty 3

## 2016-09-25 MED ORDER — PREDNISONE 10 MG PO TABS: 20.0000 mg | ORAL_TABLET | Freq: Every day | ORAL | 0 refills | Status: DC

## 2016-09-25 NOTE — Discharge Instructions (Signed)
Take your medications as prescribed. Call Dr. Greig RightMurphy's office tomorrow morning to schedule a follow-up appointment for this week. Follow-up with the primary care provider's office in the next week if your rash has not improved. He may also take Benadryl as needed for relief of itching as prescribed over-the-counter. Please return to the Emergency Department if symptoms worsen or new onset of fever, chest pain, sensation of throat closing, difficulty breathing, drainage, redness, swelling, numbness, tingling, weakness.
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
## 2016-09-25 NOTE — ED Notes (Signed)
Declined W/C at D/C and was escorted to lobby by RN. 

## 2016-09-25 NOTE — ED Triage Notes (Signed)
PT name called on two separate times no answer.

## 2016-09-25 NOTE — ED Triage Notes (Signed)
The pt is here for knee swelling  Lt  And poison ivy all over her body for a week

## 2016-10-14 ENCOUNTER — Emergency Department (HOSPITAL_COMMUNITY)
Admission: EM | Admit: 2016-10-14 | Discharge: 2016-10-14 | Disposition: A | Discharge status: Home / Self Care | Payer: Self-pay | Attending: Emergency Medicine | Admitting: Emergency Medicine

## 2016-10-14 ENCOUNTER — Emergency Department (HOSPITAL_COMMUNITY): Payer: Self-pay

## 2016-10-14 DIAGNOSIS — M25461 Effusion, right knee: Secondary | ICD-10-CM | POA: Insufficient documentation

## 2016-10-14 DIAGNOSIS — Y999 Unspecified external cause status: Secondary | ICD-10-CM | POA: Insufficient documentation

## 2016-10-14 DIAGNOSIS — F17210 Nicotine dependence, cigarettes, uncomplicated: Secondary | ICD-10-CM | POA: Insufficient documentation

## 2016-10-14 DIAGNOSIS — Y9339 Activity, other involving climbing, rappelling and jumping off: Secondary | ICD-10-CM | POA: Insufficient documentation

## 2016-10-14 DIAGNOSIS — Y30XXXA Falling, jumping or pushed from a high place, undetermined intent, initial encounter: Secondary | ICD-10-CM | POA: Insufficient documentation

## 2016-10-14 DIAGNOSIS — Y929 Unspecified place or not applicable: Secondary | ICD-10-CM | POA: Insufficient documentation

## 2016-10-14 MED ORDER — ACETAMINOPHEN 500 MG PO TABS
1000.0000 mg | ORAL_TABLET | Freq: Once | ORAL | Status: AC
  Administered 2016-10-14: 1000 mg via ORAL
  Filled 2016-10-14: qty 2

## 2016-10-14 MED ORDER — HYDROCODONE-ACETAMINOPHEN 5-325 MG PO TABS: 1.0000 | ORAL_TABLET | Freq: Once | ORAL | Status: DC

## 2016-10-14 MED ORDER — IBUPROFEN 600 MG PO TABS: 600.0000 mg | ORAL_TABLET | Freq: Three times a day (TID) | ORAL | 0 refills | Status: DC | PRN

## 2016-10-14 MED ORDER — KETOROLAC TROMETHAMINE 60 MG/2ML IM SOLN
60.0000 mg | Freq: Once | INTRAMUSCULAR | Status: AC
  Administered 2016-10-14: 60 mg via INTRAMUSCULAR
  Filled 2016-10-14: qty 2

## 2016-10-14 MED ORDER — IBUPROFEN 400 MG PO TABS: 600.0000 mg | ORAL_TABLET | Freq: Once | ORAL | Status: DC

## 2016-10-14 NOTE — ED Triage Notes (Signed)
Pt from home. Sts he jumped off a wall and landed on his leg wrong. Obvious swelling noted to R knee. Rates pain @ 10/10.

## 2016-10-14 NOTE — ED Notes (Signed)
Patient threw discharge instruction and Ibuprofen prescription into trash can and exited department stating he was angry that "I fucking need something stronger than that.  I've been taking tylenol all night."

## 2016-10-14 NOTE — Progress Notes (Signed)
Orthopedic Tech Progress Note Patient Details:  Devoria AlbeBrian K Fugitt 1981-10-28 [id number]  Patient ID: Devoria AlbeBrian K Cowdrey, male   DOB: 1981-10-28, 35 y.o.   MRN: [id number]   Nikki DomCrawford, Lawsen Arnott 10/14/2016, 7:40 AM Pt refused knee immobilizer; RN notified

## 2016-10-14 NOTE — ED Notes (Signed)
Patient refused knee immobilizer stating he has one at home.
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
## 2016-10-14 NOTE — ED Notes (Signed)
Ortho tech paged for knee immobilizer 

## 2016-10-14 NOTE — ED Notes (Signed)
Patient transported to X-ray 

## 2016-11-10 ENCOUNTER — Inpatient Hospital Stay (HOSPITAL_COMMUNITY): Payer: Self-pay | Admitting: Certified Registered"

## 2016-11-10 ENCOUNTER — Encounter (HOSPITAL_COMMUNITY)
Admission: EM | Discharge status: Against Medical Advice | Payer: Self-pay | Source: Home / Self Care | Attending: Family Medicine

## 2016-11-10 ENCOUNTER — Inpatient Hospital Stay (HOSPITAL_COMMUNITY)
Admission: EM | Admit: 2016-11-10 | Discharge: 2016-11-13 | DRG: 572 | Discharge status: Against Medical Advice | Payer: Self-pay | Attending: Family Medicine | Admitting: Family Medicine

## 2016-11-10 ENCOUNTER — Emergency Department (HOSPITAL_COMMUNITY): Payer: Self-pay

## 2016-11-10 ENCOUNTER — Encounter (HOSPITAL_COMMUNITY): Payer: Self-pay | Admitting: Emergency Medicine

## 2016-11-10 DIAGNOSIS — F1110 Opioid abuse, uncomplicated: Secondary | ICD-10-CM | POA: Diagnosis present

## 2016-11-10 DIAGNOSIS — L02419 Cutaneous abscess of limb, unspecified: Secondary | ICD-10-CM

## 2016-11-10 DIAGNOSIS — R52 Pain, unspecified: Secondary | ICD-10-CM

## 2016-11-10 DIAGNOSIS — F1410 Cocaine abuse, uncomplicated: Secondary | ICD-10-CM | POA: Diagnosis present

## 2016-11-10 DIAGNOSIS — B1920 Unspecified viral hepatitis C without hepatic coma: Secondary | ICD-10-CM | POA: Diagnosis present

## 2016-11-10 DIAGNOSIS — A4902 Methicillin resistant Staphylococcus aureus infection, unspecified site: Secondary | ICD-10-CM | POA: Diagnosis present

## 2016-11-10 DIAGNOSIS — L02414 Cutaneous abscess of left upper limb: Principal | ICD-10-CM | POA: Diagnosis present

## 2016-11-10 DIAGNOSIS — E876 Hypokalemia: Secondary | ICD-10-CM | POA: Diagnosis not present

## 2016-11-10 DIAGNOSIS — F17210 Nicotine dependence, cigarettes, uncomplicated: Secondary | ICD-10-CM | POA: Diagnosis present

## 2016-11-10 DIAGNOSIS — F1910 Other psychoactive substance abuse, uncomplicated: Secondary | ICD-10-CM | POA: Diagnosis present

## 2016-11-10 DIAGNOSIS — L0291 Cutaneous abscess, unspecified: Secondary | ICD-10-CM | POA: Diagnosis present

## 2016-11-10 DIAGNOSIS — F1210 Cannabis abuse, uncomplicated: Secondary | ICD-10-CM | POA: Diagnosis present

## 2016-11-10 HISTORY — PX: I & D EXTREMITY: SHX5045

## 2016-11-10 LAB — CBC WITH DIFFERENTIAL/PLATELET
BASOS ABS: 0 10*3/uL (ref 0.0–0.1)
BASOS PCT: 0 %
EOS ABS: 0.1 10*3/uL (ref 0.0–0.7)
EOS PCT: 1 %
HCT: 38.9 % — ABNORMAL LOW (ref 39.0–52.0)
Hemoglobin: 12.9 g/dL — ABNORMAL LOW (ref 13.0–17.0)
Lymphocytes Relative: 9 %
Lymphs Abs: 1.3 10*3/uL (ref 0.7–4.0)
MCH: 27.4 pg (ref 26.0–34.0)
MCHC: 33.2 g/dL (ref 30.0–36.0)
MCV: 82.8 fL (ref 78.0–100.0)
MONO ABS: 1.2 10*3/uL — AB (ref 0.1–1.0)
Monocytes Relative: 8 %
Neutro Abs: 11.9 10*3/uL — ABNORMAL HIGH (ref 1.7–7.7)
Neutrophils Relative %: 82 %
PLATELETS: 342 10*3/uL (ref 150–400)
RBC: 4.7 MIL/uL (ref 4.22–5.81)
RDW: 13.8 % (ref 11.5–15.5)
WBC: 14.4 10*3/uL — ABNORMAL HIGH (ref 4.0–10.5)

## 2016-11-10 LAB — COMPREHENSIVE METABOLIC PANEL
ALBUMIN: 3.1 g/dL — AB (ref 3.5–5.0)
ALK PHOS: 76 U/L (ref 38–126)
ALT: 18 U/L (ref 17–63)
AST: 20 U/L (ref 15–41)
Anion gap: 8 (ref 5–15)
BILIRUBIN TOTAL: 0.3 mg/dL (ref 0.3–1.2)
BUN: 6 mg/dL (ref 6–20)
CALCIUM: 9.1 mg/dL (ref 8.9–10.3)
CO2: 26 mmol/L (ref 22–32)
CREATININE: 0.87 mg/dL (ref 0.61–1.24)
Chloride: 99 mmol/L — ABNORMAL LOW (ref 101–111)
GFR calc Af Amer: >60 mL/min (ref >60)
GLUCOSE: 110 mg/dL — AB (ref 65–99)
Potassium: 3.7 mmol/L (ref 3.5–5.1)
Sodium: 133 mmol/L — ABNORMAL LOW (ref 135–145)
TOTAL PROTEIN: 7.3 g/dL (ref 6.5–8.1)

## 2016-11-10 LAB — I-STAT CG4 LACTIC ACID, ED: Lactic Acid, Venous: 0.73 mmol/L (ref 0.5–1.9)

## 2016-11-10 SURGERY — IRRIGATION AND DEBRIDEMENT EXTREMITY
Anesthesia: General | Site: Arm Upper | Laterality: Left

## 2016-11-10 MED ORDER — PROMETHAZINE HCL 25 MG/ML IJ SOLN: 6.2500 mg | INTRAMUSCULAR | Status: DC | PRN

## 2016-11-10 MED ORDER — SODIUM CHLORIDE 0.9 % IJ SOLN
INTRAMUSCULAR | Status: AC
  Filled 2016-11-10: qty 10

## 2016-11-10 MED ORDER — HYDROMORPHONE HCL 1 MG/ML IJ SOLN
INTRAMUSCULAR | Status: AC
  Administered 2016-11-10: 0.5 mg via INTRAVENOUS
  Filled 2016-11-10: qty 0.5

## 2016-11-10 MED ORDER — SODIUM CHLORIDE 0.9 % IV SOLN: INTRAVENOUS | Status: DC

## 2016-11-10 MED ORDER — MIDAZOLAM HCL 2 MG/2ML IJ SOLN: 0.5000 mg | Freq: Once | INTRAMUSCULAR | Status: DC | PRN

## 2016-11-10 MED ORDER — CHLORHEXIDINE GLUCONATE 4 % EX LIQD: 60.0000 mL | Freq: Once | CUTANEOUS | Status: DC

## 2016-11-10 MED ORDER — IOPAMIDOL (ISOVUE-300) INJECTION 61%
INTRAVENOUS | Status: AC
  Administered 2016-11-10: 100 mL
  Filled 2016-11-10: qty 100

## 2016-11-10 MED ORDER — POVIDONE-IODINE 10 % EX SWAB: 2.0000 "application " | Freq: Once | CUTANEOUS | Status: DC

## 2016-11-10 MED ORDER — VANCOMYCIN HCL IN DEXTROSE 1-5 GM/200ML-% IV SOLN
1000.0000 mg | Freq: Three times a day (TID) | INTRAVENOUS | Status: DC
  Administered 2016-11-11 – 2016-11-12 (×5): 1000 mg via INTRAVENOUS
  Filled 2016-11-10 (×10): qty 200

## 2016-11-10 MED ORDER — VANCOMYCIN HCL IN DEXTROSE 1-5 GM/200ML-% IV SOLN
1000.0000 mg | Freq: Once | INTRAVENOUS | Status: AC
  Administered 2016-11-10: 1000 mg via INTRAVENOUS
  Filled 2016-11-10: qty 200

## 2016-11-10 MED ORDER — ONDANSETRON HCL 4 MG/2ML IJ SOLN: 4.0000 mg | Freq: Four times a day (QID) | INTRAMUSCULAR | Status: DC | PRN

## 2016-11-10 MED ORDER — ACETAMINOPHEN 325 MG PO TABS: 650.0000 mg | ORAL_TABLET | Freq: Four times a day (QID) | ORAL | Status: DC | PRN

## 2016-11-10 MED ORDER — HYDROMORPHONE HCL 2 MG/ML IJ SOLN: 0.5000 mg | INTRAMUSCULAR | Status: DC | PRN

## 2016-11-10 MED ORDER — OXYCODONE HCL 5 MG PO TABS
5.0000 mg | ORAL_TABLET | ORAL | Status: DC | PRN
  Administered 2016-11-10 – 2016-11-11 (×2): 10 mg via ORAL
  Administered 2016-11-11: 5 mg via ORAL
  Filled 2016-11-10 (×2): qty 2

## 2016-11-10 MED ORDER — OXYCODONE HCL 5 MG PO TABS
ORAL_TABLET | ORAL | Status: AC
  Filled 2016-11-10: qty 2

## 2016-11-10 MED ORDER — ACETAMINOPHEN 500 MG PO TABS
ORAL_TABLET | ORAL | Status: AC
  Filled 2016-11-10: qty 2

## 2016-11-10 MED ORDER — ACETAMINOPHEN 650 MG RE SUPP: 650.0000 mg | Freq: Four times a day (QID) | RECTAL | Status: DC | PRN

## 2016-11-10 MED ORDER — HYDROMORPHONE HCL 1 MG/ML IJ SOLN
INTRAMUSCULAR | Status: AC
  Filled 2016-11-10: qty 0.5

## 2016-11-10 MED ORDER — SUCCINYLCHOLINE CHLORIDE 200 MG/10ML IV SOSY
PREFILLED_SYRINGE | INTRAVENOUS | Status: DC | PRN
  Administered 2016-11-10: 120 mg via INTRAVENOUS

## 2016-11-10 MED ORDER — DEXAMETHASONE SODIUM PHOSPHATE 10 MG/ML IJ SOLN
INTRAMUSCULAR | Status: AC
  Filled 2016-11-10: qty 1

## 2016-11-10 MED ORDER — DEXAMETHASONE SODIUM PHOSPHATE 10 MG/ML IJ SOLN
INTRAMUSCULAR | Status: DC | PRN
  Administered 2016-11-10: 10 mg via INTRAVENOUS

## 2016-11-10 MED ORDER — ONDANSETRON HCL 4 MG/2ML IJ SOLN
INTRAMUSCULAR | Status: AC
  Filled 2016-11-10: qty 2

## 2016-11-10 MED ORDER — HYDROMORPHONE HCL 1 MG/ML IJ SOLN
0.2500 mg | INTRAMUSCULAR | Status: DC | PRN
Start: 2016-11-10 — End: 2016-11-10
  Administered 2016-11-10 (×4): 0.5 mg via INTRAVENOUS

## 2016-11-10 MED ORDER — SUFENTANIL CITRATE 50 MCG/ML IV SOLN
INTRAVENOUS | Status: AC
  Filled 2016-11-10: qty 1

## 2016-11-10 MED ORDER — PIPERACILLIN-TAZOBACTAM 3.375 G IVPB 30 MIN
3.3750 g | INTRAVENOUS | Status: AC
  Administered 2016-11-11: 3.375 g via INTRAVENOUS
  Filled 2016-11-10: qty 50

## 2016-11-10 MED ORDER — CEFAZOLIN SODIUM 1 G IJ SOLR
INTRAMUSCULAR | Status: AC
  Filled 2016-11-10: qty 20

## 2016-11-10 MED ORDER — ONDANSETRON HCL 4 MG/2ML IJ SOLN
INTRAMUSCULAR | Status: DC | PRN
  Administered 2016-11-10: 4 mg via INTRAVENOUS

## 2016-11-10 MED ORDER — ONDANSETRON HCL 4 MG PO TABS: 4.0000 mg | ORAL_TABLET | Freq: Four times a day (QID) | ORAL | Status: DC | PRN

## 2016-11-10 MED ORDER — KETAMINE HCL-SODIUM CHLORIDE 100-0.9 MG/10ML-% IV SOSY
PREFILLED_SYRINGE | INTRAVENOUS | Status: AC
  Filled 2016-11-10: qty 10

## 2016-11-10 MED ORDER — LACTATED RINGERS IV SOLN
INTRAVENOUS | Status: DC | PRN
  Administered 2016-11-10 (×2): via INTRAVENOUS

## 2016-11-10 MED ORDER — KETAMINE HCL 10 MG/ML IJ SOLN
INTRAMUSCULAR | Status: DC | PRN
  Administered 2016-11-10: 50 mg via INTRAVENOUS

## 2016-11-10 MED ORDER — CEFAZOLIN SODIUM 1 G IJ SOLR
INTRAMUSCULAR | Status: DC | PRN
  Administered 2016-11-10: 2 g via INTRAMUSCULAR

## 2016-11-10 MED ORDER — SUCCINYLCHOLINE CHLORIDE 200 MG/10ML IV SOSY
PREFILLED_SYRINGE | INTRAVENOUS | Status: AC
  Filled 2016-11-10: qty 10

## 2016-11-10 MED ORDER — CEFAZOLIN SODIUM-DEXTROSE 2-4 GM/100ML-% IV SOLN: 2.0000 g | INTRAVENOUS | Status: DC

## 2016-11-10 MED ORDER — LIDOCAINE 2% (20 MG/ML) 5 ML SYRINGE
INTRAMUSCULAR | Status: AC
  Filled 2016-11-10: qty 5

## 2016-11-10 MED ORDER — MIDAZOLAM HCL 5 MG/5ML IJ SOLN
INTRAMUSCULAR | Status: DC | PRN
  Administered 2016-11-10: 2 mg via INTRAVENOUS

## 2016-11-10 MED ORDER — ACETAMINOPHEN 325 MG PO TABS
650.0000 mg | ORAL_TABLET | Freq: Once | ORAL | Status: DC
  Administered 2016-11-10: 1000 mg via ORAL

## 2016-11-10 MED ORDER — MIDAZOLAM HCL 2 MG/2ML IJ SOLN
INTRAMUSCULAR | Status: AC
  Filled 2016-11-10: qty 2

## 2016-11-10 MED ORDER — LIDOCAINE 2% (20 MG/ML) 5 ML SYRINGE
INTRAMUSCULAR | Status: DC | PRN
  Administered 2016-11-10: 100 mg via INTRAVENOUS

## 2016-11-10 MED ORDER — MEPERIDINE HCL 25 MG/ML IJ SOLN: 6.2500 mg | INTRAMUSCULAR | Status: DC | PRN

## 2016-11-10 MED ORDER — PIPERACILLIN-TAZOBACTAM 3.375 G IVPB
3.3750 g | Freq: Three times a day (TID) | INTRAVENOUS | Status: DC
  Administered 2016-11-11 – 2016-11-12 (×4): 3.375 g via INTRAVENOUS
  Filled 2016-11-10 (×9): qty 50

## 2016-11-10 MED ORDER — SODIUM CHLORIDE 0.9 % IR SOLN
Status: DC | PRN
  Administered 2016-11-10: 1000 mL
  Administered 2016-11-10: 3000 mL

## 2016-11-10 MED ORDER — SUFENTANIL CITRATE 50 MCG/ML IV SOLN
INTRAVENOUS | Status: DC | PRN
  Administered 2016-11-10: 10 ug via INTRAVENOUS
  Administered 2016-11-10: 30 ug via INTRAVENOUS
  Administered 2016-11-10: 10 ug via INTRAVENOUS

## 2016-11-10 MED ORDER — PROPOFOL 10 MG/ML IV BOLUS
INTRAVENOUS | Status: AC
  Filled 2016-11-10: qty 20

## 2016-11-10 MED ORDER — PROPOFOL 10 MG/ML IV BOLUS
INTRAVENOUS | Status: DC | PRN
  Administered 2016-11-10: 230 mg via INTRAVENOUS

## 2016-11-10 SURGICAL SUPPLY — 57 items
BAG DECANTER FOR FLEXI CONT (MISCELLANEOUS) IMPLANT
BANDAGE ACE 4X5 VEL STRL LF (GAUZE/BANDAGES/DRESSINGS) ×3 IMPLANT
BANDAGE ACE 6X5 VEL STRL LF (GAUZE/BANDAGES/DRESSINGS) ×1 IMPLANT
BANDAGE ESMARK 6X9 LF (GAUZE/BANDAGES/DRESSINGS) IMPLANT
BNDG CMPR 9X6 STRL LF SNTH (GAUZE/BANDAGES/DRESSINGS) ×1
BNDG COHESIVE 3X5 TAN STRL LF (GAUZE/BANDAGES/DRESSINGS) ×2 IMPLANT
BNDG COHESIVE 4X5 TAN STRL (GAUZE/BANDAGES/DRESSINGS) ×1 IMPLANT
BNDG ESMARK 6X9 LF (GAUZE/BANDAGES/DRESSINGS) ×3
BNDG GAUZE ELAST 4 BULKY (GAUZE/BANDAGES/DRESSINGS) ×3 IMPLANT
CUFF TOURNIQUET SINGLE 18IN (TOURNIQUET CUFF) ×3 IMPLANT
CUFF TOURNIQUET SINGLE 24IN (TOURNIQUET CUFF) IMPLANT
CUFF TOURNIQUET SINGLE 34IN LL (TOURNIQUET CUFF) IMPLANT
CUFF TOURNIQUET SINGLE 44IN (TOURNIQUET CUFF) IMPLANT
DRAIN PENROSE 1/2X12 LTX STRL (WOUND CARE) ×2 IMPLANT
DRAPE U-SHAPE 47X51 STRL (DRAPES) ×3 IMPLANT
DRSG ADAPTIC 3X8 NADH LF (GAUZE/BANDAGES/DRESSINGS) ×3 IMPLANT
DRSG EMULSION OIL 3X3 NADH (GAUZE/BANDAGES/DRESSINGS) ×1 IMPLANT
DRSG PAD ABDOMINAL 8X10 ST (GAUZE/BANDAGES/DRESSINGS) ×3 IMPLANT
DURAPREP 26ML APPLICATOR (WOUND CARE) ×3 IMPLANT
ELECT CAUTERY BLADE 6.4 (BLADE) IMPLANT
ELECT REM PT RETURN 9FT ADLT (ELECTROSURGICAL) ×3
ELECTRODE REM PT RTRN 9FT ADLT (ELECTROSURGICAL) IMPLANT
FACESHIELD WRAPAROUND (MASK) ×3 IMPLANT
FACESHIELD WRAPAROUND OR TEAM (MASK) IMPLANT
GAUZE SPONGE 4X4 12PLY STRL (GAUZE/BANDAGES/DRESSINGS) ×3 IMPLANT
GAUZE XEROFORM 1X8 LF (GAUZE/BANDAGES/DRESSINGS) ×1 IMPLANT
GLOVE BIOGEL PI IND STRL 8 (GLOVE) ×2 IMPLANT
GLOVE BIOGEL PI INDICATOR 8 (GLOVE) ×4
GLOVE ECLIPSE 7.5 STRL STRAW (GLOVE) ×6 IMPLANT
GOWN STRL REUS W/ TWL LRG LVL3 (GOWN DISPOSABLE) ×2 IMPLANT
GOWN STRL REUS W/ TWL XL LVL3 (GOWN DISPOSABLE) ×2 IMPLANT
GOWN STRL REUS W/TWL LRG LVL3 (GOWN DISPOSABLE) ×6
GOWN STRL REUS W/TWL XL LVL3 (GOWN DISPOSABLE) ×6
HANDPIECE INTERPULSE COAX TIP (DISPOSABLE) ×3
KIT BASIN OR (CUSTOM PROCEDURE TRAY) ×3 IMPLANT
KIT ROOM TURNOVER OR (KITS) ×3 IMPLANT
MANIFOLD NEPTUNE II (INSTRUMENTS) ×3 IMPLANT
NS IRRIG 1000ML POUR BTL (IV SOLUTION) ×3 IMPLANT
PACK ORTHO EXTREMITY (CUSTOM PROCEDURE TRAY) ×3 IMPLANT
PAD ARMBOARD 7.5X6 YLW CONV (MISCELLANEOUS) ×6 IMPLANT
PAD CAST 4YDX4 CTTN HI CHSV (CAST SUPPLIES) ×1 IMPLANT
PADDING CAST COTTON 4X4 STRL (CAST SUPPLIES) ×3
SET HNDPC FAN SPRY TIP SCT (DISPOSABLE) IMPLANT
SLING ARM FOAM STRAP LRG (SOFTGOODS) ×2 IMPLANT
SPONGE LAP 18X18 X RAY DECT (DISPOSABLE) ×1 IMPLANT
STAPLER VISISTAT 35W (STAPLE) ×2 IMPLANT
STOCKINETTE IMPERVIOUS 9X36 MD (GAUZE/BANDAGES/DRESSINGS) ×3 IMPLANT
SUT VIC AB 2-0 CT1 27 (SUTURE) ×3
SUT VIC AB 2-0 CT1 TAPERPNT 27 (SUTURE) IMPLANT
SWAB COLLECTION DEVICE MRSA (MISCELLANEOUS) ×2 IMPLANT
TOWEL OR 17X24 6PK STRL BLUE (TOWEL DISPOSABLE) ×3 IMPLANT
TOWEL OR 17X26 10 PK STRL BLUE (TOWEL DISPOSABLE) ×3 IMPLANT
TUBE ANAEROBIC SPECIMEN COL (MISCELLANEOUS) ×2 IMPLANT
TUBE CONNECTING 12'X1/4 (SUCTIONS) ×1
TUBE CONNECTING 12X1/4 (SUCTIONS) ×2 IMPLANT
UNDERPAD 30X30 (UNDERPADS AND DIAPERS) ×3 IMPLANT
YANKAUER SUCT BULB TIP NO VENT (SUCTIONS) ×3 IMPLANT
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
## 2016-11-10 NOTE — Progress Notes (Signed)
Pt brought to room by wheelchair from PACU, able to ambulate with steady gate to bed. Bedside report received from Chip. Pt A&Ox4. LUE in sling and resting on pillows. Pt has no needs at this time, resting in bed comfortably with call light in reach. SO at bedside. Will continue to monitor.

## 2016-11-10 NOTE — Anesthesia Preprocedure Evaluation (Addendum)
Anesthesia Evaluation  Patient identified by MRN, date of birth, ID band Patient awake    Reviewed: Allergy & Precautions, NPO status , Patient's Chart, lab work & pertinent test results  History of Anesthesia Complications Negative for: history of anesthetic complications  Airway Mallampati: II  TM Distance: >3 FB Neck ROM: Full    Dental  (+) Dental Advisory Given   Pulmonary Current Smoker,    breath sounds clear to auscultation       Cardiovascular negative cardio ROS   Rhythm:Regular Rate:Normal     Neuro/Psych negative neurological ROS     GI/Hepatic negative GI ROS, (+)     substance abuse  IV drug use, Hepatitis -, C  Endo/Other  negative endocrine ROS  Renal/GU negative Renal ROS     Musculoskeletal   Abdominal   Peds  Hematology   Anesthesia Other Findings   Reproductive/Obstetrics                            Anesthesia Physical Anesthesia Plan  ASA: II and emergent  Anesthesia Plan: General   Post-op Pain Management:    Induction: Intravenous and Rapid sequence  Airway Management Planned: Oral ETT  Additional Equipment:   Intra-op Plan:   Post-operative Plan: Extubation in OR  Informed Consent: I have reviewed the patients History and Physical, chart, labs and discussed the procedure including the risks, benefits and alternatives for the proposed anesthesia with the patient or authorized representative who has indicated his/her understanding and acceptance.   Dental advisory given  Plan Discussed with: CRNA  Anesthesia Plan Comments:         Anesthesia Quick Evaluation

## 2016-11-10 NOTE — Anesthesia Postprocedure Evaluation (Signed)
Anesthesia Post Note  Patient: Devoria AlbeBrian K Petite  Procedure(s) Performed: Procedure(s) (LRB): IRRIGATION AND DEBRIDEMENT ANTECUBITAL ABSCESS (Left)  Patient location during evaluation: PACU Anesthesia Type: General Level of consciousness: awake and alert Pain management: pain level controlled Vital Signs Assessment: post-procedure vital signs reviewed and stable Respiratory status: spontaneous breathing, nonlabored ventilation, respiratory function stable and patient connected to nasal cannula oxygen Cardiovascular status: blood pressure returned to baseline and stable Postop Assessment: no signs of nausea or vomiting Anesthetic complications: no    Last Vitals:  Vitals:   11/10/16 2215 11/10/16 2230  BP: (!) 154/97   Pulse: 89 81  Resp: 12 12  Temp:  37.1 C    Last Pain:  Vitals:   11/10/16 2200  TempSrc:   PainSc: 5                  Kennieth RadFitzgerald, Rocio Wolak E
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
## 2016-11-10 NOTE — Anesthesia Procedure Notes (Signed)
Procedure Name: MAC Date/Time: 11/10/2016 7:46 PM Performed by: Melina SchoolsBANKS, Aydeen Blume J Pre-anesthesia Checklist: Patient identified, Emergency Drugs available, Suction available, Patient being monitored and Timeout performed Patient Re-evaluated:Patient Re-evaluated prior to inductionOxygen Delivery Method: Circle system utilized Preoxygenation: Pre-oxygenation with 100% oxygen Intubation Type: IV induction, Rapid sequence and Cricoid Pressure applied Laryngoscope Size: Mac and 3 Grade View: Grade I Tube type: Oral Tube size: 7.5 mm Number of attempts: 1 Airway Equipment and Method: Stylet Placement Confirmation: ETT inserted through vocal cords under direct vision,  positive ETCO2 and breath sounds checked- equal and bilateral Secured at: 23 cm Tube secured with: Tape Dental Injury: Teeth and Oropharynx as per pre-operative assessment
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
## 2016-11-10 NOTE — Transfer of Care (Signed)
Immediate Anesthesia Transfer of Care Note  Patient: Jeffery Bennett  Procedure(s) Performed: Procedure(s): IRRIGATION AND DEBRIDEMENT ANTECUBITAL ABSCESS (Left)  Patient Location: PACU  Anesthesia Type:General  Level of Consciousness: awake, alert , oriented and patient cooperative  Airway & Oxygen Therapy: Patient Spontanous Breathing and Patient connected to nasal cannula oxygen  Post-op Assessment: Report given to RN, Post -op Vital signs reviewed and stable and Patient moving all extremities X 4  Post vital signs: Reviewed and stable  Last Vitals:  Vitals:   11/10/16 1417 11/10/16 2030  BP: 113/74   Pulse: 84   Resp: 16   Temp:  36.6 C    Last Pain:  Vitals:   11/10/16 1303  TempSrc:   PainSc: 6          Complications: No apparent anesthesia complications

## 2016-11-10 NOTE — Progress Notes (Signed)
Pharmacy Antibiotic Note  Jeffery Bennett is a 35 y.o. male admitted on 11/10/2016 with L arm abscess. Noted pt with IVDA (heroin). S/p I&D 11/30. Pharmacy has been consulted for Vancomycin and Zosyn dosing.  Vanc 1gm given in ED ~1350. Also received pre-op Ancef 2gm dose ~2000  Plan: Zosyn 3.375gm IV now over 30 min then 3.375gm IV q8h - subsequent doses over 4 hours Vancomycin 1gm IV q8h Will f/u micro data, renal function, and pt's clinical condition Vanc trough prn   Height: 5' 10.8" (179.8 cm) Weight: 164 lb (74.4 kg) IBW/kg (Calculated) : 74.84  Temp (24hrs), Avg:98.4 F (36.9 C), Min:97.9 F (36.6 C), Max:98.8 F (37.1 C)   Recent Labs Lab 11/10/16 1251 11/10/16 1321  WBC 14.4*  --   CREATININE 0.87  --   LATICACIDVEN  --  0.73    Estimated Creatinine Clearance: 125.9 mL/min (by C-G formula based on SCr of 0.87 mg/dL).    No Known Allergies  Antimicrobials this admission: 11/30 Ancef x 1 11/30 Vanc >>  11/30 Zosyn >>   Dose adjustments this admission: n/a  Microbiology results: 11/30 BCx x2:  11/30 L arm abscess: abundant GPC in pairs  Thank you for allowing pharmacy to be a part of this patient's care.  Christoper Fabianaron Jakyrie Totherow, PharmD, BCPS Clinical pharmacist, pager [phone] 11/10/2016 11:22 PM

## 2016-11-10 NOTE — Brief Op Note (Signed)
11/10/2016  8:19 PM  PATIENT:  Jeffery Bennett  35 y.o. male  PRE-OPERATIVE DIAGNOSIS:  abscess, left arm  POST-OPERATIVE DIAGNOSIS:  abscess, left arm  PROCEDURE:  Procedure(s): IRRIGATION AND DEBRIDEMENT ANTECUBITAL ABSCESS (Left)  SURGEON:  Surgeon(s) and Role:    * Jodi GeraldsJohn Nickalus Thornsberry, MD - Primary  PHYSICIAN ASSISTANT:   ASSISTANTS: jim bethune PAC   ANESTHESIA:   general  EBL:  No intake/output data recorded.  BLOOD ADMINISTERED:none  DRAINS: Penrose drain in the l forearm   LOCAL MEDICATIONS USED:  NONE  SPECIMEN:  No Specimen  DISPOSITION OF SPECIMEN:  N/A  COUNTS:  YES  TOURNIQUET:  * Missing tourniquet times found for documented tourniquets in log:  [id number] *  DICTATION: .Other Dictation: Dictation Number [id number]  PLAN OF CARE: Admit to inpatient   PATIENT DISPOSITION:  PACU - hemodynamically stable.   Delay start of Pharmacological VTE agent (>24hrs) due to surgical blood loss or risk of bleeding: no

## 2016-11-10 NOTE — ED Triage Notes (Signed)
Pt from home with c/o left arm redness, swelling, and tenderness starting this past Fri upon waking. Denies injury.  Pt in NAD, A&O.

## 2016-11-11 ENCOUNTER — Encounter (HOSPITAL_COMMUNITY): Payer: Self-pay | Admitting: Orthopedic Surgery

## 2016-11-11 ENCOUNTER — Inpatient Hospital Stay (HOSPITAL_COMMUNITY): Payer: Self-pay

## 2016-11-11 DIAGNOSIS — R52 Pain, unspecified: Secondary | ICD-10-CM

## 2016-11-11 DIAGNOSIS — L02419 Cutaneous abscess of limb, unspecified: Secondary | ICD-10-CM

## 2016-11-11 DIAGNOSIS — L0291 Cutaneous abscess, unspecified: Secondary | ICD-10-CM

## 2016-11-11 LAB — CBC
HEMATOCRIT: 37.8 % — AB (ref 39.0–52.0)
Hemoglobin: 12.4 g/dL — ABNORMAL LOW (ref 13.0–17.0)
MCH: 27.1 pg (ref 26.0–34.0)
MCHC: 32.8 g/dL (ref 30.0–36.0)
MCV: 82.5 fL (ref 78.0–100.0)
Platelets: 342 10*3/uL (ref 150–400)
RBC: 4.58 MIL/uL (ref 4.22–5.81)
RDW: 14.1 % (ref 11.5–15.5)
WBC: 15.2 10*3/uL — AB (ref 4.0–10.5)

## 2016-11-11 LAB — COMPREHENSIVE METABOLIC PANEL
ALBUMIN: 2.8 g/dL — AB (ref 3.5–5.0)
ALT: 17 U/L (ref 17–63)
AST: 21 U/L (ref 15–41)
Alkaline Phosphatase: 70 U/L (ref 38–126)
Anion gap: 11 (ref 5–15)
BUN: 8 mg/dL (ref 6–20)
CHLORIDE: 100 mmol/L — AB (ref 101–111)
CO2: 27 mmol/L (ref 22–32)
Calcium: 9.1 mg/dL (ref 8.9–10.3)
Creatinine, Ser: 0.79 mg/dL (ref 0.61–1.24)
GFR calc Af Amer: >60 mL/min (ref >60)
GLUCOSE: 156 mg/dL — AB (ref 65–99)
POTASSIUM: 4.2 mmol/L (ref 3.5–5.1)
SODIUM: 138 mmol/L (ref 135–145)
Total Bilirubin: 0.2 mg/dL — ABNORMAL LOW (ref 0.3–1.2)
Total Protein: 6.5 g/dL (ref 6.5–8.1)

## 2016-11-11 LAB — RAPID URINE DRUG SCREEN, HOSP PERFORMED
Amphetamines: NOT DETECTED
BARBITURATES: NOT DETECTED
BENZODIAZEPINES: POSITIVE — AB
Cocaine: POSITIVE — AB
Opiates: POSITIVE — AB
Tetrahydrocannabinol: POSITIVE — AB

## 2016-11-11 LAB — ECHOCARDIOGRAM COMPLETE
Height: 70.8 in
WEIGHTICAEL: 2624 [oz_av]

## 2016-11-11 LAB — HIV ANTIBODY (ROUTINE TESTING W REFLEX): HIV SCREEN 4TH GENERATION: NONREACTIVE

## 2016-11-11 MED ORDER — NICOTINE 21 MG/24HR TD PT24
21.0000 mg | MEDICATED_PATCH | Freq: Every day | TRANSDERMAL | Status: DC
  Administered 2016-11-11 – 2016-11-13 (×3): 21 mg via TRANSDERMAL
  Filled 2016-11-11 (×4): qty 1

## 2016-11-11 MED ORDER — OXYCODONE HCL 5 MG PO TABS
5.0000 mg | ORAL_TABLET | Freq: Four times a day (QID) | ORAL | Status: DC
  Administered 2016-11-11 – 2016-11-13 (×10): 5 mg via ORAL
  Filled 2016-11-11 (×10): qty 1

## 2016-11-11 MED ORDER — OXYCODONE HCL 5 MG PO TABS
5.0000 mg | ORAL_TABLET | ORAL | Status: DC | PRN
  Administered 2016-11-11 – 2016-11-13 (×5): 5 mg via ORAL
  Filled 2016-11-11 (×5): qty 1

## 2016-11-11 NOTE — Progress Notes (Signed)
Subjective: 1 Day Post-Op Procedure(s) (LRB): IRRIGATION AND DEBRIDEMENT ANTECUBITAL ABSCESS (Left) Patient reports pain as mild.  "My arm feels much better "  Objective: Vital signs in last 24 hours: Temp:  [97.9 F (36.6 C)-98.8 F (37.1 C)] 97.9 F (36.6 C) (12/01 0600) Pulse Rate:  [65-112] 65 (12/01 0600) Resp:  [10-20] 16 (12/01 0600) BP: (102-166)/(53-118) 123/71 (12/01 0600) SpO2:  [95 %-100 %] 100 % (12/01 0600) Weight:  [74.4 kg (164 lb)-75.8 kg (167 lb)] 74.4 kg (164 lb) (11/30 2250)  Intake/Output from previous day: 11/30 0701 - 12/01 0700 In: 1890 [P.O.:240; I.V.:1450; IV Piggyback:200] Out: 1420 [Urine:1400; Blood:20] Intake/Output this shift: Total I/O In: 300 [P.O.:300] Out: -    Recent Labs  11/10/16 1251 11/11/16 0406  HGB 12.9* 12.4*    Recent Labs  11/10/16 1251 11/11/16 0406  WBC 14.4* 15.2*  RBC 4.70 4.58  HCT 38.9* 37.8*  PLT 342 342    Recent Labs  11/10/16 1251 11/11/16 0406  NA 133* 138  K 3.7 4.2  CL 99* 100*  CO2 26 27  BUN 6 8  CREATININE 0.87 0.79  GLUCOSE 110* 156*  CALCIUM 9.1 9.1  Cultures show positive gram-positive cocci in pairs. No results for input(s): LABPT, INR in the last 72 hours. Left upper extremity exam: Long-arm posterior splint is intact. Moves fingers actively. No evidence of drainage of any kind.  Assessment/Plan: 1 Day Post-Op Procedure(s) (LRB): IRRIGATION AND DEBRIDEMENT ANTECUBITAL ABSCESS (Left) Plan: Continue IV antibiotics per medicine service. Await culture results. Patient will be seen by Georjean ModeKayla Mackenzie PA-C this weekend who is on call. She will need to remove the Ace bandage, split the web roll down the antecubital region and pull the Penrose drain which is not sewn in on Sunday. Hopefully will not need re-washout. The wound looks good on Sunday will continue treatment plan without further need for I&D.   Kierre Hintz G 11/11/2016, 10:47 AM

## 2016-11-11 NOTE — Op Note (Deleted)
  The note originally documented on this encounter has been moved the the encounter in which it belongs.  

## 2016-11-11 NOTE — Op Note (Signed)
NAMReinaldo Bennett:  Joynt, Jaeveon             ACCOUNT NO.:  [id number]  MEDICAL RECORD NO.:  [id number]  LOCATION:                               FACILITY:  [facility]  PHYSICIAN:  Harvie JuniorJohn L. Tyjai Charbonnet, M.D.   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  PREOPERATIVE DIAGNOSIS:  Abscess, left forearm.  POSTOPERATIVE DIAGNOSIS:  Abscess, left forearm.  PROCEDURE:  Excisional debridement of skin, subcutaneous tissue, fascia, and muscle at the site of an abscess.  ASSISTANT:  Marshia LyJames Bethune, P.A.  ANESTHESIA:  General.  BRIEF HISTORY:  [name] is a 35 year old otherwise healthy male, who began having symptoms of pain in his left forearm about 5 days ago. He initially got better and then ultimately got worse.  He presented to the emergency room with red swollen forearm, with an elevated white count, and no increased temperature elevation.  They did a CT of him, which showed that he had an abscess in his forearm and we were consulted for evaluation.  At that point, we felt that he needed irrigation and debridement.  He was brought to the operating room for this procedure.  DESCRIPTION OF PROCEDURE:  The patient was brought to the operating room and after adequate anesthesia was obtained with general anesthetic, the patient was placed supine on the operating table.  Left arm was prepped and draped in usual sterile fashion.  Following this, an incision was made over the distal arm with a curve at the elbow crease, subcutaneous tissue down to the level of the fascia.  The fascia opened and a gloved finger was placed deep into the wound.  We debrided some skin, subcutaneous tissue, and fascia, which appeared to be infected and a large abscess was identified in the forearm.  We went tracked this abscess all the way down to the  insertion of the biceps tendon.  The biceps tendon insertion was normal.  We then tracked it proximally up to about the mid arm.  We then got a pulse lavage irrigator and irrigated this out thoroughly.  Once this was done, we debrided some fascia and muscle at the site of this infection.  Once this was done, a half-inch Penrose drain was placed in the wound and we closed the skin loosely and then over this Penrose drain, a sterile compressive dressing was applied as well as a posterior splint.  The patient was taken to the recovery room where he was noted to be in satisfactory condition. Estimated blood loss for the procedure was minimum.  Prior to starting of the case, the arm was elevated for 2 minutes and a blood pressure tourniquet inflated to 250 mmHg.  The tourniquet was let down once the dressing was put on and a total tourniquet time can be adjusted from the medical record.     Harvie JuniorJohn L. Xela Oregel, M.D.     Ranae PlumberJLG/MEDQ  D:  11/10/2016  T:  11/11/2016  Job:  [id number]
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
## 2016-11-11 NOTE — Progress Notes (Signed)
*  PRELIMINARY RESULTS* Echocardiogram 2D Echocardiogram has been performed.  Jeryl Columbialliott, Terricka Onofrio 11/11/2016, 10:23 AM

## 2016-11-12 DIAGNOSIS — B1920 Unspecified viral hepatitis C without hepatic coma: Secondary | ICD-10-CM

## 2016-11-12 LAB — BASIC METABOLIC PANEL
ANION GAP: 9 (ref 5–15)
BUN: 9 mg/dL (ref 6–20)
CHLORIDE: 102 mmol/L (ref 101–111)
CO2: 26 mmol/L (ref 22–32)
Calcium: 8.9 mg/dL (ref 8.9–10.3)
Creatinine, Ser: 0.98 mg/dL (ref 0.61–1.24)
GFR calc Af Amer: >60 mL/min (ref >60)
GLUCOSE: 124 mg/dL — AB (ref 65–99)
POTASSIUM: 3.3 mmol/L — AB (ref 3.5–5.1)
Sodium: 137 mmol/L (ref 135–145)

## 2016-11-12 LAB — CBC
HCT: 35.2 % — ABNORMAL LOW (ref 39.0–52.0)
HEMOGLOBIN: 11.5 g/dL — AB (ref 13.0–17.0)
MCH: 27.2 pg (ref 26.0–34.0)
MCHC: 32.7 g/dL (ref 30.0–36.0)
MCV: 83.2 fL (ref 78.0–100.0)
PLATELETS: 322 10*3/uL (ref 150–400)
RBC: 4.23 MIL/uL (ref 4.22–5.81)
RDW: 14.2 % (ref 11.5–15.5)
WBC: 13.4 10*3/uL — AB (ref 4.0–10.5)

## 2016-11-12 LAB — HEPATITIS PANEL, ACUTE
HEP B S AG: NEGATIVE
Hep A IgM: NEGATIVE
Hep B C IgM: NEGATIVE

## 2016-11-12 MED ORDER — SULFAMETHOXAZOLE-TRIMETHOPRIM 800-160 MG PO TABS
1.0000 | ORAL_TABLET | Freq: Once | ORAL | Status: AC
  Administered 2016-11-13: 1 via ORAL
  Filled 2016-11-12: qty 1

## 2016-11-12 MED ORDER — POTASSIUM CHLORIDE CRYS ER 20 MEQ PO TBCR
40.0000 meq | EXTENDED_RELEASE_TABLET | Freq: Once | ORAL | Status: AC
  Administered 2016-11-12: 40 meq via ORAL
  Filled 2016-11-12: qty 2

## 2016-11-12 MED ORDER — PHENOL 1.4 % MT LIQD
1.0000 | OROMUCOSAL | Status: DC | PRN
  Filled 2016-11-12: qty 177

## 2016-11-12 MED ORDER — CYCLOBENZAPRINE HCL 5 MG PO TABS
5.0000 mg | ORAL_TABLET | Freq: Two times a day (BID) | ORAL | Status: DC
Start: 2016-11-12 — End: 2016-11-13
  Administered 2016-11-12 – 2016-11-13 (×3): 5 mg via ORAL
  Filled 2016-11-12 (×3): qty 1

## 2016-11-12 NOTE — Progress Notes (Signed)
Went to discuss antibiotics with the patient. Continues to refuse all IV antibiotics, stating "I am through with the tubes."   Discussed consequences of discontinuing antibiotics, including need for further surgery and prolongation of hospital stay. Reports he does not care about the consequences and knows that nothing is going to happen if he doesn't take antibiotics. Reports he is thinking about just being done with being in the hospital and leaving.   Note Penrose Drain is still in place and will need to be removed prior to discharge.   Discussed with Pharmacy. There are limited options and none ideal since cultures sensitivities are not back yet. May use Bactrim once for tonight or contact ID to initiate Iinezolid. Will give one time dose of Bactrim tonight and re-evaluate in the morning. Orthopedics to see 12/3 and determine if further surgery is needed.  Dr. Caroleen Hammanumley 11/12/16, 11:18 PM.

## 2016-11-12 NOTE — Progress Notes (Signed)
Subjective: 2 Day Post-Op Procedure(s) (LRB): IRRIGATION AND DEBRIDEMENT ANTECUBITAL ABSCESS (Left) Patient reports pain as mild. Doing well   Objective: BP 125/76 (BP Location: Right Arm)   Pulse 65   Temp 98.1 F (36.7 C) (Oral)   Resp 17   Ht 5\' 9"  (1.753 m)   Wt 75.5 kg (166 lb 8 oz)   SpO2 99%   BMI 24.59 kg/m     Intake/Output Summary (Last 24 hours) at 11/12/16 1314 Last data filed at 11/12/16 0900  Gross per 24 hour  Intake              790 ml  Output              975 ml  Net             -185 ml   CBC Latest Ref Rng & Units 11/12/2016 11/11/2016 11/10/2016  WBC 4.0 - 10.5 K/uL 13.4(H) 15.2(H) 14.4(H)  Hemoglobin 13.0 - 17.0 g/dL 11.5(L) 12.4(L) 12.9(L)  Hematocrit 39.0 - 52.0 % 35.2(L) 37.8(L) 38.9(L)  Platelets 150 - 400 K/uL 322 342 342   CMP Latest Ref Rng & Units 11/12/2016 11/11/2016 11/10/2016  Glucose 65 - 99 mg/dL 161(W124(H) 960(A156(H) 540(J110(H)  BUN 6 - 20 mg/dL 9 8 6   Creatinine 0.61 - 1.24 mg/dL 8.110.98 9.140.79 7.820.87  Sodium 135 - 145 mmol/L 137 138 133(L)  Potassium 3.5 - 5.1 mmol/L 3.3(L) 4.2 3.7  Chloride 101 - 111 mmol/L 102 100(L) 99(L)  CO2 22 - 32 mmol/L 26 27 26   Calcium 8.9 - 10.3 mg/dL 8.9 9.1 9.1  Total Protein 6.5 - 8.1 g/dL - 6.5 7.3  Total Bilirubin 0.3 - 1.2 mg/dL - 9.5(A0.2(L) 0.3  Alkaline Phos 38 - 126 U/L - 70 76  AST 15 - 41 U/L - 21 20  ALT 17 - 63 U/L - 17 18    Left upper extremity exam: Long-arm posterior splint is intact. Moves fingers actively. No evidence of drainage of any kind.  <2sec cap refill No Ecchy distal  Assessment/Plan: 2 Day Post-Op Procedure(s) (LRB): IRRIGATION AND DEBRIDEMENT ANTECUBITAL ABSCESS (Left) Plan: Continue IV antibiotics per medicine service. Await culture results. Will remove the Ace bandage, split the web roll down the antecubital region and pull the Penrose drain which is not sewn in on Sunday. Hopefully will not need re-washout. The wound looks good on Sunday will continue treatment plan without further need  for I&D.

## 2016-11-12 NOTE — Progress Notes (Signed)
Pt refused his IV antibiotics. He says he prefers to have them by mouth. MD notified. Md says she'd talk to the pt. Will continue to monitor.
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
## 2016-11-12 NOTE — Progress Notes (Signed)
Attempted to administer IV antibiotics. Pt refusing antibiotics at this time. Pt stated in a irritable voice, "I do not want ya'll to bother me anymore until after 12:00. I need sleep and I cant do that with ya'll bugging me very 5 minutes". Educated pt on importance of the antibiotics. Pt replied, "I can't sleep with the IV hooked up to me". Pharmacy made aware and are adjusting times appropriately. Will administer around 1200.

## 2016-11-13 LAB — BASIC METABOLIC PANEL
ANION GAP: 9 (ref 5–15)
BUN: 11 mg/dL (ref 6–20)
CHLORIDE: 105 mmol/L (ref 101–111)
CO2: 27 mmol/L (ref 22–32)
Calcium: 8.8 mg/dL — ABNORMAL LOW (ref 8.9–10.3)
Creatinine, Ser: 1.09 mg/dL (ref 0.61–1.24)
GFR calc non Af Amer: >60 mL/min (ref >60)
Glucose, Bld: 92 mg/dL (ref 65–99)
Potassium: 3.8 mmol/L (ref 3.5–5.1)
Sodium: 141 mmol/L (ref 135–145)

## 2016-11-13 LAB — CBC
HEMATOCRIT: 39.2 % (ref 39.0–52.0)
HEMOGLOBIN: 12.8 g/dL — AB (ref 13.0–17.0)
MCH: 27.3 pg (ref 26.0–34.0)
MCHC: 32.7 g/dL (ref 30.0–36.0)
MCV: 83.6 fL (ref 78.0–100.0)
Platelets: 381 10*3/uL (ref 150–400)
RBC: 4.69 MIL/uL (ref 4.22–5.81)
RDW: 14.2 % (ref 11.5–15.5)
WBC: 9 10*3/uL (ref 4.0–10.5)

## 2016-11-13 MED ORDER — DOXYCYCLINE HYCLATE 100 MG PO TABS
100.0000 mg | ORAL_TABLET | Freq: Two times a day (BID) | ORAL | Status: DC
  Administered 2016-11-13: 100 mg via ORAL
  Filled 2016-11-13: qty 1

## 2016-11-13 MED ORDER — PIPERACILLIN-TAZOBACTAM 3.375 G IVPB
3.3750 g | Freq: Three times a day (TID) | INTRAVENOUS | Status: DC
  Filled 2016-11-13 (×2): qty 50
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
## 2016-11-13 NOTE — Progress Notes (Signed)
Patient came to desk stating he was leaving. We paged the attending, [name] very anxious and impatient and stated again he was leaving and directed us to take out his IV. IV removed AMA paper signed.

## 2016-11-13 NOTE — Progress Notes (Signed)
FPTS Interim Progress Note  Paged by RN regarding patient stating that he wants to leave.  Called back to let team know I will come up to floor to speak with patient, but he had already left.  Patient left AMA.  Blood cultures still pending and patient left without any antibiotics and without follow up.   Renne Muscaaniel L Warden, MD 11/13/2016, 3:09 PM PGY-1, [facility] pager [phone]

## 2016-11-13 NOTE — Progress Notes (Signed)
FPTS Interim Progress Note  Paged by RN who noted patient continuing to refuse IV abx.  Wound cultures are sensitive to tetracycline and we will start doxycycline 100mg  BID.  Also discussed change with pharmacy.   Vancomycin DC'd.  Renne Muscaaniel L Briarrose Shor, MD 11/13/2016, 1:15 PM PGY-1, [facility] pager [phone]

## 2016-11-13 NOTE — Progress Notes (Signed)
Pharmacy Antibiotic Note  Jeffery Bennett is a 35 y.o. male admitted on 11/10/2016 with L arm abscess. Noted pt with IVDA (heroin). S/p I&D 11/30. He has a major issue with IVDA and refused IV abx last PM. It has been restarted. Due to his IVDA, outpt PICC would not be feasible. Sens of MRSA are back. Zyvox may be an option here. It'll be expensive if pt has no insurance.   Plan:  Vancomycin 1gm IV q8h Hold off trough for now   Height: 5\' 9"  (175.3 cm) Weight: 166 lb 8 oz (75.5 kg) IBW/kg (Calculated) : 70.7  Temp (24hrs), Avg:98.4 F (36.9 C), Min:98.2 F (36.8 C), Max:98.6 F (37 C)   Recent Labs Lab 11/10/16 1251 11/10/16 1321 11/11/16 0406 11/12/16 0446 11/13/16 0441  WBC 14.4*  --  15.2* 13.4* 9.0  CREATININE 0.87  --  0.79 0.98 1.09  LATICACIDVEN  --  0.73  --   --   --     Estimated Creatinine Clearance: 95.5 mL/min (by C-G formula based on SCr of 1.09 mg/dL).    No Known Allergies  Antimicrobials this admission: 11/30 Ancef x 1 11/30 Vanc >>  11/30 Zosyn >> 12/2  Dose adjustments this admission: n/a  Microbiology results: 11/30 BCx x2: neg 11/30 L arm abscess: MRSA (S - linezolid, vanc, tetracycline. R - septra. I - Jeffery Bennett)   Jeffery Bennett, PharmD, BCPS, AAHIVP, CPP Infectious Disease Pharmacist Pager: [phone] 11/13/2016 11:33 AM

## 2016-11-13 NOTE — Progress Notes (Signed)
Orthopedic Tech Progress Note Patient Details:  Jeffery AlbeBrian K Samarin January 30, 1981 [id number]  Ortho Devices Type of Ortho Device: Long arm splint Ortho Device/Splint Location: Applied ulna/posterior splint to Left Arm.  Dressed wound with Gauze and webril as ordered. Pt was helpful and arm sling was already in room. Pt was placed in Arm Sling for Support. Ortho Device/Splint Interventions: Ordered, Application   Alvina ChouWilliams, Kaydan Wilhoite C 11/13/2016, 12:44 PM

## 2016-11-13 NOTE — Progress Notes (Signed)
Patient refusing IV vanco at this time. Provider paged. Will continue to monitor. Jeffery Bennett  Jeffery Bennett

## 2016-11-13 NOTE — Progress Notes (Signed)
    Subjective: 3 Day Post-OpProcedure(s) (LRB): IRRIGATION AND DEBRIDEMENT ANTECUBITAL ABSCESS (Left) Patient reports pain as mild. Doing well, he is refusing IV ABX, states he wants to go home  Objective: BP 108/71 (BP Location: Right Arm)   Pulse 94   Temp 98.2 F (36.8 C) (Oral)   Resp 17   Ht 5\' 9"  (1.753 m)   Wt 75.5 kg (166 lb 8 oz)   SpO2 98%   BMI 24.59 kg/m   No intake or output data in the 24 hours ending 11/13/16 1019  CBC    Component Value Date/Time   WBC 9.0 11/13/2016 0441   RBC 4.69 11/13/2016 0441   HGB 12.8 (L) 11/13/2016 0441   HCT 39.2 11/13/2016 0441   PLT 381 11/13/2016 0441   MCV 83.6 11/13/2016 0441   MCH 27.3 11/13/2016 0441   MCHC 32.7 11/13/2016 0441   RDW 14.2 11/13/2016 0441   LYMPHSABS 1.3 11/10/2016 1251   MONOABS 1.2 (H) 11/10/2016 1251   EOSABS 0.1 11/10/2016 1251   BASOSABS 0.0 11/10/2016 1251   BMP Latest Ref Rng & Units 11/13/2016 11/12/2016 11/11/2016  Glucose 65 - 99 mg/dL 92 161(W124(H) 960(A156(H)  BUN 6 - 20 mg/dL 11 9 8   Creatinine 0.61 - 1.24 mg/dL 5.401.09 9.810.98 1.910.79  Sodium 135 - 145 mmol/L 141 137 138  Potassium 3.5 - 5.1 mmol/L 3.8 3.3(L) 4.2  Chloride 101 - 111 mmol/L 105 102 100(L)  CO2 22 - 32 mmol/L 27 26 27   Calcium 8.9 - 10.3 mg/dL 4.7(W8.8(L) 8.9 9.1    Left upper extremity exam: Long-arm posterior splint is intact. Moves fingers actively. No evidence of drainage of any kind.  <2sec cap refill No Ecchy distal  Splint was removed and webril taken down, incision was exposed and drain remains in place. Drain was removed without difficulty. No pustulent drainage, mild blood only, upon pressure and manipulation of wound no pus could be extracted. No erythema  Assessment/Plan: 3 Day Post-OpProcedure(s) (LRB): IRRIGATION AND DEBRIDEMENT ANTECUBITAL ABSCESS (Left) Plan: Continue IV antibiotics per medicine service.  -Discussed with pt importance of continuing ABX   Doe not appear to need re-washout. The wound looks good on  Sunday will continue treatment plan without further need for I&D  Re-apply splint, see order
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
## 2016-11-15 LAB — CULTURE, BLOOD (ROUTINE X 2)
CULTURE: NO GROWTH
Culture: NO GROWTH

## 2016-11-15 LAB — AEROBIC/ANAEROBIC CULTURE (SURGICAL/DEEP WOUND)

## 2016-11-15 LAB — AEROBIC/ANAEROBIC CULTURE W GRAM STAIN (SURGICAL/DEEP WOUND)

## 2016-11-22 LAB — HEPATITIS C VRS RNA DETECT BY PCR-QUAL
ALT(SGPT): 17 U/L
HEPATITIS C VRS RNA BY PCR-QUAL: POSITIVE — AB

## 2018-04-29 ENCOUNTER — Emergency Department (HOSPITAL_COMMUNITY): Payer: Self-pay

## 2018-04-29 ENCOUNTER — Encounter (HOSPITAL_COMMUNITY): Payer: Self-pay | Admitting: Emergency Medicine

## 2018-04-29 ENCOUNTER — Inpatient Hospital Stay (HOSPITAL_COMMUNITY)
Admission: EM | Admit: 2018-04-29 | Discharge: 2018-05-28 | DRG: 219 | Discharge status: Against Medical Advice | Payer: Self-pay | Attending: Surgery | Admitting: Surgery

## 2018-04-29 DIAGNOSIS — F1120 Opioid dependence, uncomplicated: Secondary | ICD-10-CM | POA: Diagnosis present

## 2018-04-29 DIAGNOSIS — K036 Deposits [accretions] on teeth: Secondary | ICD-10-CM | POA: Diagnosis present

## 2018-04-29 DIAGNOSIS — R7881 Bacteremia: Secondary | ICD-10-CM

## 2018-04-29 DIAGNOSIS — F17210 Nicotine dependence, cigarettes, uncomplicated: Secondary | ICD-10-CM | POA: Diagnosis present

## 2018-04-29 DIAGNOSIS — K083 Retained dental root: Secondary | ICD-10-CM | POA: Diagnosis present

## 2018-04-29 DIAGNOSIS — K047 Periapical abscess without sinus: Secondary | ICD-10-CM | POA: Diagnosis present

## 2018-04-29 DIAGNOSIS — Z7151 Drug abuse counseling and surveillance of drug abuser: Secondary | ICD-10-CM

## 2018-04-29 DIAGNOSIS — Z01818 Encounter for other preprocedural examination: Secondary | ICD-10-CM

## 2018-04-29 DIAGNOSIS — Z1611 Resistance to penicillins: Secondary | ICD-10-CM | POA: Diagnosis present

## 2018-04-29 DIAGNOSIS — M25512 Pain in left shoulder: Secondary | ICD-10-CM | POA: Diagnosis present

## 2018-04-29 DIAGNOSIS — F1910 Other psychoactive substance abuse, uncomplicated: Secondary | ICD-10-CM

## 2018-04-29 DIAGNOSIS — J189 Pneumonia, unspecified organism: Secondary | ICD-10-CM | POA: Diagnosis present

## 2018-04-29 DIAGNOSIS — B957 Other staphylococcus as the cause of diseases classified elsewhere: Secondary | ICD-10-CM | POA: Diagnosis present

## 2018-04-29 DIAGNOSIS — Z599 Problem related to housing and economic circumstances, unspecified: Secondary | ICD-10-CM

## 2018-04-29 DIAGNOSIS — B182 Chronic viral hepatitis C: Secondary | ICD-10-CM | POA: Diagnosis present

## 2018-04-29 DIAGNOSIS — I2690 Septic pulmonary embolism without acute cor pulmonale: Secondary | ICD-10-CM | POA: Diagnosis present

## 2018-04-29 DIAGNOSIS — K029 Dental caries, unspecified: Secondary | ICD-10-CM | POA: Diagnosis present

## 2018-04-29 DIAGNOSIS — K5900 Constipation, unspecified: Secondary | ICD-10-CM | POA: Diagnosis present

## 2018-04-29 DIAGNOSIS — I351 Nonrheumatic aortic (valve) insufficiency: Secondary | ICD-10-CM

## 2018-04-29 DIAGNOSIS — B1920 Unspecified viral hepatitis C without hepatic coma: Secondary | ICD-10-CM

## 2018-04-29 DIAGNOSIS — I330 Acute and subacute infective endocarditis: Principal | ICD-10-CM

## 2018-04-29 DIAGNOSIS — Z8614 Personal history of Methicillin resistant Staphylococcus aureus infection: Secondary | ICD-10-CM

## 2018-04-29 DIAGNOSIS — D638 Anemia in other chronic diseases classified elsewhere: Secondary | ICD-10-CM | POA: Diagnosis present

## 2018-04-29 DIAGNOSIS — I471 Supraventricular tachycardia: Secondary | ICD-10-CM | POA: Diagnosis not present

## 2018-04-29 DIAGNOSIS — K0530 Chronic periodontitis, unspecified: Secondary | ICD-10-CM | POA: Diagnosis present

## 2018-04-29 DIAGNOSIS — R079 Chest pain, unspecified: Secondary | ICD-10-CM

## 2018-04-29 DIAGNOSIS — B952 Enterococcus as the cause of diseases classified elsewhere: Secondary | ICD-10-CM | POA: Diagnosis present

## 2018-04-29 DIAGNOSIS — Z952 Presence of prosthetic heart valve: Secondary | ICD-10-CM

## 2018-04-29 DIAGNOSIS — D649 Anemia, unspecified: Secondary | ICD-10-CM

## 2018-04-29 DIAGNOSIS — K0401 Reversible pulpitis: Secondary | ICD-10-CM | POA: Diagnosis present

## 2018-04-29 DIAGNOSIS — F1590 Other stimulant use, unspecified, uncomplicated: Secondary | ICD-10-CM | POA: Diagnosis present

## 2018-04-29 DIAGNOSIS — Z23 Encounter for immunization: Secondary | ICD-10-CM

## 2018-04-29 DIAGNOSIS — R0602 Shortness of breath: Secondary | ICD-10-CM

## 2018-04-29 LAB — CBC WITH DIFFERENTIAL/PLATELET
ABS IMMATURE GRANULOCYTES: 0.1 10*3/uL (ref 0.0–0.1)
BASOS ABS: 0.1 10*3/uL (ref 0.0–0.1)
BASOS PCT: 0 %
EOS ABS: 0.3 10*3/uL (ref 0.0–0.7)
EOS PCT: 2 %
HEMATOCRIT: 35.1 % — AB (ref 39.0–52.0)
HEMOGLOBIN: 11.1 g/dL — AB (ref 13.0–17.0)
Immature Granulocytes: 1 %
Lymphocytes Relative: 16 %
Lymphs Abs: 1.8 10*3/uL (ref 0.7–4.0)
MCH: 26 pg (ref 26.0–34.0)
MCHC: 31.6 g/dL (ref 30.0–36.0)
MCV: 82.2 fL (ref 78.0–100.0)
Monocytes Absolute: 0.9 10*3/uL (ref 0.1–1.0)
Monocytes Relative: 8 %
Neutro Abs: 8.2 10*3/uL — ABNORMAL HIGH (ref 1.7–7.7)
Neutrophils Relative %: 73 %
Platelets: 321 10*3/uL (ref 150–400)
RBC: 4.27 MIL/uL (ref 4.22–5.81)
RDW: 14 % (ref 11.5–15.5)
WBC: 11.3 10*3/uL — AB (ref 4.0–10.5)

## 2018-04-29 LAB — COMPREHENSIVE METABOLIC PANEL
ALBUMIN: 2.9 g/dL — AB (ref 3.5–5.0)
ALT: 12 U/L — AB (ref 17–63)
ANION GAP: 8 (ref 5–15)
AST: 13 U/L — ABNORMAL LOW (ref 15–41)
Alkaline Phosphatase: 73 U/L (ref 38–126)
BILIRUBIN TOTAL: 0.4 mg/dL (ref 0.3–1.2)
BUN: 9 mg/dL (ref 6–20)
CO2: 28 mmol/L (ref 22–32)
CREATININE: 0.95 mg/dL (ref 0.61–1.24)
Calcium: 9 mg/dL (ref 8.9–10.3)
Chloride: 100 mmol/L — ABNORMAL LOW (ref 101–111)
GFR calc Af Amer: >60 mL/min (ref >60)
GFR calc non Af Amer: >60 mL/min (ref >60)
GLUCOSE: 102 mg/dL — AB (ref 65–99)
Potassium: 3.8 mmol/L (ref 3.5–5.1)
Sodium: 136 mmol/L (ref 135–145)
TOTAL PROTEIN: 7.4 g/dL (ref 6.5–8.1)

## 2018-04-29 LAB — LIPASE, BLOOD: Lipase: 24 U/L (ref 11–51)

## 2018-04-29 LAB — I-STAT TROPONIN, ED: Troponin i, poc: 0.02 ng/mL (ref 0.00–0.08)

## 2018-04-29 MED ORDER — IOPAMIDOL (ISOVUE-370) INJECTION 76%
100.0000 mL | Freq: Once | INTRAVENOUS | Status: AC | PRN
  Administered 2018-04-29: 100 mL via INTRAVENOUS

## 2018-04-29 MED ORDER — NICOTINE 21 MG/24HR TD PT24
21.0000 mg | MEDICATED_PATCH | Freq: Once | TRANSDERMAL | Status: AC
  Administered 2018-04-29: 21 mg via TRANSDERMAL
  Filled 2018-04-29: qty 1

## 2018-04-29 MED ORDER — IOPAMIDOL (ISOVUE-370) INJECTION 76%
INTRAVENOUS | Status: AC
Start: 2018-04-29 — End: 2018-04-30
  Filled 2018-04-29: qty 100

## 2018-04-29 MED ORDER — SODIUM CHLORIDE 0.9 % IV BOLUS
500.0000 mL | Freq: Once | INTRAVENOUS | Status: AC
  Administered 2018-04-29: 500 mL via INTRAVENOUS

## 2018-04-29 MED ORDER — SODIUM CHLORIDE 0.9 % IV SOLN
INTRAVENOUS | Status: DC
  Administered 2018-04-29: 23:00:00 via INTRAVENOUS

## 2018-04-29 NOTE — ED Notes (Signed)
Pt. To CT via stretcher. 

## 2018-04-29 NOTE — ED Triage Notes (Signed)
Pt c/o L shoulder pain with movement/breathing onset 2 days ago. Pt denies known injury/strain.  Pt also c/o sore areas to lateral aspect of R foot, pt denies known injury.

## 2018-04-29 NOTE — ED Notes (Signed)
Pt. Requesting to go outside to his car. Pt. States " I can take this sh*t out of my arm". Explained to the pt. That he could not leave the hospital because he was being admitted. Pt. Then said "f*ck off and leave me alone. Im pissed off." This nurse left the room. Security made aware.

## 2018-04-29 NOTE — ED Provider Notes (Addendum)
MSE was initiated and I personally evaluated the patient and placed orders (if any) at  6:11 PM on Apr 29, 2018.  Patient is a 37 year old male with a history of hep C and IVDU, who presents to the emergency department today complaining of left posterior shoulder pain that began about 2 days ago.  He states that his pain is not exacerbated by movement or palpation however it is worse every time he takes a deep breath or coughs.  He reports associated shortness of breath.  He denies any chest pain.  States that he did have some chest pain about a month ago and was told by his counselor at the methadone clinic that he had a heart murmur.  He was told at that time to follow-up with a doctor he did not do so.  Since then his chest pain has resolved and he has had no continued chest pain.  He denies any fevers, chills, abdominal pain, nausea, vomiting, diarrhea, urinary symptoms.  He is complaining of right foot pain that began yesterday.  Reports redness and warmth to the right foot.  Of note he states that he injected meth into his arm 2 days ago.  He denies that he injected any medicine to his foot. States he used a dirty needle.  Physical Exam  Constitutional: He appears well-developed and well-nourished.  HENT:  Head: Normocephalic and atraumatic.  Eyes: Pupils are equal, round, and reactive to light. Conjunctivae and EOM are normal.  Neck: Neck supple.  Cardiovascular: Normal rate and intact distal pulses.  Murmur heard. tachycardic  Pulmonary/Chest: Effort normal and breath sounds normal. No respiratory distress. He has no wheezes.  Abdominal: Soft. Bowel sounds are normal. There is no tenderness.  Musculoskeletal:  No TTP throughout the shoulder. Plantar aspect of the right foot is TTP and there are small erythematous lesions on the feet concerning for osler's nodes  Neurological: He is alert.  Skin: Skin is warm and dry. Capillary refill takes less than 2 seconds.  Psychiatric: He has a normal  mood and affect.   Patient was seen in fast track area.  A medical screening exam was performed and nursing staff was made aware that patient will require a higher level of care.  The patient appears stable so that the remainder of the MSE may be completed by another provider.   Karrie Meres, PA-C 04/29/18 1818    Karrie Meres, PA-C 04/29/18 1828    Vanetta Mulders, MD 04/29/18 2325

## 2018-04-30 ENCOUNTER — Other Ambulatory Visit: Payer: Self-pay

## 2018-04-30 ENCOUNTER — Observation Stay (HOSPITAL_BASED_OUTPATIENT_CLINIC_OR_DEPARTMENT_OTHER): Payer: Self-pay

## 2018-04-30 ENCOUNTER — Encounter (HOSPITAL_COMMUNITY): Payer: Self-pay | Admitting: Internal Medicine

## 2018-04-30 DIAGNOSIS — I743 Embolism and thrombosis of arteries of the lower extremities: Secondary | ICD-10-CM

## 2018-04-30 DIAGNOSIS — Z8614 Personal history of Methicillin resistant Staphylococcus aureus infection: Secondary | ICD-10-CM

## 2018-04-30 DIAGNOSIS — R079 Chest pain, unspecified: Secondary | ICD-10-CM

## 2018-04-30 DIAGNOSIS — M25519 Pain in unspecified shoulder: Secondary | ICD-10-CM

## 2018-04-30 DIAGNOSIS — R6510 Systemic inflammatory response syndrome (SIRS) of non-infectious origin without acute organ dysfunction: Secondary | ICD-10-CM | POA: Insufficient documentation

## 2018-04-30 DIAGNOSIS — D649 Anemia, unspecified: Secondary | ICD-10-CM

## 2018-04-30 DIAGNOSIS — F17210 Nicotine dependence, cigarettes, uncomplicated: Secondary | ICD-10-CM

## 2018-04-30 DIAGNOSIS — R918 Other nonspecific abnormal finding of lung field: Secondary | ICD-10-CM

## 2018-04-30 DIAGNOSIS — M542 Cervicalgia: Secondary | ICD-10-CM

## 2018-04-30 DIAGNOSIS — J189 Pneumonia, unspecified organism: Secondary | ICD-10-CM | POA: Diagnosis present

## 2018-04-30 DIAGNOSIS — I76 Septic arterial embolism: Secondary | ICD-10-CM

## 2018-04-30 LAB — BLOOD CULTURE ID PANEL (REFLEXED)
Acinetobacter baumannii: NOT DETECTED
CANDIDA ALBICANS: NOT DETECTED
CANDIDA KRUSEI: NOT DETECTED
CANDIDA PARAPSILOSIS: NOT DETECTED
CANDIDA TROPICALIS: NOT DETECTED
Candida glabrata: NOT DETECTED
ENTEROBACTER CLOACAE COMPLEX: NOT DETECTED
ENTEROBACTERIACEAE SPECIES: NOT DETECTED
ENTEROCOCCUS SPECIES: DETECTED — AB
Escherichia coli: NOT DETECTED
HAEMOPHILUS INFLUENZAE: NOT DETECTED
Klebsiella oxytoca: NOT DETECTED
Klebsiella pneumoniae: NOT DETECTED
Listeria monocytogenes: NOT DETECTED
NEISSERIA MENINGITIDIS: NOT DETECTED
Proteus species: NOT DETECTED
Pseudomonas aeruginosa: NOT DETECTED
STAPHYLOCOCCUS SPECIES: NOT DETECTED
STREPTOCOCCUS PNEUMONIAE: NOT DETECTED
Serratia marcescens: NOT DETECTED
Staphylococcus aureus (BCID): NOT DETECTED
Streptococcus agalactiae: NOT DETECTED
Streptococcus pyogenes: NOT DETECTED
Streptococcus species: NOT DETECTED
VANCOMYCIN RESISTANCE: NOT DETECTED

## 2018-04-30 LAB — URINALYSIS, ROUTINE W REFLEX MICROSCOPIC
Bilirubin Urine: NEGATIVE
GLUCOSE, UA: NEGATIVE mg/dL
Hgb urine dipstick: NEGATIVE
KETONES UR: NEGATIVE mg/dL
LEUKOCYTES UA: NEGATIVE
NITRITE: NEGATIVE
PH: 5 (ref 5.0–8.0)
Protein, ur: NEGATIVE mg/dL

## 2018-04-30 LAB — ECHOCARDIOGRAM COMPLETE
HEIGHTINCHES: 69 in
Weight: 2784 oz

## 2018-04-30 LAB — COMPREHENSIVE METABOLIC PANEL
ALBUMIN: 2.5 g/dL — AB (ref 3.5–5.0)
ALK PHOS: 66 U/L (ref 38–126)
ALT: 10 U/L — ABNORMAL LOW (ref 17–63)
ANION GAP: 8 (ref 5–15)
AST: 12 U/L — AB (ref 15–41)
BUN: 8 mg/dL (ref 6–20)
CALCIUM: 8.4 mg/dL — AB (ref 8.9–10.3)
CO2: 26 mmol/L (ref 22–32)
Chloride: 101 mmol/L (ref 101–111)
Creatinine, Ser: 0.84 mg/dL (ref 0.61–1.24)
GFR calc Af Amer: >60 mL/min (ref >60)
GFR calc non Af Amer: >60 mL/min (ref >60)
GLUCOSE: 113 mg/dL — AB (ref 65–99)
Potassium: 3.7 mmol/L (ref 3.5–5.1)
SODIUM: 135 mmol/L (ref 135–145)
Total Bilirubin: 0.3 mg/dL (ref 0.3–1.2)
Total Protein: 6.3 g/dL — ABNORMAL LOW (ref 6.5–8.1)

## 2018-04-30 LAB — CBC WITH DIFFERENTIAL/PLATELET
ABS IMMATURE GRANULOCYTES: 0 10*3/uL (ref 0.0–0.1)
BASOS PCT: 1 %
Basophils Absolute: 0 10*3/uL (ref 0.0–0.1)
Eosinophils Absolute: 0.3 10*3/uL (ref 0.0–0.7)
Eosinophils Relative: 3 %
HCT: 31.9 % — ABNORMAL LOW (ref 39.0–52.0)
HEMOGLOBIN: 10.2 g/dL — AB (ref 13.0–17.0)
Immature Granulocytes: 1 %
LYMPHS PCT: 22 %
Lymphs Abs: 1.9 10*3/uL (ref 0.7–4.0)
MCH: 26.2 pg (ref 26.0–34.0)
MCHC: 32 g/dL (ref 30.0–36.0)
MCV: 81.8 fL (ref 78.0–100.0)
MONO ABS: 0.7 10*3/uL (ref 0.1–1.0)
MONOS PCT: 8 %
NEUTROS ABS: 5.6 10*3/uL (ref 1.7–7.7)
Neutrophils Relative %: 65 %
Platelets: 286 10*3/uL (ref 150–400)
RBC: 3.9 MIL/uL — ABNORMAL LOW (ref 4.22–5.81)
RDW: 14.3 % (ref 11.5–15.5)
WBC: 8.5 10*3/uL (ref 4.0–10.5)

## 2018-04-30 LAB — IRON AND TIBC
IRON: 24 ug/dL — AB (ref 45–182)
Saturation Ratios: 12 % — ABNORMAL LOW (ref 17.9–39.5)
TIBC: 204 ug/dL — AB (ref 250–450)
UIBC: 180 ug/dL

## 2018-04-30 LAB — SEDIMENTATION RATE: Sed Rate: 75 mm/hr — ABNORMAL HIGH (ref 0–16)

## 2018-04-30 LAB — VITAMIN B12: VITAMIN B 12: 243 pg/mL (ref 180–914)

## 2018-04-30 LAB — RAPID URINE DRUG SCREEN, HOSP PERFORMED
AMPHETAMINES: POSITIVE — AB
BARBITURATES: NOT DETECTED
BENZODIAZEPINES: NOT DETECTED
COCAINE: POSITIVE — AB
Opiates: NOT DETECTED
Tetrahydrocannabinol: POSITIVE — AB

## 2018-04-30 LAB — STREP PNEUMONIAE URINARY ANTIGEN: Strep Pneumo Urinary Antigen: NEGATIVE

## 2018-04-30 LAB — RETICULOCYTES
RBC.: 3.9 MIL/uL — ABNORMAL LOW (ref 4.22–5.81)
RETIC COUNT ABSOLUTE: 42.9 10*3/uL (ref 19.0–186.0)
Retic Ct Pct: 1.1 % (ref 0.4–3.1)

## 2018-04-30 LAB — FERRITIN: Ferritin: 111 ng/mL (ref 24–336)

## 2018-04-30 LAB — FOLATE: FOLATE: 8.9 ng/mL (ref >5.9)

## 2018-04-30 LAB — HIV ANTIBODY (ROUTINE TESTING W REFLEX): HIV Screen 4th Generation wRfx: NONREACTIVE

## 2018-04-30 MED ORDER — ACETAMINOPHEN 325 MG PO TABS
650.0000 mg | ORAL_TABLET | Freq: Four times a day (QID) | ORAL | Status: DC | PRN
  Administered 2018-05-02 – 2018-05-16 (×4): 650 mg via ORAL
  Filled 2018-04-30 (×5): qty 2

## 2018-04-30 MED ORDER — SODIUM CHLORIDE 0.9 % IV SOLN
2.0000 g | INTRAVENOUS | Status: DC
  Administered 2018-04-30 – 2018-05-25 (×146): 2 g via INTRAVENOUS
  Filled 2018-04-30 (×157): qty 2000

## 2018-04-30 MED ORDER — VANCOMYCIN HCL IN DEXTROSE 1-5 GM/200ML-% IV SOLN: 1000.0000 mg | Freq: Three times a day (TID) | INTRAVENOUS | Status: DC

## 2018-04-30 MED ORDER — GENTAMICIN IN SALINE 1-0.9 MG/ML-% IV SOLN
100.0000 mg | Freq: Two times a day (BID) | INTRAVENOUS | Status: DC
  Administered 2018-04-30 – 2018-05-21 (×42): 100 mg via INTRAVENOUS
  Filled 2018-04-30 (×44): qty 100

## 2018-04-30 MED ORDER — METHADONE HCL 10 MG PO TABS
100.0000 mg | ORAL_TABLET | Freq: Every day | ORAL | Status: DC
  Administered 2018-04-30 – 2018-05-17 (×18): 100 mg via ORAL
  Filled 2018-04-30 (×19): qty 10

## 2018-04-30 MED ORDER — ACETAMINOPHEN 650 MG RE SUPP: 650.0000 mg | Freq: Four times a day (QID) | RECTAL | Status: DC | PRN

## 2018-04-30 MED ORDER — ENOXAPARIN SODIUM 40 MG/0.4ML ~~LOC~~ SOLN
40.0000 mg | SUBCUTANEOUS | Status: AC
  Administered 2018-04-30 – 2018-05-15 (×15): 40 mg via SUBCUTANEOUS
  Filled 2018-04-30 (×15): qty 0.4

## 2018-04-30 MED ORDER — ONDANSETRON HCL 4 MG/2ML IJ SOLN: 4.0000 mg | Freq: Four times a day (QID) | INTRAMUSCULAR | Status: DC | PRN

## 2018-04-30 MED ORDER — NICOTINE 21 MG/24HR TD PT24
21.0000 mg | MEDICATED_PATCH | Freq: Every day | TRANSDERMAL | Status: DC
  Administered 2018-04-30 – 2018-05-06 (×7): 21 mg via TRANSDERMAL
  Filled 2018-04-30 (×7): qty 1

## 2018-04-30 MED ORDER — ONDANSETRON HCL 4 MG PO TABS: 4.0000 mg | ORAL_TABLET | Freq: Four times a day (QID) | ORAL | Status: DC | PRN

## 2018-04-30 MED ORDER — VANCOMYCIN HCL 10 G IV SOLR
1500.0000 mg | Freq: Once | INTRAVENOUS | Status: AC
  Administered 2018-04-30: 1500 mg via INTRAVENOUS
  Filled 2018-04-30: qty 1500

## 2018-04-30 MED ORDER — SODIUM CHLORIDE 0.9 % IV SOLN
INTRAVENOUS | Status: AC
  Administered 2018-04-30 (×2): via INTRAVENOUS

## 2018-04-30 NOTE — ED Notes (Addendum)
Pt with brief 1 min episode where hr increased to 185.  Denied chest pain or sob.  HR immediately reduced when monitor noise and iv pump beeping was turned off.  MD notified.

## 2018-04-30 NOTE — Progress Notes (Signed)
Pharmacy Antibiotic Note  Jeffery Bennett is a 41 YOM with hx IVDA who presented with L-shoulder pain and R-foot lesions. Also noted to have a murmur concerning for endocarditis. TTE done - no results yet. Currently on Vancomycin monotherapy - BCID showing Enterococcus with no resistance mechanisms noted. Pharmacy has been consulted to transition the patient to Ampicillin + Gentamicin.  The patient now with BCID showing Enterococcus - no resistance mechanisms noted. High risk for IE given IVDA and murmur on exam. Awaiting TTE/TEE for evaluation. Renal function stable. Estimated CrCl>100 ml/min. IBW~70 kg.  Plan: 1. Start Ampicillin 2g IV every 4 hours 2. Start Gentamicin 100 mg IV every 12 hours for synergy dosing 3. Will continue to follow renal function, culture results, LOT, and antibiotic de-escalation plans   Height:  (175.3 cm) Weight: 174 lb (78.9 kg) IBW/kg (Calculated) : 70.7  Temp (24hrs), Avg:98.2 F (36.8 C), Min:98 F (36.7 C), Max:98.3 F (36.8 C)  Recent Labs  Lab 04/29/18 1846 04/30/18 0232  WBC 11.3* 8.5  CREATININE 0.95 0.84    Estimated Creatinine Clearance: 121.6 mL/min (by C-G formula based on SCr of 0.84 mg/dL).    No Known Allergies  Antimicrobials this admission: Vanc 5/20 x 1 Ampicillin 5/20 >> Gentamicin 5/20 >>  Dose adjustments this admission: N/a  Microbiology results: 5/19 BCx >> 2/2 GPC in chains (BCID Enterococcus)  Thank you for allowing pharmacy to be a part of this patient's care.  Georgina Pillion, PharmD, BCPS Clinical Pharmacist Pager: [phone] Clinical phone for 04/30/2018 from 7a-3:30p: [phone] If after 3:30p, please call main pharmacy at: [phone] 04/30/2018 12:12 PM

## 2018-04-30 NOTE — Progress Notes (Signed)
Patient admitted after midnight, please see H&P.  Suspected endocarditis official echo read pending but had murmur.  Blood cultures are positive.  Has janeway lesions on right foot.  ID consult  Marlin Canary DO

## 2018-04-30 NOTE — Clinical Social Work Note (Signed)
Clinical Social Work Assessment  Patient Details  Name: Jeffery Bennett MRN: [id number] Date of Birth: Sep 24, 1981  Date of referral:  04/30/18               Reason for consult:  Substance Use/ETOH Abuse                Permission sought to share information with:  Family Supports Permission granted to share information::  Yes, Verbal Permission Granted  Name::     Education officer, museum::  family   Relationship::  significant other  Contact Information:  Jeffery Bennett [id number]  Housing/Transportation Living arrangements for the past 2 months:  Single Family Home(with significant other.) Source of Information:  Patient Patient Interpreter Needed:  None Criminal Activity/Legal Involvement Pertinent to Current Situation/Hospitalization:  No - Comment as needed Significant Relationships:  Siblings, Significant Other Lives with:  Significant Other Do you feel safe going back to the place where you live?  Yes Need for family participation in patient care:  Yes (Comment)  Care giving concerns:  CSW spoke with pt at bedside.At this time pt denies having any concerns at this time.    Social Worker assessment / plan:  CSW spoke with pt at bedside. During this time CSW was informed that pt is from home with significant other. Pt expressed that they have lived together for 3 years. Pt identified Jeffery Bennett (significnat other) and sister as positive supporters for pt. Pt mentioned that pt has been getting drug rehab at [facility]. Pt expressed that [employer] has been very helpful to pt and pt plans to stay in the program for further assistance with drug use.   During this assessment pt was lying in bed watching tv. Pt answered questions but was short and very direct with CSW. Pt denied needing any further resources for substance use at this time.   Employment status:  Other (Comment)(unknown at this time. ) Insurance information:  Self Pay (Medicaid Pending) PT Recommendations:   Not assessed at this time Information / Referral to community resources:     Patient/Family's Response to care:  Pt's response to care appeared to be clear however pt presented with some agitation about alarms going off in room during the assessment.   Patient/Family's Understanding of and Emotional Response to Diagnosis, Current Treatment, and Prognosis:  Pt's emotional response was direct. Pt's understanding of care was clear as pt presented no further questions in regards to care via CSW at this time.   Emotional Assessment Appearance:  Appears stated age Attitude/Demeanor/Rapport:  Angry Affect (typically observed):  Blunt Orientation:  Oriented to Situation, Oriented to Self, Oriented to Place, Oriented to  Time Alcohol / Substance use:  Illicit Drugs(heroin) Psych involvement (Current and /or in the community):  No (Comment)(not at this time. )  Discharge Needs  Concerns to be addressed:  Denies Needs/Concerns at this time Readmission within the last 30 days:  No Current discharge risk:  Substance Abuse Barriers to Discharge:  Continued Medical Work up   [employer], LCSWA 04/30/2018, 7:57 AM

## 2018-04-30 NOTE — Progress Notes (Signed)
New Admission Note:   Arrival Method: This RN transported patient from 5C15 to [phone] via wheelchair Mental Orientation: Alert and oriented x 4 Telemetry: 42M13 x 2 verification Assessment: completed Skin: intact, noted swelling to right foot which patient favors when ambulating and complains of pain IV:  Right AC infusing NS at 50 mL/hr Pain: 9/10 Right foot Tubes: n/a Safety Measures: bed in low position, call bell in reach, patient low fall risk Admission: completed 42M Orientation: done Family: significant other at bedside, contact number verified in EPIC Personal Belongings: clothing and pillows  Orders have been reviewed and implemented. Will continue to monitor the patient. Call light has been placed within reach and bed alarm has been activated.   Hortencia Conradi, RN MSN CNE [id number] Phone number: [phone]

## 2018-04-30 NOTE — ED Notes (Signed)
Came into room to hang antibiotics. Friend seen leaving room. Pt. Very pleasant and apologized for the language he used towards me. Pt. Appears drowsy and relaxed. Will continue to monitor

## 2018-04-30 NOTE — ED Notes (Signed)
Pt significant other brought food from home, pt eating, nad

## 2018-04-30 NOTE — Progress Notes (Signed)
  Echocardiogram 2D Echocardiogram has been performed.  Leta Jungling M 04/30/2018, 9:53 AM

## 2018-04-30 NOTE — ED Notes (Signed)
Meal tray ordered 
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
## 2018-04-30 NOTE — ED Notes (Signed)
Pt's dinner arrived. °

## 2018-04-30 NOTE — Progress Notes (Signed)
PHARMACY - PHYSICIAN COMMUNICATION CRITICAL VALUE ALERT - BLOOD CULTURE IDENTIFICATION (BCID)  Jeffery Bennett is an 37 y.o. male who presented to [facility] on 04/29/2018 with a chief complaint of L-shoulder pain  Assessment: 72 YOM with hx IVDA who presented with L-shoulder pain and R-foot lesions. Also noted to have a murmur concerning for endocarditis. TTE done - no results yet. Currently on Vancomycin monotherapy - BCID showing Enterococcus with no resistance mechanisms noted.    Name of physician (or Provider) Contacted: Snider(ID) and Benjamine Mola  Current antibiotics: Vancomycin  Changes to prescribed antibiotics recommended:  D/c Vancomycin and change to Ampicillin + Gentamicin for synergy given high risk of IE for now - ID will see and adjust regimen further if needed  Results for orders placed or performed during the hospital encounter of 04/29/18  Blood Culture ID Panel (Reflexed) (Collected: 04/29/2018  6:46 PM)  Result Value Ref Range   Enterococcus species DETECTED (A) NOT DETECTED   Vancomycin resistance NOT DETECTED NOT DETECTED   Listeria monocytogenes NOT DETECTED NOT DETECTED   Staphylococcus species NOT DETECTED NOT DETECTED   Staphylococcus aureus NOT DETECTED NOT DETECTED   Streptococcus species NOT DETECTED NOT DETECTED   Streptococcus agalactiae NOT DETECTED NOT DETECTED   Streptococcus pneumoniae NOT DETECTED NOT DETECTED   Streptococcus pyogenes NOT DETECTED NOT DETECTED   Acinetobacter baumannii NOT DETECTED NOT DETECTED   Enterobacteriaceae species NOT DETECTED NOT DETECTED   Enterobacter cloacae complex NOT DETECTED NOT DETECTED   Escherichia coli NOT DETECTED NOT DETECTED   Klebsiella oxytoca NOT DETECTED NOT DETECTED   Klebsiella pneumoniae NOT DETECTED NOT DETECTED   Proteus species NOT DETECTED NOT DETECTED   Serratia marcescens NOT DETECTED NOT DETECTED   Haemophilus influenzae NOT DETECTED NOT DETECTED   Neisseria meningitidis NOT DETECTED NOT DETECTED    Pseudomonas aeruginosa NOT DETECTED NOT DETECTED   Candida albicans NOT DETECTED NOT DETECTED   Candida glabrata NOT DETECTED NOT DETECTED   Candida krusei NOT DETECTED NOT DETECTED   Candida parapsilosis NOT DETECTED NOT DETECTED   Candida tropicalis NOT DETECTED NOT DETECTED   Thank you for allowing pharmacy to be a part of this patient's care.  Georgina Pillion, PharmD, BCPS Clinical Pharmacist Pager: [phone] Clinical phone for 04/30/2018 from 7a-3:30p: [phone] If after 3:30p, please call main pharmacy at: [phone] 04/30/2018 11:52 AM
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
## 2018-04-30 NOTE — ED Notes (Signed)
Admitting MD at bedside.

## 2018-04-30 NOTE — Progress Notes (Signed)
Pharmacy Antibiotic Note  Jeffery Bennett is a 37 y.o. male admitted on 04/29/2018 with concern for endocarditis.  Pharmacy has been consulted for vancomycin dosing.  Plan: Vancomycin  x1 then  IV every 8 hours.  Goal trough 15-20 mcg/mL.  Height:  (175.3 cm) Weight: 174 lb (78.9 kg) IBW/kg (Calculated) : 70.7  Temp (24hrs), Avg:98.3 F (36.8 C), Min:98.3 F (36.8 C), Max:98.3 F (36.8 C)  Recent Labs  Lab 04/29/18 1846  WBC 11.3*  CREATININE 0.95    Estimated Creatinine Clearance: 107.5 mL/min (by C-G formula based on SCr of 0.95 mg/dL).    No Known Allergies   Thank you for allowing pharmacy to be a part of this patient's care.  Vernard Gambles, PharmD, BCPS  04/30/2018 2:35 AM

## 2018-05-01 LAB — CBC
HCT: 32.8 % — ABNORMAL LOW (ref 39.0–52.0)
HEMOGLOBIN: 10.5 g/dL — AB (ref 13.0–17.0)
MCH: 26.3 pg (ref 26.0–34.0)
MCHC: 32 g/dL (ref 30.0–36.0)
MCV: 82 fL (ref 78.0–100.0)
Platelets: 287 10*3/uL (ref 150–400)
RBC: 4 MIL/uL — ABNORMAL LOW (ref 4.22–5.81)
RDW: 13.8 % (ref 11.5–15.5)
WBC: 8.1 10*3/uL (ref 4.0–10.5)

## 2018-05-01 LAB — BASIC METABOLIC PANEL
ANION GAP: 9 (ref 5–15)
BUN: 7 mg/dL (ref 6–20)
CALCIUM: 8.5 mg/dL — AB (ref 8.9–10.3)
CO2: 26 mmol/L (ref 22–32)
CREATININE: 0.88 mg/dL (ref 0.61–1.24)
Chloride: 102 mmol/L (ref 101–111)
GFR calc non Af Amer: >60 mL/min (ref >60)
GLUCOSE: 130 mg/dL — AB (ref 65–99)
Potassium: 3.7 mmol/L (ref 3.5–5.1)
Sodium: 137 mmol/L (ref 135–145)

## 2018-05-01 LAB — LEGIONELLA PNEUMOPHILA SEROGP 1 UR AG: L. PNEUMOPHILA SEROGP 1 UR AG: NEGATIVE

## 2018-05-01 NOTE — Progress Notes (Signed)
    [facility] has been requested to perform a transesophageal echocardiogram on 05/04/18 for evaluation of bacteremia.  After careful review of history and examination, the risks and benefits of transesophageal echocardiogram have been explained including risks of esophageal damage, perforation (1:10,000 risk), bleeding, pharyngeal hematoma as well as other potential complications associated with conscious sedation including aspiration, arrhythmia, respiratory failure and death. Alternatives to treatment were discussed, questions were answered. Patient is willing to proceed.   TEE scheduled for 05/04/18 at 8:00am with Dr. Eden Emms.  Judy Pimple, PA-C 05/01/2018 3:22 PM

## 2018-05-01 NOTE — Progress Notes (Signed)
Progress Note    Jeffery Bennett  XLK:[id number] DOB: [birth year]  DOA: 04/29/2018 PCP: Patient, No Pcp Per    Brief Narrative:    Medical records reviewed and are as summarized below:  Jeffery Bennett is an 37 y.o. 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  Patient admits to have taken IV drugs crystal meth 2 days ago following which patient started noticing some lesions on the right foot which are painful as well as left shoulder pain.      Assessment/Plan:   Principal Problem:   SIRS (systemic inflammatory response syndrome) (HCC) Active Problems:   Polysubstance abuse (HCC)   Hepatitis C virus infection without hepatic coma   Chest pain   Normochromic normocytic anemia   Pneumonitis   bacteremia/endocarditis -cultures positive for enterococcus -ID consulted -abx changed to amp/gent -TEE planned for Friday as echo showed: Aortic valve: Poorly visualized AV but there appears to be a 0.63 x 0.61cm mass on the ventricular side of the AV consistent with vegetation with severe AI Regurgitation pressure half-time: 208 ms. -Sed rate is elevated.  -CT chest suspicious for septic emboli  Normocytic normochromic anemia  -trend CBC  IV drug abuse -social work counseling    Body mass index is 25.7 kg/m.   Family Communication/Anticipated D/C date and plan/Code Status   DVT prophylaxis: Lovenox ordered. Code Status: Full Code.  Family Communication: mother in law at bedside Disposition Plan:    Medical Consultants:    ID  Cards for TEE     Subjective:   No current complaints, no pain  Objective:    Vitals:   04/30/18 1539 04/30/18 1715 04/30/18 2013 05/01/18 0518  BP: (!) 117/55 (!) 114/51 109/60 115/62  Pulse: 92 93 93 85  Resp: (!) Temp:  98.2 F (36.8 C) 98.1 F (36.7 C) 97.9 F (36.6 C)  TempSrc:  Oral Oral Oral  SpO2: 93% 96% 99% 94%    Weight:   79 kg (174 lb 1 oz)   Height:        Intake/Output Summary (Last 24 hours) at 05/01/2018 1445 Last data filed at 05/01/2018 0600 Gross per 24 hour  Intake 1758.34 ml  Output 1075 ml  Net 683.34 ml   Filed Weights   04/29/18 1633 04/30/18 2013  Weight: 78.9 kg (174 lb) 79 kg (174 lb 1 oz)    Exam: In bed, NAD Rrr, + murmur Right foot with what appears to be emboli-- on sole and toes +Bs, soft  Data Reviewed:   I have personally reviewed following labs and imaging studies:  Labs: Labs show the following:   Basic Metabolic Panel: Recent Labs  Lab 04/29/18 1846 04/30/18 0232 05/01/18 0602  NA 136 135 137  K 3.8 3.7 3.7  CL 100* 101 102  CO2 GLUCOSE 102* 113* 130*  BUN CREATININE 0.95 0.84 0.88  CALCIUM 9.0 8.4* 8.5*   GFR Estimated Creatinine Clearance: 116 mL/min (by C-G formula based on SCr of 0.88 mg/dL). Liver Function Tests: Recent Labs  Lab 04/29/18 1846 04/30/18 0232  AST 13* 12*  ALT 12* 10*  ALKPHOS 73 66  BILITOT 0.4 0.3  PROT 7.4 6.3*  ALBUMIN 2.9* 2.5*   Recent Labs  Lab 04/29/18 1846  LIPASE 24   No results for input(s): AMMONIA in the last 168 hours. Coagulation  profile No results for input(s): INR, PROTIME in the last 168 hours.  CBC: Recent Labs  Lab 04/29/18 1846 04/30/18 0232 05/01/18 0602  WBC 11.3* 8.5 8.1  NEUTROABS 8.2* 5.6  --   HGB 11.1* 10.2* 10.5*  HCT 35.1* 31.9* 32.8*  MCV 82.2 81.8 82.0  PLT 321 286 287   Cardiac Enzymes: No results for input(s): CKTOTAL, CKMB, CKMBINDEX, TROPONINI in the last 168 hours. BNP (last 3 results) No results for input(s): PROBNP in the last 8760 hours. CBG: No results for input(s): GLUCAP in the last 168 hours. D-Dimer: No results for input(s): DDIMER in the last 72 hours. Hgb A1c: No results for input(s): HGBA1C in the last 72 hours. Lipid Profile: No results for input(s): CHOL, HDL, LDLCALC, TRIG, CHOLHDL, LDLDIRECT in the last 72 hours. Thyroid  function studies: No results for input(s): TSH, T4TOTAL, T3FREE, THYROIDAB in the last 72 hours.  Invalid input(s): FREET3 Anemia work up: Recent Labs    04/30/18 0232 04/30/18 0434  VITAMINB12 243  --   FOLATE  --  8.9  FERRITIN 111  --   TIBC 204*  --   IRON 24*  --   RETICCTPCT 1.1  --    Sepsis Labs: Recent Labs  Lab 04/29/18 1846 04/30/18 0232 05/01/18 0602  WBC 11.3* 8.5 8.1    Microbiology Recent Results (from the past 240 hour(s))  Blood culture (routine x 2)     Status: None (Preliminary result)   Collection Time: 04/29/18  6:46 PM  Result Value Ref Range Status   Specimen Description BLOOD BLOOD RIGHT FOREARM  Final   Special Requests   Final    BOTTLES DRAWN AEROBIC AND ANAEROBIC Blood Culture adequate volume   Culture  Setup Time   Final    GRAM POSITIVE COCCI IN CHAINS IN BOTH AEROBIC AND ANAEROBIC BOTTLES CRITICAL RESULT CALLED TO, READ BACK BY AND VERIFIED WITH: MARTIN PHARMD AT 1136 ON 052019 BY SJW PT IN ED ADMITTED TO MC CALLED [facility] AND MC Performed at [facility], [address] [place], [place] [address]    Culture GRAM POSITIVE COCCI IN CHAINS  Final   Report Status PENDING  Incomplete  Blood Culture ID Panel (Reflexed)     Status: Abnormal   Collection Time: 04/29/18  6:46 PM  Result Value Ref Range Status   Enterococcus species DETECTED (A) NOT DETECTED Final    Comment: CRITICAL RESULT CALLED TO, READ BACK BY AND VERIFIED WITH: KELLY CONNOR RN AT 1135 ON [id number] BY SJW    Vancomycin resistance NOT DETECTED NOT DETECTED Final   Listeria monocytogenes NOT DETECTED NOT DETECTED Final   Staphylococcus species NOT DETECTED NOT DETECTED Final   Staphylococcus aureus NOT DETECTED NOT DETECTED Final   Streptococcus species NOT DETECTED NOT DETECTED Final   Streptococcus agalactiae NOT DETECTED NOT DETECTED Final   Streptococcus pneumoniae NOT DETECTED NOT DETECTED Final   Streptococcus pyogenes NOT DETECTED NOT DETECTED Final    Acinetobacter baumannii NOT DETECTED NOT DETECTED Final   Enterobacteriaceae species NOT DETECTED NOT DETECTED Final   Enterobacter cloacae complex NOT DETECTED NOT DETECTED Final   Escherichia coli NOT DETECTED NOT DETECTED Final   Klebsiella oxytoca NOT DETECTED NOT DETECTED Final   Klebsiella pneumoniae NOT DETECTED NOT DETECTED Final   Proteus species NOT DETECTED NOT DETECTED Final   Serratia marcescens NOT DETECTED NOT DETECTED Final   Haemophilus influenzae NOT DETECTED NOT DETECTED Final   Neisseria meningitidis NOT DETECTED NOT DETECTED Final  Pseudomonas aeruginosa NOT DETECTED NOT DETECTED Final   Candida albicans NOT DETECTED NOT DETECTED Final   Candida glabrata NOT DETECTED NOT DETECTED Final   Candida krusei NOT DETECTED NOT DETECTED Final   Candida parapsilosis NOT DETECTED NOT DETECTED Final   Candida tropicalis NOT DETECTED NOT DETECTED Final    Comment: Performed at [facility], [address] [place], [place] [address]    Procedures and diagnostic studies:  Dg Chest 2 View  Result Date: 04/29/2018 CLINICAL DATA:  Patient with left shoulder pain. EXAM: CHEST - 2 VIEW COMPARISON:  Chest radiograph 01/21/2016. FINDINGS: Stable cardiac and mediastinal contours. Heterogeneous opacities right lung base. No pleural effusion or pneumothorax. Thoracic spine degenerative changes. Surgical hardware left humerus. IMPRESSION: Heterogeneous opacities right lung base may represent scarring. Infection not excluded. Electronically Signed   By: Annia Belt M.D.   On: 04/29/2018 17:20   Ct Angio Chest Pe W/cm &/or Wo Cm  Result Date: 04/29/2018 CLINICAL DATA:  Dyspnea. Left-sided chest/shoulder pain. High pretest probability for pulmonary embolus. EXAM: CT ANGIOGRAPHY CHEST WITH CONTRAST TECHNIQUE: Multidetector CT imaging of the chest was performed using the standard protocol during bolus administration of intravenous contrast. Multiplanar CT image reconstructions and MIPs were  obtained to evaluate the vascular anatomy. CONTRAST:  ISOVUE-370 IOPAMIDOL (ISOVUE-370) INJECTION 76% COMPARISON:  Radiographs 04/29/2018.  Chest CT 01/14/2016 FINDINGS: Cardiovascular: There are no filling defects within the pulmonary arteries to suggest pulmonary embolus. Thoracic aorta is normal in caliber without dissection or aneurysm. The heart is normal in size. No pericardial effusion. Mediastinum/Nodes: Prominent right lower hilar lymph node measures 10 mm short axis, may be reactive. No mediastinal adenopathy. The esophagus is decompressed. No visualized thyroid nodule. Lungs/Pleura: Slight heterogeneous attenuation of lung parenchyma. Areas of bronchial filling in the right lower lobe subsegmental branches, with small rounded intraluminal densities within the right middle lobe bronchus, right and left lower lobe bronchus. Bronchial thickening in the lower lobe bronchi with scattered mucous plugging. Heterogeneous densities in the dependent lower lobes may be atelectasis or infectious. No pulmonary edema. No significant pleural fluid. Upper Abdomen: No acute abnormality. Musculoskeletal: Remote left rib fractures. Remote proximal left humerus fracture post ORIF. There are no acute or suspicious osseous abnormalities. Review of the MIP images confirms the above findings. IMPRESSION: 1. No pulmonary embolus. 2. Heterogeneous lung parenchyma can be seen in the setting of small airways disease. Bronchial thickening in the lower lobes, with areas of mucous plugging. Small rounded intraluminal densities in the right middle, right and left lower lobe bronchi, may be retained secretions or aspiration in the appropriate clinical setting. 3. Dependent lower lobe densities may be atelectasis or infectious pneumonitis, possibly related to bronchial thickening and mucous plugging. Electronically Signed   By: Rubye Oaks M.D.   On: 04/29/2018 22:30   Dg Foot 2 Views Right  Result Date: 04/29/2018 CLINICAL  DATA:  Right foot pain EXAM: RIGHT FOOT - 2 VIEW COMPARISON:  None. FINDINGS: No fracture or malalignment. Joint spaces are maintained. Soft tissues are unremarkable. IMPRESSION: Negative. Electronically Signed   By: Jasmine Pang M.D.   On: 04/29/2018 19:41    Medications:   . enoxaparin (LOVENOX) injection  40 mg Subcutaneous Q24H  . methadone  100 mg Oral Daily  . nicotine  21 mg Transdermal Daily   Continuous Infusions: . ampicillin (OMNIPEN) IV 2 g (05/01/18 1221)  . gentamicin Stopped (05/01/18 0600)     LOS: 1 day   Joseph Art  [facility]   *Please refer to [email or web address], password [email or web address] to get updated schedule on who will round on this patient, as hospitalists switch teams weekly. If 7PM-7AM, please contact night-coverage at [email or web address], password [email or web address] for any overnight needs.  05/01/2018, 2:45 PM

## 2018-05-01 NOTE — Progress Notes (Signed)
TEE scheduled for Friday

## 2018-05-01 NOTE — Care Management Note (Addendum)
Case Management Note  Patient Details  Name: Jeffery Bennett MRN: [id number] Date of Birth: August 14, 1981  Subjective/Objective:    History of systolic murmur with IV drug abuse, etoh/tobacco abuse, Hep C; Admitted for SIRS (systemic inflammatory response syndrome).  No PCP noted. Patient uninsured.        Action/Plan: Clinical Social worker/Cardiology consulted. 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  Sed rate is elevated. CT chest suspicious for septic emboli.    Prior to admission patient lived at home with significant other, and mentioned that he has been getting drug rehab at [facility]; plans to stay in the program for further assistance with drug use.  Patient is self-pay, medicaid pending. At discharge patient plans to return to same living situation.  No PCP noted, information to [facility] for uninsured patients provided to patient.  NCM will continue to follow for discharge transition needs.    Expected Discharge Date:   To Be determined.              Expected Discharge Plan:   To Be determined  In-House Referral:  Clinical Social Work  Discharge planning Services  CM Consult  Status of Service:  In process, will continue to follow  Yancey Flemings, RN 05/01/2018, 4:02 PM

## 2018-05-02 DIAGNOSIS — I330 Acute and subacute infective endocarditis: Secondary | ICD-10-CM | POA: Insufficient documentation

## 2018-05-02 DIAGNOSIS — I2690 Septic pulmonary embolism without acute cor pulmonale: Secondary | ICD-10-CM

## 2018-05-02 LAB — BASIC METABOLIC PANEL
ANION GAP: 10 (ref 5–15)
BUN: 6 mg/dL (ref 6–20)
CALCIUM: 8.8 mg/dL — AB (ref 8.9–10.3)
CO2: 28 mmol/L (ref 22–32)
Chloride: 100 mmol/L — ABNORMAL LOW (ref 101–111)
Creatinine, Ser: 0.88 mg/dL (ref 0.61–1.24)
GFR calc non Af Amer: >60 mL/min (ref >60)
Glucose, Bld: 113 mg/dL — ABNORMAL HIGH (ref 65–99)
POTASSIUM: 4.2 mmol/L (ref 3.5–5.1)
Sodium: 138 mmol/L (ref 135–145)

## 2018-05-02 LAB — CBC
HCT: 32.5 % — ABNORMAL LOW (ref 39.0–52.0)
Hemoglobin: 10.3 g/dL — ABNORMAL LOW (ref 13.0–17.0)
MCH: 26.1 pg (ref 26.0–34.0)
MCHC: 31.7 g/dL (ref 30.0–36.0)
MCV: 82.3 fL (ref 78.0–100.0)
PLATELETS: 278 10*3/uL (ref 150–400)
RBC: 3.95 MIL/uL — AB (ref 4.22–5.81)
RDW: 13.8 % (ref 11.5–15.5)
WBC: 9 10*3/uL (ref 4.0–10.5)

## 2018-05-02 LAB — CULTURE, BLOOD (ROUTINE X 2): Special Requests: ADEQUATE

## 2018-05-02 LAB — GENTAMICIN LEVEL, TROUGH: GENTAMICIN TR: 0.5 ug/mL (ref 0.5–2.0)

## 2018-05-02 NOTE — Progress Notes (Signed)
    [facility]    Date of Admission:  04/29/2018   Total days of antibiotics 3 of amp/gent           ID: Jeffery Bennett is a 37 y.o. 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 enoxaparin (LOVENOX) injection  40 mg Subcutaneous Q24H  . methadone  100 mg Oral Daily  . nicotine  21 mg Transdermal Daily    Objective: Vital signs in last 24 hours: Temp:  [97.7 F (36.5 C)-98.1 F (36.7 C)] 98.1 F (36.7 C) (05/22 2021) Pulse Rate:  [77-90] 83 (05/22 2021) Resp:  [16-21] 18 (05/22 2021) BP: (99-111)/(46-56) 101/49 (05/22 2021) SpO2:  [95 %-97 %] 95 % (05/22 2021) Weight:  [174 lb 9.7 oz (79.2 kg)-174 lb 9.8 oz (79.2 kg)] 174 lb 9.8 oz (79.2 kg) (05/22 2021) Physical Exam  Constitutional: He is oriented to person, place, and time. He appears well-developed and well-nourished. No distress.  HENT:  Mouth/Throat: Oropharynx is clear and moist. No oropharyngeal exudate.  Cardiovascular: Normal rate, regular rhythm and normal heart sounds. Systolic murmur Pulmonary/Chest: Effort normal and breath sounds normal. No respiratory distress. He has no wheezes.  Abdominal: Soft. Bowel sounds are normal. He exhibits no distension. There is no tenderness.  Lymphadenopathy:  He has no cervical adenopathy.  Neurological: He is alert and oriented to person, place, and time.  Skin: Small pinpoint lesions to right plantar aspect of foot, less swelling and tenderness Psychiatric: He has a normal mood and affect. His behavior is normal.     Lab Results Recent Labs    05/01/18 0602 05/02/18 0335  WBC 8.1 9.0  HGB 10.5* 10.3*    HCT 32.8* 32.5*  NA 137 138  K 3.7 4.2  CL 102 100*  CO2 26 28  BUN 7 6  CREATININE 0.88 0.88   Liver Panel Recent Labs    04/30/18 0232  PROT 6.3*  ALBUMIN 2.5*  AST 12*  ALT 10*  ALKPHOS 66  BILITOT 0.3   Sedimentation Rate No results for input(s): ESRSEDRATE in the last 72 hours. C-Reactive Protein No results for input(s): CRP in the last 72 hours.  Microbiology: 5/19 blood cx efaecalis amp S, gent S 5/21 blood cx pending Studies/Results: No results found. TTE on 5/20 Aortic valve: Poorly visualized AV but there appears to be a 0.63   x 0.61cm mass on the ventricular side of the AV consistent with   vegetation with severe AI Regurgitation pressure half-time: 208   ms. Assessment/Plan: Native AV endocarditis with pulmonary emboli and osler's nodes to right foot = continue to plan to treat with ampicillin plus gentamicin. He is slated for TEE on 5/24 await results to see what TCTS would recommend. Will plan to treat for 6 wk with dual therapy if kidney function tolerates gentamicin. Will need to stay at long term facility like sn[facility] to finish out care. Not a candidate for getting picc line at home especially with gentamicin- monitoring.  [facility] Cell: [phone] Pager: [phone]  05/02/2018, 11:28 PM

## 2018-05-02 NOTE — Progress Notes (Signed)
Progress Note    Jeffery Bennett  WUJ:[id number] DOB: February 17, 1981  DOA: 04/29/2018 PCP: Patient, No Pcp Per    Brief Narrative:    Medical records reviewed and are as summarized below:  Jeffery Bennett is an 37 y.o. 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  Patient admits to have taken IV drugs crystal meth 2 days ago following which patient started noticing some lesions on the right foot which are painful as well as left shoulder pain.  Found to have endocarditis/bacteremia and work up in progress-- TEE on Friday.    Assessment/Plan:   Active Problems:   Polysubstance abuse (HCC)   Hepatitis C virus infection without hepatic coma   Chest pain   Normochromic normocytic anemia   Pneumonitis   Endocarditis   bacteremia/endocarditis -cultures positive for enterococcus -ID consulted -abx changed to amp/gent -TEE planned for Friday as echo showed: Aortic valve: Poorly visualized AV but there appears to be a 0.63 x 0.61cm mass on the ventricular side of the AV consistent with vegetation with severe AI Regurgitation pressure half-time: 208 ms. -Sed rate is elevated.  -CT chest suspicious for septic emboli -pain in chest and feet improved on 5/22 -repeat blood cultures pending  Normocytic normochromic anemia  -trend CBC  IV drug abuse -social work counseling  Tobacco abuse -encourage cessation  Hep C -defer to ID -HIV negative   Body mass index is 25.78 kg/m.   Family Communication/Anticipated D/C date and plan/Code Status   DVT prophylaxis: Lovenox ordered. Code Status: Full Code.  Family Communication: mother in law at bedside Disposition Plan:    Medical Consultants:    ID  Cards for TEE   Subjective:   Pain in bottom of foot and lungs has stopped  Objective:    Vitals:   05/01/18 2231 05/02/18 0441 05/02/18 0610 05/02/18 1028  BP: 121/60  (!) 99/46 (!)  107/49  Pulse: (!) 106  90 77  Resp: (!) 21  (!) 21 20  Temp: 98.1 F (36.7 C)   97.7 F (36.5 C)  TempSrc: Oral   Oral  SpO2: 92%  97% 97%  Weight:  79.2 kg (174 lb 9.7 oz)    Height:        Intake/Output Summary (Last 24 hours) at 05/02/2018 1506 Last data filed at 05/02/2018 1400 Gross per 24 hour  Intake 1400 ml  Output 2050 ml  Net -650 ml   Filed Weights   04/29/18 1633 04/30/18 2013 05/02/18 0441  Weight: 78.9 kg (174 lb) 79 kg (174 lb 1 oz) 79.2 kg (174 lb 9.7 oz)    Exam: In bed, NAD Right foot with emboli and on sole a raised red area- not firm but will need to be watched for fluctuance/abscess Mood and affect normal  Data Reviewed:   I have personally reviewed following labs and imaging studies:  Labs: Labs show the following:   Basic Metabolic Panel: Recent Labs  Lab 04/29/18 1846 04/30/18 0232 05/01/18 0602 05/02/18 0335  NA 136 135 137 138  K 3.8 3.7 3.7 4.2  CL 100* 101 102 100*  CO2 GLUCOSE 102* 113* 130* 113*  BUN CREATININE 0.95 0.84 0.88 0.88  CALCIUM 9.0 8.4* 8.5* 8.8*   GFR Estimated Creatinine Clearance: 116 mL/min (by C-G formula based on SCr of 0.88 mg/dL). Liver Function Tests: Recent Labs  Lab 04/29/18 1846 04/30/18 0232  AST 13* 12*  ALT 12* 10*  ALKPHOS 73 66  BILITOT 0.4 0.3  PROT 7.4 6.3*  ALBUMIN 2.9* 2.5*   Recent Labs  Lab 04/29/18 1846  LIPASE 24   No results for input(s): AMMONIA in the last 168 hours. Coagulation profile No results for input(s): INR, PROTIME in the last 168 hours.  CBC: Recent Labs  Lab 04/29/18 1846 04/30/18 0232 05/01/18 0602 05/02/18 0335  WBC 11.3* 8.5 8.1 9.0  NEUTROABS 8.2* 5.6  --   --   HGB 11.1* 10.2* 10.5* 10.3*  HCT 35.1* 31.9* 32.8* 32.5*  MCV 82.2 81.8 82.0 82.3  PLT 321 286 287 278   Cardiac Enzymes: No results for input(s): CKTOTAL, CKMB, CKMBINDEX, TROPONINI in the last 168 hours. BNP (last 3 results) No results for input(s): PROBNP in  the last 8760 hours. CBG: No results for input(s): GLUCAP in the last 168 hours. D-Dimer: No results for input(s): DDIMER in the last 72 hours. Hgb A1c: No results for input(s): HGBA1C in the last 72 hours. Lipid Profile: No results for input(s): CHOL, HDL, LDLCALC, TRIG, CHOLHDL, LDLDIRECT in the last 72 hours. Thyroid function studies: No results for input(s): TSH, T4TOTAL, T3FREE, THYROIDAB in the last 72 hours.  Invalid input(s): FREET3 Anemia work up: Recent Labs    04/30/18 0232 04/30/18 0434  VITAMINB12 243  --   FOLATE  --  8.9  FERRITIN 111  --   TIBC 204*  --   IRON 24*  --   RETICCTPCT 1.1  --    Sepsis Labs: Recent Labs  Lab 04/29/18 1846 04/30/18 0232 05/01/18 0602 05/02/18 0335  WBC 11.3* 8.5 8.1 9.0    Microbiology Recent Results (from the past 240 hour(s))  Blood culture (routine x 2)     Status: Abnormal   Collection Time: 04/29/18  6:46 PM  Result Value Ref Range Status   Specimen Description BLOOD BLOOD RIGHT FOREARM  Final   Special Requests   Final    BOTTLES DRAWN AEROBIC AND ANAEROBIC Blood Culture adequate volume   Culture  Setup Time   Final    GRAM POSITIVE COCCI IN CHAINS IN BOTH AEROBIC AND ANAEROBIC BOTTLES CRITICAL RESULT CALLED TO, READ BACK BY AND VERIFIED WITH: MARTIN PHARMD AT 1136 ON 052019 BY SJW PT IN ED ADMITTED TO MC CALLED [facility] AND MC Performed at [facility], [address], [place], [place] [address]    Culture ENTEROCOCCUS FAECALIS (A)  Final   Report Status 05/02/2018 FINAL  Final   Organism ID, Bacteria ENTEROCOCCUS FAECALIS  Final      Susceptibility   Enterococcus faecalis - MIC*    AMPICILLIN <=2 SENSITIVE Sensitive     VANCOMYCIN 1 SENSITIVE Sensitive     GENTAMICIN SYNERGY SENSITIVE Sensitive     * ENTEROCOCCUS FAECALIS  Blood Culture ID Panel (Reflexed)     Status: Abnormal   Collection Time: 04/29/18  6:46 PM  Result Value Ref Range Status   Enterococcus species DETECTED (A) NOT DETECTED Final     Comment: CRITICAL RESULT CALLED TO, READ BACK BY AND VERIFIED WITH: KELLY CONNOR RN AT 1135 ON [id number] BY SJW    Vancomycin resistance NOT DETECTED NOT DETECTED Final   Listeria monocytogenes NOT DETECTED NOT DETECTED Final   Staphylococcus species NOT DETECTED NOT DETECTED Final   Staphylococcus aureus NOT DETECTED NOT DETECTED Final   Streptococcus species NOT DETECTED NOT DETECTED Final   Streptococcus agalactiae NOT  DETECTED NOT DETECTED Final   Streptococcus pneumoniae NOT DETECTED NOT DETECTED Final   Streptococcus pyogenes NOT DETECTED NOT DETECTED Final   Acinetobacter baumannii NOT DETECTED NOT DETECTED Final   Enterobacteriaceae species NOT DETECTED NOT DETECTED Final   Enterobacter cloacae complex NOT DETECTED NOT DETECTED Final   Escherichia coli NOT DETECTED NOT DETECTED Final   Klebsiella oxytoca NOT DETECTED NOT DETECTED Final   Klebsiella pneumoniae NOT DETECTED NOT DETECTED Final   Proteus species NOT DETECTED NOT DETECTED Final   Serratia marcescens NOT DETECTED NOT DETECTED Final   Haemophilus influenzae NOT DETECTED NOT DETECTED Final   Neisseria meningitidis NOT DETECTED NOT DETECTED Final   Pseudomonas aeruginosa NOT DETECTED NOT DETECTED Final   Candida albicans NOT DETECTED NOT DETECTED Final   Candida glabrata NOT DETECTED NOT DETECTED Final   Candida krusei NOT DETECTED NOT DETECTED Final   Candida parapsilosis NOT DETECTED NOT DETECTED Final   Candida tropicalis NOT DETECTED NOT DETECTED Final    Comment: Performed at [facility], [address] [place], [place] [address]  Culture, blood (routine x 2)     Status: None (Preliminary result)   Collection Time: 05/01/18  9:27 PM  Result Value Ref Range Status   Specimen Description BLOOD LEFT ANTECUBITAL  Final   Special Requests   Final    BOTTLES DRAWN AEROBIC AND ANAEROBIC Blood Culture adequate volume   Culture   Final    NO GROWTH < 24 HOURS Performed at [facility], [address],  [place], [place] [address]    Report Status PENDING  Incomplete  Culture, blood (routine x 2)     Status: None (Preliminary result)   Collection Time: 05/01/18  9:32 PM  Result Value Ref Range Status   Specimen Description BLOOD LEFT FOREARM  Final   Special Requests   Final    BOTTLES DRAWN AEROBIC AND ANAEROBIC Blood Culture adequate volume   Culture   Final    NO GROWTH < 24 HOURS Performed at [facility], [address] [place], [place] [address]    Report Status PENDING  Incomplete    Procedures and diagnostic studies:  No results found.  Medications:   . enoxaparin (LOVENOX) injection  40 mg Subcutaneous Q24H  . methadone  100 mg Oral Daily  . nicotine  21 mg Transdermal Daily   Continuous Infusions: . ampicillin (OMNIPEN) IV Stopped (05/02/18 1230)  . gentamicin 100 mg (05/02/18 8295)     LOS: 2 days   Joseph Art  [facility]   *Please refer to [email or web address], password [email or web address] to get updated schedule on who will round on this patient, as hospitalists switch teams weekly. If 7PM-7AM, please contact night-coverage at [email or web address], password [email or web address] for any overnight needs.  05/02/2018, 3:06 PM

## 2018-05-03 LAB — CBC
HCT: 35.2 % — ABNORMAL LOW (ref 39.0–52.0)
Hemoglobin: 10.9 g/dL — ABNORMAL LOW (ref 13.0–17.0)
MCH: 25.6 pg — AB (ref 26.0–34.0)
MCHC: 31 g/dL (ref 30.0–36.0)
MCV: 82.8 fL (ref 78.0–100.0)
PLATELETS: 336 10*3/uL (ref 150–400)
RBC: 4.25 MIL/uL (ref 4.22–5.81)
RDW: 13.8 % (ref 11.5–15.5)
WBC: 7.8 10*3/uL (ref 4.0–10.5)

## 2018-05-03 LAB — BASIC METABOLIC PANEL
Anion gap: 8 (ref 5–15)
BUN: 5 mg/dL — ABNORMAL LOW (ref 6–20)
CALCIUM: 9 mg/dL (ref 8.9–10.3)
CO2: 33 mmol/L — ABNORMAL HIGH (ref 22–32)
CREATININE: 0.91 mg/dL (ref 0.61–1.24)
Chloride: 99 mmol/L — ABNORMAL LOW (ref 101–111)
GFR calc Af Amer: >60 mL/min (ref >60)
GLUCOSE: 96 mg/dL (ref 65–99)
Potassium: 4 mmol/L (ref 3.5–5.1)
Sodium: 140 mmol/L (ref 135–145)

## 2018-05-03 NOTE — Progress Notes (Signed)
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  Trough today 0.5  Plan: Continue Gentamicin 100 mg IV q12h  Height:  (175.3 cm) Weight: 174 lb 9.8 oz (79.2 kg) IBW/kg (Calculated) : 70.7  Temp (24hrs), Avg:97.9 F (36.6 C), Min:97.7 F (36.5 C), Max:98.1 F (36.7 C)  Recent Labs  Lab 04/29/18 1846 04/30/18 0232 05/01/18 0602 05/02/18 0335 05/02/18 1708  WBC 11.3* 8.5 8.1 9.0  --   CREATININE 0.95 0.84 0.88 0.88  --   GENTTROUGH  --   --   --   --  0.5    Estimated Creatinine Clearance: 116 mL/min (by C-G formula based on SCr of 0.88 mg/dL).    No Known Allergies   Eddie Candle 05/03/2018 12:40 AM

## 2018-05-03 NOTE — Progress Notes (Signed)
  Progress Note    Jeffery Bennett  MRN:[id number] DOB: 03/06/1981  DOA: 04/29/2018 PCP: Patient, No Pcp Per    Brief Narrative:    Medical records reviewed and are as summarized below:  Jeffery Bennett is an 36 y.o. 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  Patient admits to have taken IV drugs crystal meth 2 days ago following which patient started noticing some lesions on the right foot which are painful as well as left shoulder pain.  Found to have endocarditis/bacteremia and work up in progress-- TEE on Friday.    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 -Sed rate is elevated.  -CT chest suspicious for septic emboli -pain in chest and feet improved on 5/22 -repeat blood cultures NGTD  Normocytic normochromic anemia  -trend CBC  IV drug abuse  -social work counseling  Tobacco abuse -encourage cessation  Hep C -defer to ID -HIV negative    Family Communication/Anticipated D/C date and plan/Code Status   DVT prophylaxis: Lovenox ordered. Code Status: Full Code.  Family Communication: mother in law at bedside 5/22 Disposition Plan:    Medical Consultants:    ID  Cards for TEE   Subjective:   No pain, no overnight events  Objective:    Vitals:   05/02/18 2021 05/03/18 0500 05/03/18 0611 05/03/18 0947  BP: (!) 101/49  115/64 (!) 109/49  Pulse: 83  75 83  Resp:  18  18 20  Temp: 98.1 F (36.7 C)  98.4 F (36.9 C) 98 F (36.7 C)  TempSrc: Oral   Oral  SpO2: 95%  96% 96%  Weight: 79.2 kg (174 lb 9.8 oz) 79.2 kg (174 lb 9.7 oz)    Height:        Intake/Output Summary (Last 24 hours) at 05/03/2018 1257 Last data filed at 05/03/2018 0900 Gross per 24 hour  Intake 1320 ml  Output 1350 ml  Net -30 ml   Filed Weights   05/02/18 0441 05/02/18 2021 05/03/18 0500  Weight: 79.2 kg (174 lb 9.7 oz) 79.2 kg (174 lb 9.8 oz) 79.2 kg (174 lb 9.7 oz)    Exam: In bed, NAD -emboli on bottom of right foot less swollen No increased work of breathing  Data Reviewed:   I have personally reviewed following labs and imaging studies:  Labs: Labs show the following:   Basic Metabolic Panel: Recent Labs  Lab 04/29/18 1846 04/30/18 0232 05/01/18 0602 05/02/18 0335 05/03/18 0649  NA 136 135 137 138 140  K 3.8 3.7 3.7 4.2 4.0  CL 100* 101 102 100* 99*  CO2 28 26 26 28 33*  GLUCOSE 102* 113* 130* 113* 96  BUN 9 8 7 6 5*  CREATININE 0.95 0.84 0.88 0.88 0.91  CALCIUM 9.0 8.4* 8.5* 8.8* 9.0   GFR Estimated Creatinine Clearance: 112.2 mL/min (by C-G formula based on SCr of 0.91 mg/dL). Liver Function Tests: Recent Labs    Lab 04/29/18 1846 04/30/18 0232  AST 13* 12*  ALT 12* 10*  ALKPHOS 73 66  BILITOT 0.4 0.3  PROT 7.4 6.3*  ALBUMIN 2.9* 2.5*   Recent Labs  Lab 04/29/18 1846  LIPASE 24   No results for input(s): AMMONIA in the last 168 hours. Coagulation profile No results for input(s): INR, PROTIME in the last 168 hours.  CBC: Recent Labs  Lab 04/29/18 1846 04/30/18 0232 05/01/18 0602 05/02/18 0335 05/03/18 0649  WBC 11.3* 8.5 8.1 9.0 7.8  NEUTROABS 8.2* 5.6  --   --   --   HGB 11.1* 10.2* 10.5* 10.3* 10.9*  HCT 35.1* 31.9* 32.8* 32.5* 35.2*  MCV 82.2 81.8 82.0 82.3 82.8  PLT 321 286 287 278 336   Cardiac Enzymes: No results for input(s): CKTOTAL, CKMB, CKMBINDEX, TROPONINI in the last 168 hours. BNP (last 3 results) No  results for input(s): PROBNP in the last 8760 hours. CBG: No results for input(s): GLUCAP in the last 168 hours. D-Dimer: No results for input(s): DDIMER in the last 72 hours. Hgb A1c: No results for input(s): HGBA1C in the last 72 hours. Lipid Profile: No results for input(s): CHOL, HDL, LDLCALC, TRIG, CHOLHDL, LDLDIRECT in the last 72 hours. Thyroid function studies: No results for input(s): TSH, T4TOTAL, T3FREE, THYROIDAB in the last 72 hours.  Invalid input(s): FREET3 Anemia work up: No results for input(s): VITAMINB12, FOLATE, FERRITIN, TIBC, IRON, RETICCTPCT in the last 72 hours. Sepsis Labs: Recent Labs  Lab 04/30/18 0232 05/01/18 0602 05/02/18 0335 05/03/18 0649  WBC 8.5 8.1 9.0 7.8    Microbiology Recent Results (from the past 240 hour(s))  Blood culture (routine x 2)     Status: Abnormal   Collection Time: 04/29/18  6:46 PM  Result Value Ref Range Status   Specimen Description BLOOD BLOOD RIGHT FOREARM  Final   Special Requests   Final    BOTTLES DRAWN AEROBIC AND ANAEROBIC Blood Culture adequate volume   Culture  Setup Time   Final    GRAM POSITIVE COCCI IN CHAINS IN BOTH AEROBIC AND ANAEROBIC BOTTLES CRITICAL RESULT CALLED TO, READ BACK BY AND VERIFIED WITH: MARTIN PHARMD AT 1136 ON 052019 BY SJW PT IN ED ADMITTED TO MC CALLED [facility] AND MC Performed at [facility], [address] [place], [place] [address]    Culture ENTEROCOCCUS FAECALIS (A)  Final   Report Status 05/02/2018 FINAL  Final   Organism ID, Bacteria ENTEROCOCCUS FAECALIS  Final      Susceptibility   Enterococcus faecalis - MIC*    AMPICILLIN <=2 SENSITIVE Sensitive     VANCOMYCIN 1 SENSITIVE Sensitive     GENTAMICIN SYNERGY SENSITIVE Sensitive     * ENTEROCOCCUS FAECALIS  Blood Culture ID Panel (Reflexed)     Status: Abnormal   Collection Time: 04/29/18  6:46 PM  Result Value Ref Range Status   Enterococcus species DETECTED (A) NOT DETECTED Final    Comment: CRITICAL RESULT CALLED  TO, READ BACK BY AND VERIFIED WITH: KELLY CONNOR RN AT 1135 ON 052019 BY SJW    Vancomycin resistance NOT DETECTED NOT DETECTED Final   Listeria monocytogenes NOT DETECTED NOT DETECTED Final   Staphylococcus species NOT DETECTED NOT DETECTED Final   Staphylococcus aureus NOT DETECTED NOT DETECTED Final   Streptococcus species NOT DETECTED NOT DETECTED Final   Streptococcus agalactiae NOT DETECTED NOT DETECTED Final   Streptococcus pneumoniae NOT DETECTED NOT DETECTED Final   Streptococcus pyogenes NOT DETECTED NOT DETECTED Final     Acinetobacter baumannii NOT DETECTED NOT DETECTED Final   Enterobacteriaceae species NOT DETECTED NOT DETECTED Final   Enterobacter cloacae complex NOT DETECTED NOT DETECTED Final   Escherichia coli NOT DETECTED NOT DETECTED Final   Klebsiella oxytoca NOT DETECTED NOT DETECTED Final   Klebsiella pneumoniae NOT DETECTED NOT DETECTED Final   Proteus species NOT DETECTED NOT DETECTED Final   Serratia marcescens NOT DETECTED NOT DETECTED Final   Haemophilus influenzae NOT DETECTED NOT DETECTED Final   Neisseria meningitidis NOT DETECTED NOT DETECTED Final   Pseudomonas aeruginosa NOT DETECTED NOT DETECTED Final   Candida albicans NOT DETECTED NOT DETECTED Final   Candida glabrata NOT DETECTED NOT DETECTED Final   Candida krusei NOT DETECTED NOT DETECTED Final   Candida parapsilosis NOT DETECTED NOT DETECTED Final   Candida tropicalis NOT DETECTED NOT DETECTED Final    Comment: Performed at [facility], [address] [place], [place] [address]  Culture, blood (routine x 2)     Status: None (Preliminary result)   Collection Time: 05/01/18  9:27 PM  Result Value Ref Range Status   Specimen Description BLOOD LEFT ANTECUBITAL  Final   Special Requests   Final    BOTTLES DRAWN AEROBIC AND ANAEROBIC Blood Culture adequate volume   Culture   Final    NO GROWTH 2 DAYS Performed at [facility], [address] [place], [place] [address]    Report  Status PENDING  Incomplete  Culture, blood (routine x 2)     Status: None (Preliminary result)   Collection Time: 05/01/18  9:32 PM  Result Value Ref Range Status   Specimen Description BLOOD LEFT FOREARM  Final   Special Requests   Final    BOTTLES DRAWN AEROBIC AND ANAEROBIC Blood Culture adequate volume   Culture   Final    NO GROWTH 2 DAYS Performed at [facility], [address] [place], [place] [address]    Report Status PENDING  Incomplete    Procedures and diagnostic studies:  No results found.  Medications:   . enoxaparin (LOVENOX) injection  40 mg Subcutaneous Q24H  . methadone  100 mg Oral Daily  . nicotine  21 mg Transdermal Daily   Continuous Infusions: . ampicillin (OMNIPEN) IV Stopped (05/03/18 1048)  . gentamicin 100 mg (05/03/18 0626)     LOS: 3 days   Treylon Henard U Hanin Decook  [facility]   *Please refer to [email or web address], password [email or web address] to get updated schedule on who will round on this patient, as hospitalists switch teams weekly. If 7PM-7AM, please contact night-coverage at [email or web address], password [email or web address] for any overnight needs.  05/03/2018, 12:57 PM          

## 2018-05-03 NOTE — Anesthesia Preprocedure Evaluation (Addendum)
Anesthesia Evaluation  Patient identified by MRN, date of birth, ID band Patient awake  General Assessment Comment:Bacteremia   Reviewed: Allergy & Precautions, NPO status , Patient's Chart, lab work & pertinent test results  Airway Mallampati: II  TM Distance: >3 FB Neck ROM: Full    Dental  (+) Dental Advisory Given, Poor Dentition, Chipped   Pulmonary Current Smoker,    Pulmonary exam normal breath sounds clear to auscultation       Cardiovascular Normal cardiovascular exam Rhythm:Regular Rate:Normal  5/20 TTE: Study Conclusions  - Left ventricle: The cavity size was normal. Systolic function was normal. The estimated ejection fraction was in the range of 55% to 60%. Wall motion was normal; there were no regional wall motion abnormalities. Left ventricular diastolic function parameters were normal. 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 - Mitral valve: There was trivial regurgitation. - Pulmonary arteries: Systolic pressure could not be accurately estimated.   Neuro/Psych negative neurological ROS  negative psych ROS   GI/Hepatic negative GI ROS, (+)     substance abuse  , Hepatitis -, C  Endo/Other  negative endocrine ROS  Renal/GU negative Renal ROS     Musculoskeletal negative musculoskeletal ROS (+)   Abdominal   Peds  Hematology  (+) Blood dyscrasia, anemia ,   Anesthesia Other Findings Day of surgery medications reviewed with the patient.  Reproductive/Obstetrics                           Anesthesia Physical Anesthesia Plan  ASA: III  Anesthesia Plan: MAC   Post-op Pain Management:    Induction: Intravenous  PONV Risk Score and Plan: 0 and Propofol infusion  Airway Management Planned: Nasal Cannula  Additional  Equipment:   Intra-op Plan:   Post-operative Plan:   Informed Consent: I have reviewed the patients History and Physical, chart, labs and discussed the procedure including the risks, benefits and alternatives for the proposed anesthesia with the patient or authorized representative who has indicated his/her understanding and acceptance.   Dental advisory given  Plan Discussed with: CRNA  Anesthesia Plan Comments:        Anesthesia Quick Evaluation

## 2018-05-03 NOTE — H&P (View-Only) (Signed)
Progress Note    Jeffery Bennett  WUJ:[id number] DOB: [birth year]  DOA: 04/29/2018 PCP: Patient, No Pcp Per    Brief Narrative:    Medical records reviewed and are as summarized below:  Jeffery Bennett is an 37 y.o. 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  Patient admits to have taken IV drugs crystal meth 2 days ago following which patient started noticing some lesions on the right foot which are painful as well as left shoulder pain.  Found to have endocarditis/bacteremia and work up in progress-- TEE on Friday.    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 -Sed rate is elevated.  -CT chest suspicious for septic emboli -pain in chest and feet improved on 5/22 -repeat blood cultures NGTD  Normocytic normochromic anemia  -trend CBC  IV drug abuse  -social work counseling  Tobacco abuse -encourage cessation  Hep C -defer to ID -HIV negative    Family Communication/Anticipated D/C date and plan/Code Status   DVT prophylaxis: Lovenox ordered. Code Status: Full Code.  Family Communication: mother in law at bedside 5/22 Disposition Plan:    Medical Consultants:    ID  Cards for TEE   Subjective:   No pain, no overnight events  Objective:    Vitals:   05/02/18 2021 05/03/18 0500 05/03/18 0611 05/03/18 0947  BP: (!) 101/49  115/64 (!) 109/49  Pulse: 83  75 83  Resp:  Temp: 98.1 F (36.7 C)  98.4 F (36.9 C) 98 F (36.7 C)  TempSrc: Oral   Oral  SpO2: 95%  96% 96%  Weight: 79.2 kg (174 lb 9.8 oz) 79.2 kg (174 lb 9.7 oz)    Height:        Intake/Output Summary (Last 24 hours) at 05/03/2018 1257 Last data filed at 05/03/2018 0900 Gross per 24 hour  Intake 1320 ml  Output 1350 ml  Net -30 ml   Filed Weights   05/02/18 0441 05/02/18 2021 05/03/18 0500  Weight: 79.2 kg (174 lb 9.7 oz) 79.2 kg (174 lb 9.8 oz) 79.2 kg (174 lb 9.7 oz)    Exam: In bed, NAD -emboli on bottom of right foot less swollen No increased work of breathing  Data Reviewed:   I have personally reviewed following labs and imaging studies:  Labs: Labs show the following:   Basic Metabolic Panel: Recent Labs  Lab 04/29/18 1846 04/30/18 0232 05/01/18 0602 05/02/18 0335 05/03/18 0649  NA 136 135 137 138 140  K 3.8 3.7 3.7 4.2 4.0  CL 100* 101 102 100* 99*  CO2 33*  GLUCOSE 102* 113* 130* 113* 96  BUN 5*  CREATININE 0.95 0.84 0.88 0.88 0.91  CALCIUM 9.0 8.4* 8.5* 8.8* 9.0   GFR Estimated Creatinine Clearance: 112.2 mL/min (by C-G formula based on SCr of 0.91 mg/dL). Liver Function Tests: Recent Labs  Lab 04/29/18 1846 04/30/18 0232  AST 13* 12*  ALT 12* 10*  ALKPHOS 73 66  BILITOT 0.4 0.3  PROT 7.4 6.3*  ALBUMIN 2.9* 2.5*   Recent Labs  Lab 04/29/18 1846  LIPASE 24   No results for input(s): AMMONIA in the last 168 hours. Coagulation profile No results for input(s): INR, PROTIME in the last 168 hours.  CBC: Recent Labs  Lab 04/29/18 1846 04/30/18 0232 05/01/18 0602 05/02/18 0335 05/03/18 0649  WBC 11.3* 8.5 8.1 9.0 7.8  NEUTROABS 8.2* 5.6  --   --   --   HGB 11.1* 10.2* 10.5* 10.3* 10.9*  HCT 35.1* 31.9* 32.8* 32.5* 35.2*  MCV 82.2 81.8 82.0 82.3 82.8  PLT 321 286 287 278 336   Cardiac Enzymes: No results for input(s): CKTOTAL, CKMB, CKMBINDEX, TROPONINI in the last 168 hours. BNP (last 3 results) No  results for input(s): PROBNP in the last 8760 hours. CBG: No results for input(s): GLUCAP in the last 168 hours. D-Dimer: No results for input(s): DDIMER in the last 72 hours. Hgb A1c: No results for input(s): HGBA1C in the last 72 hours. Lipid Profile: No results for input(s): CHOL, HDL, LDLCALC, TRIG, CHOLHDL, LDLDIRECT in the last 72 hours. Thyroid function studies: No results for input(s): TSH, T4TOTAL, T3FREE, THYROIDAB in the last 72 hours.  Invalid input(s): FREET3 Anemia work up: No results for input(s): VITAMINB12, FOLATE, FERRITIN, TIBC, IRON, RETICCTPCT in the last 72 hours. Sepsis Labs: Recent Labs  Lab 04/30/18 0232 05/01/18 0602 05/02/18 0335 05/03/18 0649  WBC 8.5 8.1 9.0 7.8    Microbiology Recent Results (from the past 240 hour(s))  Blood culture (routine x 2)     Status: Abnormal   Collection Time: 04/29/18  6:46 PM  Result Value Ref Range Status   Specimen Description BLOOD BLOOD RIGHT FOREARM  Final   Special Requests   Final    BOTTLES DRAWN AEROBIC AND ANAEROBIC Blood Culture adequate volume   Culture  Setup Time   Final    GRAM POSITIVE COCCI IN CHAINS IN BOTH AEROBIC AND ANAEROBIC BOTTLES CRITICAL RESULT CALLED TO, READ BACK BY AND VERIFIED WITH: MARTIN PHARMD AT 1136 ON 052019 BY SJW PT IN ED ADMITTED TO MC CALLED [facility] AND MC Performed at [facility], [address], [place], [place] [address]    Culture ENTEROCOCCUS FAECALIS (A)  Final   Report Status 05/02/2018 FINAL  Final   Organism ID, Bacteria ENTEROCOCCUS FAECALIS  Final      Susceptibility   Enterococcus faecalis - MIC*    AMPICILLIN <=2 SENSITIVE Sensitive     VANCOMYCIN 1 SENSITIVE Sensitive     GENTAMICIN SYNERGY SENSITIVE Sensitive     * ENTEROCOCCUS FAECALIS  Blood Culture ID Panel (Reflexed)     Status: Abnormal   Collection Time: 04/29/18  6:46 PM  Result Value Ref Range Status   Enterococcus species DETECTED (A) NOT DETECTED Final    Comment: CRITICAL RESULT CALLED  TO, READ BACK BY AND VERIFIED WITH: KELLY CONNOR RN AT 1135 ON [id number] BY SJW    Vancomycin resistance NOT DETECTED NOT DETECTED Final   Listeria monocytogenes NOT DETECTED NOT DETECTED Final   Staphylococcus species NOT DETECTED NOT DETECTED Final   Staphylococcus aureus NOT DETECTED NOT DETECTED Final   Streptococcus species NOT DETECTED NOT DETECTED Final   Streptococcus agalactiae NOT DETECTED NOT DETECTED Final   Streptococcus pneumoniae NOT DETECTED NOT DETECTED Final   Streptococcus pyogenes NOT DETECTED NOT DETECTED Final  Acinetobacter baumannii NOT DETECTED NOT DETECTED Final   Enterobacteriaceae species NOT DETECTED NOT DETECTED Final   Enterobacter cloacae complex NOT DETECTED NOT DETECTED Final   Escherichia coli NOT DETECTED NOT DETECTED Final   Klebsiella oxytoca NOT DETECTED NOT DETECTED Final   Klebsiella pneumoniae NOT DETECTED NOT DETECTED Final   Proteus species NOT DETECTED NOT DETECTED Final   Serratia marcescens NOT DETECTED NOT DETECTED Final   Haemophilus influenzae NOT DETECTED NOT DETECTED Final   Neisseria meningitidis NOT DETECTED NOT DETECTED Final   Pseudomonas aeruginosa NOT DETECTED NOT DETECTED Final   Candida albicans NOT DETECTED NOT DETECTED Final   Candida glabrata NOT DETECTED NOT DETECTED Final   Candida krusei NOT DETECTED NOT DETECTED Final   Candida parapsilosis NOT DETECTED NOT DETECTED Final   Candida tropicalis NOT DETECTED NOT DETECTED Final    Comment: Performed at [facility], [address], [place], [place] [address]  Culture, blood (routine x 2)     Status: None (Preliminary result)   Collection Time: 05/01/18  9:27 PM  Result Value Ref Range Status   Specimen Description BLOOD LEFT ANTECUBITAL  Final   Special Requests   Final    BOTTLES DRAWN AEROBIC AND ANAEROBIC Blood Culture adequate volume   Culture   Final    NO GROWTH 2 DAYS Performed at [facility], [address] [place], [place] [address]    Report  Status PENDING  Incomplete  Culture, blood (routine x 2)     Status: None (Preliminary result)   Collection Time: 05/01/18  9:32 PM  Result Value Ref Range Status   Specimen Description BLOOD LEFT FOREARM  Final   Special Requests   Final    BOTTLES DRAWN AEROBIC AND ANAEROBIC Blood Culture adequate volume   Culture   Final    NO GROWTH 2 DAYS Performed at [facility], [address], [place], [place] [address]    Report Status PENDING  Incomplete    Procedures and diagnostic studies:  No results found.  Medications:   . enoxaparin (LOVENOX) injection  40 mg Subcutaneous Q24H  . methadone  100 mg Oral Daily  . nicotine  21 mg Transdermal Daily   Continuous Infusions: . ampicillin (OMNIPEN) IV Stopped (05/03/18 1048)  . gentamicin 100 mg (05/03/18 0626)     LOS: 3 days   Joseph Art  [facility]   *Please refer to [email or web address], password [email or web address] to get updated schedule on who will round on this patient, as hospitalists switch teams weekly. If 7PM-7AM, please contact night-coverage at [email or web address], password [email or web address] for any overnight needs.  05/03/2018, 12:57 PM

## 2018-05-03 NOTE — Progress Notes (Signed)
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   Gentamicin trough is appropriate (0.5), renal function is stable.  Plan: Ampicillin 2g IV q4h Gentamicin  IV q12h for synergy dosing Plan for at least weekly GT to ensure staying <1 for syngery- sooner if clinical picture warrants  TEE scheduled for Friday   Height:  (175.3 cm) Weight: 174 lb 9.7 oz (79.2 kg) IBW/kg (Calculated) : 70.7  Temp (24hrs), Avg:98.1 F (36.7 C), Min:98 F (36.7 C), Max:98.4 F (36.9 C)  Recent Labs  Lab 04/29/18 1846 04/30/18 0232 05/01/18 0602 05/02/18 0335 05/02/18 1708 05/03/18 0649  WBC 11.3* 8.5 8.1 9.0  --  7.8  CREATININE 0.95 0.84 0.88 0.88  --  0.91  GENTTROUGH  --   --   --   --  0.5  --     Estimated Creatinine Clearance: 112.2 mL/min (by C-G formula based on SCr of 0.91 mg/dL).    No Known Allergies   Vancomycin 5/20 x1 Ampicillin 5/20>> Gentamicin 5/20>> *5/22 GT 0.5  5/19 BCx: 2/2 enterococcus > pan sens 5/19 BCID: enterococcus, no resistance  Jabrea Kallstrom D. [place] Dodgen, PharmD, BCPS Clinical Pharmacist [phone] 05/03/2018 11:09 AM

## 2018-05-04 ENCOUNTER — Encounter (HOSPITAL_COMMUNITY): Payer: Self-pay | Admitting: Certified Registered Nurse Anesthetist

## 2018-05-04 ENCOUNTER — Encounter (HOSPITAL_COMMUNITY)
Admission: EM | Discharge status: Against Medical Advice | Payer: Self-pay | Source: Home / Self Care | Attending: Surgery

## 2018-05-04 ENCOUNTER — Inpatient Hospital Stay (HOSPITAL_COMMUNITY): Payer: Self-pay

## 2018-05-04 ENCOUNTER — Inpatient Hospital Stay (HOSPITAL_COMMUNITY): Payer: Self-pay | Admitting: Anesthesiology

## 2018-05-04 DIAGNOSIS — I352 Nonrheumatic aortic (valve) stenosis with insufficiency: Secondary | ICD-10-CM

## 2018-05-04 DIAGNOSIS — I351 Nonrheumatic aortic (valve) insufficiency: Secondary | ICD-10-CM

## 2018-05-04 DIAGNOSIS — I339 Acute and subacute endocarditis, unspecified: Secondary | ICD-10-CM

## 2018-05-04 HISTORY — PX: TEE WITHOUT CARDIOVERSION: SHX5443

## 2018-05-04 LAB — BASIC METABOLIC PANEL
ANION GAP: 8 (ref 5–15)
BUN: 8 mg/dL (ref 6–20)
CHLORIDE: 97 mmol/L — AB (ref 101–111)
CO2: 33 mmol/L — AB (ref 22–32)
Calcium: 9.1 mg/dL (ref 8.9–10.3)
Creatinine, Ser: 1.09 mg/dL (ref 0.61–1.24)
GFR calc Af Amer: >60 mL/min (ref >60)
GFR calc non Af Amer: >60 mL/min (ref >60)
Glucose, Bld: 93 mg/dL (ref 65–99)
Potassium: 4 mmol/L (ref 3.5–5.1)
Sodium: 138 mmol/L (ref 135–145)

## 2018-05-04 LAB — CBC
HEMATOCRIT: 35.7 % — AB (ref 39.0–52.0)
HEMOGLOBIN: 11 g/dL — AB (ref 13.0–17.0)
MCH: 25.5 pg — ABNORMAL LOW (ref 26.0–34.0)
MCHC: 30.8 g/dL (ref 30.0–36.0)
MCV: 82.8 fL (ref 78.0–100.0)
Platelets: 358 10*3/uL (ref 150–400)
RBC: 4.31 MIL/uL (ref 4.22–5.81)
RDW: 13.7 % (ref 11.5–15.5)
WBC: 8.8 10*3/uL (ref 4.0–10.5)

## 2018-05-04 LAB — HCV RNA QUANT
HCV QUANT LOG: 6.894 {Log_IU}/mL (ref >1.70)
HCV Quantitative: 7840000 IU/mL (ref >50)

## 2018-05-04 SURGERY — ECHOCARDIOGRAM, TRANSESOPHAGEAL
Anesthesia: Monitor Anesthesia Care

## 2018-05-04 MED ORDER — SODIUM CHLORIDE 0.9 % IV SOLN
INTRAVENOUS | Status: DC | PRN
  Administered 2018-05-04: 08:00:00 via INTRAVENOUS

## 2018-05-04 MED ORDER — BUTAMBEN-TETRACAINE-BENZOCAINE 2-2-14 % EX AERO
INHALATION_SPRAY | CUTANEOUS | Status: DC | PRN
  Administered 2018-05-04: 2 via TOPICAL

## 2018-05-04 MED ORDER — PROPOFOL 10 MG/ML IV BOLUS
INTRAVENOUS | Status: DC | PRN
  Administered 2018-05-04 (×5): 10 mg via INTRAVENOUS

## 2018-05-04 MED ORDER — PROMETHAZINE HCL 25 MG/ML IJ SOLN: 6.2500 mg | INTRAMUSCULAR | Status: DC | PRN

## 2018-05-04 MED ORDER — PROPOFOL 500 MG/50ML IV EMUL
INTRAVENOUS | Status: DC | PRN
  Administered 2018-05-04: 100 ug/kg/min via INTRAVENOUS
  Administered 2018-05-04: 08:00:00 via INTRAVENOUS

## 2018-05-04 MED ORDER — FENTANYL CITRATE (PF) 100 MCG/2ML IJ SOLN: 25.0000 ug | INTRAMUSCULAR | Status: DC | PRN

## 2018-05-04 MED ORDER — SODIUM CHLORIDE 0.9 % IV SOLN: INTRAVENOUS | Status: DC

## 2018-05-04 NOTE — Anesthesia Postprocedure Evaluation (Signed)
Anesthesia Post Note  Patient: XAVIER MUNGER  Procedure(s) Performed: TRANSESOPHAGEAL ECHOCARDIOGRAM (TEE) (N/A )     Patient location during evaluation: Endoscopy Anesthesia Type: MAC Level of consciousness: awake and alert Pain management: pain level controlled Vital Signs Assessment: post-procedure vital signs reviewed and stable Respiratory status: spontaneous breathing, nonlabored ventilation, respiratory function stable and patient connected to nasal cannula oxygen Cardiovascular status: stable and blood pressure returned to baseline Postop Assessment: no apparent nausea or vomiting Anesthetic complications: no    Last Vitals:  Vitals:   05/04/18 0850 05/04/18 1019  BP: (!) 107/52 (!) 113/44  Pulse: 73 91  Resp: 14 18  Temp:  36.7 C  SpO2: 98% 97%    Last Pain:  Vitals:   05/04/18 1019  TempSrc: Oral  PainSc:                  Catalina Gravel

## 2018-05-04 NOTE — Transfer of Care (Signed)
Immediate Anesthesia Transfer of Care Note  Patient: Jeffery Bennett  Procedure(s) Performed: TRANSESOPHAGEAL ECHOCARDIOGRAM (TEE) (N/A )  Patient Location: Endoscopy Unit  Anesthesia Type:MAC  Level of Consciousness: awake, alert  and oriented  Airway & Oxygen Therapy: Patient Spontanous Breathing and Patient connected to nasal cannula oxygen  Post-op Assessment: Report given to RN, Post -op Vital signs reviewed and stable and Patient moving all extremities X 4  Post vital signs: Reviewed and stable  Last Vitals:  Vitals Value Taken Time  BP    Temp    Pulse    Resp    SpO2      Last Pain:  Vitals:   05/04/18 0715  TempSrc: Oral  PainSc: 0-No pain         Complications: No apparent anesthesia complications

## 2018-05-04 NOTE — Anesthesia Procedure Notes (Signed)
Procedure Name: MAC Date/Time: 05/04/2018 8:02 AM Performed by: Harden Mo, CRNA Pre-anesthesia Checklist: Patient identified, Emergency Drugs available, Suction available and Patient being monitored Patient Re-evaluated:Patient Re-evaluated prior to induction Oxygen Delivery Method: Nasal cannula Preoxygenation: Pre-oxygenation with 100% oxygen Induction Type: IV induction Placement Confirmation: positive ETCO2 and breath sounds checked- equal and bilateral Dental Injury: Teeth and Oropharynx as per pre-operative assessment

## 2018-05-04 NOTE — Interval H&P Note (Signed)
History and Physical Interval Note:  05/04/2018 7:49 AM  Jeffery Bennett  has presented today for surgery, with the diagnosis of BACTEREMIA  The various methods of treatment have been discussed with the patient and family. After consideration of risks, benefits and other options for treatment, the patient has consented to  Procedure(s): TRANSESOPHAGEAL ECHOCARDIOGRAM (TEE) (N/A) as a surgical intervention .  The patient's history has been reviewed, patient examined, no change in status, stable for surgery.  I have reviewed the patient's chart and labs.  Questions were answered to the patient's satisfaction.     Charlton Haws

## 2018-05-04 NOTE — Progress Notes (Signed)
  Echocardiogram Echocardiogram Transesophageal has been performed.  Delcie Roch 05/04/2018, 8:55 AM
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
## 2018-05-04 NOTE — Progress Notes (Signed)
    [facility]    Date of Admission:  04/29/2018   Total days of antibiotics 6        Day 5 amp/sub   ID: Jeffery Bennett is a 37 y.o. male with enterococcal AV endocarditis Active Problems:   Polysubstance abuse (HCC)   Hepatitis C virus infection without hepatic coma   Chest pain   Normochromic normocytic anemia   Pneumonitis   Endocarditis    Subjective: Afebrile, underwent TEE that showed destruction of AV with severe AR. He was also evaluated by dr Jeffery Bennett who is considering valve replacement after 2 weeks of IV abtx and dental evaluation.  Medications:  . enoxaparin (LOVENOX) injection  40 mg Subcutaneous Q24H  . methadone  100 mg Oral Daily  . nicotine  21 mg Transdermal Daily    Objective: Vital signs in last 24 hours: Temp:  [97.8 F (36.6 C)-98.2 F (36.8 C)] 97.8 F (36.6 C) (05/24 1809) Pulse Rate:  [73-91] 77 (05/24 1809) Resp:  [13-18] 18 (05/24 1809) BP: (94-113)/(41-53) 107/53 (05/24 1809) SpO2:  [96 %-98 %] 97 % (05/24 1809) Weight:  [174 lb (78.9 kg)-174 lb 9.9 oz (79.2 kg)] 174 lb (78.9 kg) (05/24 0715)  Physical Exam  Constitutional: He is oriented to person, place, and time. He appears well-developed and well-nourished. No distress.  HENT:  Mouth/Throat: Oropharynx is clear and moist. No oropharyngeal exudate.  Cardiovascular: Normal rate, regular rhythm and normal heart sounds. Exam reveals no gallop and no friction rub.  No murmur heard.  Pulmonary/Chest: Effort normal and breath sounds normal. No respiratory distress. He has no wheezes.  Abdominal: Soft. Bowel sounds are normal. 3/6 diastolic murmur He has no cervical adenopathy.  Neurological: He is alert and oriented to person, place, and time.  Skin: Skin is warm and dry. No rash noted. No erythema. Right foot osler's node Psychiatric: He has a normal mood and affect. His behavior is normal.     Lab Results Recent Labs    05/03/18 0649 05/04/18 0659  WBC  7.8 8.8  HGB 10.9* 11.0*  HCT 35.2* 35.7*  NA 140 138  K 4.0 4.0  CL 99* 97*  CO2 33* 33*  BUN 5* 8  CREATININE 0.91 1.09    Microbiology: 5/19 enterococcal amp S -blood cx 5/21 blood cx ngtd Studies/Results: No results found.   Assessment/Plan: Enterococcal native AV endocarditis = continue on ampicillin and gentamicin. Appears to be tolerating abtx and renal function thus far intact. Plan to do 6 wk in total if can tolerate without aki. Appreciate Dr Sharee Pimple assessment and plan for patient. Plan for dental eval and possible dental extraction next week.   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  [facility] Infect[facility] Diseases Cell: [phone] Pager: [phone]  05/04/2018, 6:33 PM

## 2018-05-04 NOTE — CV Procedure (Signed)
TEE: Propofol Anesthesia  Tri leaflet AV with endocarditis. Non and left cusps are perforated with vegetation on left cusp And prolapsing segments Severe AR No annular abscess appreciated  MV,TV,PV normal  Normal aortic root  EF 65%  No ASD/PFO  No LAA Thrombus  No effusion  3D imaging of the AV, MV and atrial septum performed  Charlton Haws

## 2018-05-04 NOTE — Clinical Social Work Note (Signed)
CSW received consult from MD regarding patient needing SNF placement for 6 weeks of IV antibiotics. Reviewed chart and patient currently on ampicillin (administered every 4 hours IV) and gentamicin (administered every 12 hours IV). Patient does not have any insurance. CSW consulted with SW director [facility] regarding patient and the ampicillin administration of every 4 hours would need to be every 12 hours for a nursing facility to consider taking patient. MD contacted, informed and will check into ampicillin being administered every 12 hours. Once this is resolved, CSW will initiate facility search.  Genelle Bal, MSW, LCSW Licensed Clinical Social Worker Clinical Social Work Department [employer] [phone]

## 2018-05-04 NOTE — Progress Notes (Deleted)
  Echocardiogram 2D Echocardiogram has been performed.  Delcie Roch 05/04/2018, 8:53 AM

## 2018-05-04 NOTE — Progress Notes (Signed)
PROGRESS NOTE        PATIENT DETAILS Name: Jeffery Bennett Age: 37 y.o. Sex: male Date of Birth: Mar 17, 1981 Admit Date: 04/29/2018 Admitting Physician Jeffery Clos, MD WJX:[id number], No Pcp Per  Brief Narrative: Patient is a 36 y.o. 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  Remains on empiric ampicillin and gentamicin-see below for further details  Subjective: No nausea or vomiting or diarrhea.  Upset that he may have to remain in the hospital for 6 weeks for IV antibiotics.  Assessment/Plan: Enterococcal native valve endocarditis septic emboli to lung and Ostler nodes to bilateral feet: ID following-with plans for 6 weeks of ampicillin and gentamicin.  Cardiothoracic surgery consulted as suggested by ID-as TEE showed perforated aortic cusps with vegetation and severe AR.  He has no clinical features suggestive of CHF at this time.  Recent EKG normal PR interval.  He clearly is not a candidate for outpatient IV antibiotics treatment through a PICC line.  Have asked social work to see if he can be placed to SNF for IV antibiotics.  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  It seems that he has been using drugs in spite of being on methadone.  Counseled-claims he will not use drugs any longer.  Remains on methadone-follow EKG periodically to check QTC.  Chronic hepatitis C: Outpatient follow-up with infectious disease  Mild normocytic anemia: Stable for follow-up-since very mild-doubt any further work-up required at this time  DVT Prophylaxis: Prophylactic Lovenox   Code Status: Full code   Family Communication: Mother at bedside  Disposition Plan: Remain inpatient-will  likely remain inpatient for several weeks to complete IV antibiotics  Antimicrobial agents: Anti-infectives (From admission, onward)   Start     Dose/Rate Route Frequency Ordered Stop   04/30/18 1800  gentamicin (GARAMYCIN) IVPB 100 mg     100 mg 200 mL/hr over 30 Minutes Intravenous Every 12 hours 04/30/18 1204     04/30/18 1200  vancomycin (VANCOCIN) IVPB 1000 mg/200 mL premix  Status:  Discontinued     1,000 mg 200 mL/hr over 60 Minutes Intravenous Every 8 hours 04/30/18 0237 04/30/18 1154   04/30/18 1200  ampicillin (OMNIPEN) 2 g in sodium chloride 0.9 % 100 mL IVPB     2 g 300 mL/hr over 20 Minutes Intravenous Every 4 hours 04/30/18 1154     04/30/18 0245  vancomycin (VANCOCIN) 1,500 mg in sodium chloride 0.9 % 500 mL IVPB     1,500 mg 250 mL/hr over 120 Minutes Intravenous  Once 04/30/18 0237 04/30/18 0555      Procedures: 5/24>>TEE  CONSULTS:  ID and TCTS  Time spent: 25 minutes-Greater than 50% of this time was spent in counseling, explanation of diagnosis, planning of further management, and coordination of care.  MEDICATIONS: Scheduled Meds: . enoxaparin (LOVENOX) injection  40 mg Subcutaneous Q24H  . methadone  100 mg Oral Daily  . nicotine  21 mg Transdermal Daily   Continuous Infusions: . ampicillin (OMNIPEN) IV 2 g (05/04/18 1203)  . gentamicin 100 mg (05/04/18 0622)   PRN Meds:.acetaminophen **OR** acetaminophen, ondansetron **OR** ondansetron (  ZOFRAN) IV   PHYSICAL EXAM: Vital signs: Vitals:   05/04/18 0715 05/04/18 0840 05/04/18 0850 05/04/18 1019  BP: (!) 104/51 (!) 94/41 (!) 107/52 (!) 113/44  Pulse: 80  73 91  Resp: Temp: 98 F (36.7 C) 98.1 F (36.7 C)  98.1 F (36.7 C)  TempSrc: Oral Oral  Oral  SpO2: 96% 96% 98% 97%  Weight: 78.9 kg (174 lb)     Height:  (1.753 m)      Filed Weights   05/03/18 2022 05/04/18 0500 05/04/18 0715  Weight: 79.2 kg (174 lb 9.9 oz) 79.2 kg (174 lb 9.7 oz) 78.9 kg (174 lb)   Body mass  index is 25.7 kg/m.   General appearance :Awake, alert, not in any distress. HEENT: Atraumatic and Normocephalic Neck: supple Resp:Good air entry bilaterally, no added sounds  CVS: S1 S2 regular GI: Bowel sounds present, Non tender and not distended with no gaurding, rigidity or rebound.No organomegaly Extremities: B/L Lower Ext shows no edema, both legs are warm to touch-Fading mostly note seen in bilateral plantar aspect of the feet Neurology:  speech clear,Non focal, sensation is grossly intact. Psychiatric: Normal judgment and insight. Alert and oriented x 3. Normal mood. Musculoskeletal:No digital cyanosis Skin:No Rash, warm and dry Wounds:N/A  I have personally reviewed following labs and imaging studies  LABORATORY DATA: CBC: Recent Labs  Lab 04/29/18 1846 04/30/18 0232 05/01/18 0602 05/02/18 0335 05/03/18 0649 05/04/18 0659  WBC 11.3* 8.5 8.1 9.0 7.8 8.8  NEUTROABS 8.2* 5.6  --   --   --   --   HGB 11.1* 10.2* 10.5* 10.3* 10.9* 11.0*  HCT 35.1* 31.9* 32.8* 32.5* 35.2* 35.7*  MCV 82.2 81.8 82.0 82.3 82.8 82.8  PLT 321 286 287 278 336 358    Basic Metabolic Panel: Recent Labs  Lab 04/30/18 0232 05/01/18 0602 05/02/18 0335 05/03/18 0649 05/04/18 0659  NA 135 137 138 140 138  K 3.7 3.7 4.2 4.0 4.0  CL 101 102 100* 99* 97*  CO2 33* 33*  GLUCOSE 113* 130* 113* 96 93  BUN 5* 8  CREATININE 0.84 0.88 0.88 0.91 1.09  CALCIUM 8.4* 8.5* 8.8* 9.0 9.1    GFR: Estimated Creatinine Clearance: 93.7 mL/min (by C-G formula based on SCr of 1.09 mg/dL).  Liver Function Tests: Recent Labs  Lab 04/29/18 1846 04/30/18 0232  AST 13* 12*  ALT 12* 10*  ALKPHOS 73 66  BILITOT 0.4 0.3  PROT 7.4 6.3*  ALBUMIN 2.9* 2.5*   Recent Labs  Lab 04/29/18 1846  LIPASE 24   No results for input(s): AMMONIA in the last 168 hours.  Coagulation Profile: No results for input(s): INR, PROTIME in the last 168 hours.  Cardiac Enzymes: No results for input(s):  CKTOTAL, CKMB, CKMBINDEX, TROPONINI in the last 168 hours.  BNP (last 3 results) No results for input(s): PROBNP in the last 8760 hours.  HbA1C: No results for input(s): HGBA1C in the last 72 hours.  CBG: No results for input(s): GLUCAP in the last 168 hours.  Lipid Profile: No results for input(s): CHOL, HDL, LDLCALC, TRIG, CHOLHDL, LDLDIRECT in the last 72 hours.  Thyroid Function Tests: No results for input(s): TSH, T4TOTAL, FREET4, T3FREE, THYROIDAB in the last 72 hours.  Anemia Panel: No results for input(s): VITAMINB12, FOLATE, FERRITIN, TIBC, IRON, RETICCTPCT in the last 72 hours.  Urine analysis:    Component Value Date/Time   COLORURINE YELLOW 04/30/2018 0024  APPEARANCEUR CLEAR 04/30/2018 0024   LABSPEC >1.046 (H) 04/30/2018 0024   PHURINE 5.0 04/30/2018 0024   GLUCOSEU NEGATIVE 04/30/2018 0024   HGBUR NEGATIVE 04/30/2018 0024   BILIRUBINUR NEGATIVE 04/30/2018 0024   KETONESUR NEGATIVE 04/30/2018 0024   PROTEINUR NEGATIVE 04/30/2018 0024   UROBILINOGEN 0.2 10/05/2009 1913   NITRITE NEGATIVE 04/30/2018 0024   LEUKOCYTESUR NEGATIVE 04/30/2018 0024    Sepsis Labs: Lactic Acid, Venous    Component Value Date/Time   LATICACIDVEN 0.73 11/10/2016 1321    MICROBIOLOGY: Recent Results (from the past 240 hour(s))  Blood culture (routine x 2)     Status: Abnormal   Collection Time: 04/29/18  6:46 PM  Result Value Ref Range Status   Specimen Description BLOOD BLOOD RIGHT FOREARM  Final   Special Requests   Final    BOTTLES DRAWN AEROBIC AND ANAEROBIC Blood Culture adequate volume   Culture  Setup Time   Final    GRAM POSITIVE COCCI IN CHAINS IN BOTH AEROBIC AND ANAEROBIC BOTTLES CRITICAL RESULT CALLED TO, READ BACK BY AND VERIFIED WITH: MARTIN PHARMD AT 1136 ON 052019 BY SJW PT IN ED ADMITTED TO [facility] CALLED [facility] Performed at [facility], [address] [place], [place] [address]    Culture ENTEROCOCCUS FAECALIS (A)  Final   Report Status  05/02/2018 FINAL  Final   Organism ID, Bacteria ENTEROCOCCUS FAECALIS  Final      Susceptibility   Enterococcus faecalis - MIC*    AMPICILLIN <=2 SENSITIVE Sensitive     VANCOMYCIN 1 SENSITIVE Sensitive     GENTAMICIN SYNERGY SENSITIVE Sensitive     * ENTEROCOCCUS FAECALIS  Blood Culture ID Panel (Reflexed)     Status: Abnormal   Collection Time: 04/29/18  6:46 PM  Result Value Ref Range Status   Enterococcus species DETECTED (A) NOT DETECTED Final    Comment: CRITICAL RESULT CALLED TO, READ BACK BY AND VERIFIED WITH: KELLY CONNOR RN AT 1135 ON [id number] BY SJW    Vancomycin resistance NOT DETECTED NOT DETECTED Final   Listeria monocytogenes NOT DETECTED NOT DETECTED Final   Staphylococcus species NOT DETECTED NOT DETECTED Final   Staphylococcus aureus NOT DETECTED NOT DETECTED Final   Streptococcus species NOT DETECTED NOT DETECTED Final   Streptococcus agalactiae NOT DETECTED NOT DETECTED Final   Streptococcus pneumoniae NOT DETECTED NOT DETECTED Final   Streptococcus pyogenes NOT DETECTED NOT DETECTED Final   Acinetobacter baumannii NOT DETECTED NOT DETECTED Final   Enterobacteriaceae species NOT DETECTED NOT DETECTED Final   Enterobacter cloacae complex NOT DETECTED NOT DETECTED Final   Escherichia coli NOT DETECTED NOT DETECTED Final   Klebsiella oxytoca NOT DETECTED NOT DETECTED Final   Klebsiella pneumoniae NOT DETECTED NOT DETECTED Final   Proteus species NOT DETECTED NOT DETECTED Final   Serratia marcescens NOT DETECTED NOT DETECTED Final   Haemophilus influenzae NOT DETECTED NOT DETECTED Final   Neisseria meningitidis NOT DETECTED NOT DETECTED Final   Pseudomonas aeruginosa NOT DETECTED NOT DETECTED Final   Candida albicans NOT DETECTED NOT DETECTED Final   Candida glabrata NOT DETECTED NOT DETECTED Final   Candida krusei NOT DETECTED NOT DETECTED Final   Candida parapsilosis NOT DETECTED NOT DETECTED Final   Candida tropicalis NOT DETECTED NOT DETECTED Final    Comment:  Performed at [facility], [address] [place], [place] [address]  Culture, blood (routine x 2)     Status: None (Preliminary result)   Collection Time: 05/01/18  9:27 PM  Result Value Ref Range Status   Specimen Description BLOOD LEFT ANTECUBITAL  Final   Special Requests   Final    BOTTLES DRAWN AEROBIC AND ANAEROBIC Blood Culture adequate volume   Culture   Final    NO GROWTH 2 DAYS Performed at [facility], [address] [place], [place] [address]    Report Status PENDING  Incomplete  Culture, blood (routine x 2)     Status: None (Preliminary result)   Collection Time: 05/01/18  9:32 PM  Result Value Ref Range Status   Specimen Description BLOOD LEFT FOREARM  Final   Special Requests   Final    BOTTLES DRAWN AEROBIC AND ANAEROBIC Blood Culture adequate volume   Culture   Final    NO GROWTH 2 DAYS Performed at [facility], [address], [place], [place] [address]    Report Status PENDING  Incomplete    RADIOLOGY STUDIES/RESULTS: Dg Chest 2 View  Result Date: 04/29/2018 CLINICAL DATA:  Patient with left shoulder pain. EXAM: CHEST - 2 VIEW COMPARISON:  Chest radiograph 01/21/2016. FINDINGS: Stable cardiac and mediastinal contours. Heterogeneous opacities right lung base. No pleural effusion or pneumothorax. Thoracic spine degenerative changes. Surgical hardware left humerus. IMPRESSION: Heterogeneous opacities right lung base may represent scarring. Infection not excluded. Electronically Signed   By: Annia Belt M.D.   On: 04/29/2018 17:20   Ct Angio Chest Pe W/cm &/or Wo Cm  Result Date: 04/29/2018 CLINICAL DATA:  Dyspnea. Left-sided chest/shoulder pain. High pretest probability for pulmonary embolus. EXAM: CT ANGIOGRAPHY CHEST WITH CONTRAST TECHNIQUE: Multidetector CT imaging of the chest was performed using the standard protocol during bolus administration of intravenous contrast. Multiplanar CT image reconstructions and MIPs were obtained to evaluate  the vascular anatomy. CONTRAST:  ISOVUE-370 IOPAMIDOL (ISOVUE-370) INJECTION 76% COMPARISON:  Radiographs 04/29/2018.  Chest CT 01/14/2016 FINDINGS: Cardiovascular: There are no filling defects within the pulmonary arteries to suggest pulmonary embolus. Thoracic aorta is normal in caliber without dissection or aneurysm. The heart is normal in size. No pericardial effusion. Mediastinum/Nodes: Prominent right lower hilar lymph node measures 10 mm short axis, may be reactive. No mediastinal adenopathy. The esophagus is decompressed. No visualized thyroid nodule. Lungs/Pleura: Slight heterogeneous attenuation of lung parenchyma. Areas of bronchial filling in the right lower lobe subsegmental branches, with small rounded intraluminal densities within the right middle lobe bronchus, right and left lower lobe bronchus. Bronchial thickening in the lower lobe bronchi with scattered mucous plugging. Heterogeneous densities in the dependent lower lobes may be atelectasis or infectious. No pulmonary edema. No significant pleural fluid. Upper Abdomen: No acute abnormality. Musculoskeletal: Remote left rib fractures. Remote proximal left humerus fracture post ORIF. There are no acute or suspicious osseous abnormalities. Review of the MIP images confirms the above findings. IMPRESSION: 1. No pulmonary embolus. 2. Heterogeneous lung parenchyma can be seen in the setting of small airways disease. Bronchial thickening in the lower lobes, with areas of mucous plugging. Small rounded intraluminal densities in the right middle, right and left lower lobe bronchi, may be retained secretions or aspiration in the appropriate clinical setting. 3. Dependent lower lobe densities may be atelectasis or infectious pneumonitis, possibly related to bronchial thickening and mucous plugging. Electronically Signed   By: Rubye Oaks M.D.   On: 04/29/2018 22:30   Dg Foot 2 Views Right  Result Date: 04/29/2018 CLINICAL DATA:  Right foot  pain EXAM: RIGHT FOOT - 2 VIEW COMPARISON:  None. FINDINGS: No fracture or malalignment. Joint  spaces are maintained. Soft tissues are unremarkable. IMPRESSION: Negative. Electronically Signed   By: Jasmine Pang M.D.   On: 04/29/2018 19:41     LOS: 4 days   Jeoffrey Massed, MD  [facility]  If 7PM-7AM, please contact night-coverage  Please page via [email or web address]-click on MD name and type text message  05/04/2018, 12:09 PM

## 2018-05-05 ENCOUNTER — Encounter (HOSPITAL_COMMUNITY): Payer: Self-pay | Admitting: Cardiovascular Disease

## 2018-05-05 LAB — CBC
HEMATOCRIT: 32.7 % — AB (ref 39.0–52.0)
Hemoglobin: 10.2 g/dL — ABNORMAL LOW (ref 13.0–17.0)
MCH: 25.6 pg — ABNORMAL LOW (ref 26.0–34.0)
MCHC: 31.2 g/dL (ref 30.0–36.0)
MCV: 82 fL (ref 78.0–100.0)
Platelets: 355 10*3/uL (ref 150–400)
RBC: 3.99 MIL/uL — ABNORMAL LOW (ref 4.22–5.81)
RDW: 13.6 % (ref 11.5–15.5)
WBC: 8.6 10*3/uL (ref 4.0–10.5)

## 2018-05-05 LAB — MAGNESIUM: Magnesium: 2 mg/dL (ref 1.7–2.4)

## 2018-05-05 LAB — BASIC METABOLIC PANEL
ANION GAP: 10 (ref 5–15)
BUN: 9 mg/dL (ref 6–20)
CALCIUM: 9.1 mg/dL (ref 8.9–10.3)
CO2: 31 mmol/L (ref 22–32)
CREATININE: 1.08 mg/dL (ref 0.61–1.24)
Chloride: 97 mmol/L — ABNORMAL LOW (ref 101–111)
GFR calc Af Amer: >60 mL/min (ref >60)
GFR calc non Af Amer: >60 mL/min (ref >60)
GLUCOSE: 104 mg/dL — AB (ref 65–99)
Potassium: 4 mmol/L (ref 3.5–5.1)
Sodium: 138 mmol/L (ref 135–145)

## 2018-05-05 NOTE — Progress Notes (Signed)
Late Entry: Pt's girlfriend brought in pt's x-box to plug up while pt is having to stay for 6 weeks of IV antibiotics. This RN called facilities to have it checked and was told that this was no longer done, that the pt is not supposed to plug up their electronic devices into the tv on the wall due to there being a chance that the pt's device could get damaged. This RN informed the patient of this and he stated that he did not care if it got damaged that he wanted it plugged up. RN told pt that the hospital is not liable for any damage if the pt chooses to plug the device up against advising not to. Pt stated okay.  Larey Days, RN

## 2018-05-05 NOTE — Progress Notes (Signed)
PROGRESS NOTE        PATIENT DETAILS Name: Jeffery Bennett Age: 37 y.o. Sex: male Date of Birth: 21-Jun-1981 Admit Date: 04/29/2018 Admitting Physician Eduard Clos, MD WIO:[id number], No Pcp Per  Brief Narrative: Patient is a 37 y.o. 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  Remains on empiric ampicillin and gentamicin-evaluated by thoracic surgery, with potential plans for aortic valve replacement and the next 1-2 weeks.  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  Cardiothoracic surgery planning on aortic valve replacement in the next 1-2 weeks-as TEE showed perforated aortic cusp and severe AR.  He currently does not have any evidence of CHF, PR interval on EKG is within normal limits.  He clearly is not a candidate for outpatient IV antibiotic treatment through a PICC line-he will need to remain inpatient until further work-up/procedures have been completed.  Orthopantogram-with numerous dental caries/periodontal disease-will require dental evaluation early next week.   IV drug use: Acknowledged being heroin 3 weeks back to this MD yesterday, urine drug screen positive for cocaine as well.  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  It seems that he may be using all these illicit agents in spite of being on methadone as an outpatient.  Remains on methadone-EKG done earlier this morning showed normal QTC.  Chronic hepatitis C: Outpatient follow-up with infectious disease  Mild normocytic anemia: Mild-probably secondary to  acute illness-doubt any further work-up is required-apart from periodic CBC follow-up  DVT Prophylaxis: Prophylactic Lovenox   Code Status: Full code   Family Communication: Mother at bedside  Disposition Plan: Remain inpatient-will likely remain inpatient for several weeks to complete IV antibiotics/and aortic valve replacement  Antimicrobial agents: Anti-infectives (From admission, onward)   Start     Dose/Rate Route Frequency Ordered Stop   04/30/18 1800  gentamicin (GARAMYCIN) IVPB 100 mg     100 mg 200 mL/hr over 30 Minutes Intravenous Every 12 hours 04/30/18 1204     04/30/18 1200  vancomycin (VANCOCIN) IVPB 1000 mg/200 mL premix  Status:  Discontinued     1,000 mg 200 mL/hr over 60 Minutes Intravenous Every 8 hours 04/30/18 0237 04/30/18 1154   04/30/18 1200  ampicillin (OMNIPEN) 2 g in sodium chloride 0.9 % 100 mL IVPB     2 g 300 mL/hr over 20 Minutes Intravenous Every 4 hours 04/30/18 1154     04/30/18 0245  vancomycin (VANCOCIN) 1,500 mg in sodium chloride 0.9 % 500 mL IVPB     1,500 mg 250 mL/hr over 120 Minutes Intravenous  Once 04/30/18 0237 04/30/18 0555      Procedures: 5/24>>TEE  CONSULTS:  ID and TCTS  Time spent: 25 minutes-Greater than 50% of this time was spent in counseling, explanation of diagnosis, planning of further management, and coordination of care.  MEDICATIONS: Scheduled Meds: . enoxaparin (LOVENOX) injection  40 mg Subcutaneous Q24H  . methadone  100 mg Oral Daily  . nicotine  21 mg Transdermal Daily   Continuous Infusions: .  ampicillin (OMNIPEN) IV Stopped (05/05/18 1435)  . gentamicin 100 mg (05/05/18 0619)   PRN Meds:.acetaminophen **OR** acetaminophen, ondansetron **OR** ondansetron (ZOFRAN) IV   PHYSICAL EXAM: Vital signs: Vitals:   05/04/18 1809 05/05/18 0500 05/05/18 0501 05/05/18 0936  BP: (!) 107/53  (!) 102/48 (!) 108/45  Pulse: 77  75 72  Resp: Temp: 97.8 F (36.6 C)  98 F (36.7 C) 98 F (36.7 C)    TempSrc: Oral  Oral Oral  SpO2: 97%  98% 96%  Weight:  78.9 kg (173 lb 15.1 oz)    Height:       Filed Weights   05/04/18 0500 05/04/18 0715 05/05/18 0500  Weight: 79.2 kg (174 lb 9.7 oz) 78.9 kg (174 lb) 78.9 kg (173 lb 15.1 oz)   Body mass index is 25.69 kg/m.   General appearance:Awake, alert, not in any distress.  Eyes:no scleral icterus. HEENT: Atraumatic and Normocephalic Neck: supple, no JVD. Resp:Good air entry bilaterally,no rales or rhonchi CVS: S1 S2 regular GI: Bowel sounds present, Non tender and not distended with no gaurding, rigidity or rebound. Extremities: B/L Lower Ext shows no edema, both legs are warm to touch Neurology:  Non focal Psychiatric: Normal judgment and insight. Normal mood. Musculoskeletal:No digital cyanosis Skin:No Rash, warm and dry Wounds:N/A  I have personally reviewed following labs and imaging studies  LABORATORY DATA: CBC: Recent Labs  Lab 04/29/18 1846 04/30/18 0232 05/01/18 0602 05/02/18 0335 05/03/18 0649 05/04/18 0659 05/05/18 0449  WBC 11.3* 8.5 8.1 9.0 7.8 8.8 8.6  NEUTROABS 8.2* 5.6  --   --   --   --   --   HGB 11.1* 10.2* 10.5* 10.3* 10.9* 11.0* 10.2*  HCT 35.1* 31.9* 32.8* 32.5* 35.2* 35.7* 32.7*  MCV 82.2 81.8 82.0 82.3 82.8 82.8 82.0  PLT 321 286 287 278 336 358 355    Basic Metabolic Panel: Recent Labs  Lab 05/01/18 0602 05/02/18 0335 05/03/18 0649 05/04/18 0659 05/05/18 0449  NA 137 138 140 138 138  K 3.7 4.2 4.0 4.0 4.0  CL 102 100* 99* 97* 97*  CO2 26 28 33* 33* 31  GLUCOSE 130* 113* 96 93 104*  BUN 7 6 5* 8 9  CREATININE 0.88 0.88 0.91 1.09 1.08  CALCIUM 8.5* 8.8* 9.0 9.1 9.1  MG  --   --   --   --  2.0    GFR: Estimated Creatinine Clearance: 94.6 mL/min (by C-G formula based on SCr of 1.08 mg/dL).  Liver Function Tests: Recent Labs  Lab 04/29/18 1846 04/30/18 0232  AST 13* 12*  ALT 12* 10*  ALKPHOS 73 66  BILITOT 0.4 0.3  PROT 7.4 6.3*  ALBUMIN 2.9* 2.5*   Recent Labs  Lab  04/29/18 1846  LIPASE 24   No results for input(s): AMMONIA in the last 168 hours.  Coagulation Profile: No results for input(s): INR, PROTIME in the last 168 hours.  Cardiac Enzymes: No results for input(s): CKTOTAL, CKMB, CKMBINDEX, TROPONINI in the last 168 hours.  BNP (last 3 results) No results for input(s): PROBNP in the last 8760 hours.  HbA1C: No results for input(s): HGBA1C in the last 72 hours.  CBG: No results for input(s): GLUCAP in the last 168 hours.  Lipid Profile: No results for input(s): CHOL, HDL, LDLCALC, TRIG, CHOLHDL, LDLDIRECT in the last 72 hours.  Thyroid Function Tests: No results for input(s): TSH, T4TOTAL, FREET4, T3FREE, THYROIDAB in the last 72 hours.  Anemia Panel: No  results for input(s): VITAMINB12, FOLATE, FERRITIN, TIBC, IRON, RETICCTPCT in the last 72 hours.  Urine analysis:    Component Value Date/Time   COLORURINE YELLOW 04/30/2018 0024   APPEARANCEUR CLEAR 04/30/2018 0024   LABSPEC >1.046 (H) 04/30/2018 0024   PHURINE 5.0 04/30/2018 0024   GLUCOSEU NEGATIVE 04/30/2018 0024   HGBUR NEGATIVE 04/30/2018 0024   BILIRUBINUR NEGATIVE 04/30/2018 0024   KETONESUR NEGATIVE 04/30/2018 0024   PROTEINUR NEGATIVE 04/30/2018 0024   UROBILINOGEN 0.2 10/05/2009 1913   NITRITE NEGATIVE 04/30/2018 0024   LEUKOCYTESUR NEGATIVE 04/30/2018 0024    Sepsis Labs: Lactic Acid, Venous    Component Value Date/Time   LATICACIDVEN 0.73 11/10/2016 1321    MICROBIOLOGY: Recent Results (from the past 240 hour(s))  Blood culture (routine x 2)     Status: Abnormal   Collection Time: 04/29/18  6:46 PM  Result Value Ref Range Status   Specimen Description BLOOD BLOOD RIGHT FOREARM  Final   Special Requests   Final    BOTTLES DRAWN AEROBIC AND ANAEROBIC Blood Culture adequate volume   Culture  Setup Time   Final    GRAM POSITIVE COCCI IN CHAINS IN BOTH AEROBIC AND ANAEROBIC BOTTLES CRITICAL RESULT CALLED TO, READ BACK BY AND VERIFIED WITH: MARTIN  PHARMD AT 1136 ON 052019 BY SJW PT IN ED ADMITTED TO [facility] CALLED [facility] AND [facility] Performed at [facility], [address], [place], [place] [address]    Culture ENTEROCOCCUS FAECALIS (A)  Final   Report Status 05/02/2018 FINAL  Final   Organism ID, Bacteria ENTEROCOCCUS FAECALIS  Final      Susceptibility   Enterococcus faecalis - MIC*    AMPICILLIN <=2 SENSITIVE Sensitive     VANCOMYCIN 1 SENSITIVE Sensitive     GENTAMICIN SYNERGY SENSITIVE Sensitive     * ENTEROCOCCUS FAECALIS  Blood Culture ID Panel (Reflexed)     Status: Abnormal   Collection Time: 04/29/18  6:46 PM  Result Value Ref Range Status   Enterococcus species DETECTED (A) NOT DETECTED Final    Comment: CRITICAL RESULT CALLED TO, READ BACK BY AND VERIFIED WITH: KELLY CONNOR RN AT 1135 ON [id number] BY SJW    Vancomycin resistance NOT DETECTED NOT DETECTED Final   Listeria monocytogenes NOT DETECTED NOT DETECTED Final   Staphylococcus species NOT DETECTED NOT DETECTED Final   Staphylococcus aureus NOT DETECTED NOT DETECTED Final   Streptococcus species NOT DETECTED NOT DETECTED Final   Streptococcus agalactiae NOT DETECTED NOT DETECTED Final   Streptococcus pneumoniae NOT DETECTED NOT DETECTED Final   Streptococcus pyogenes NOT DETECTED NOT DETECTED Final   Acinetobacter baumannii NOT DETECTED NOT DETECTED Final   Enterobacteriaceae species NOT DETECTED NOT DETECTED Final   Enterobacter cloacae complex NOT DETECTED NOT DETECTED Final   Escherichia coli NOT DETECTED NOT DETECTED Final   Klebsiella oxytoca NOT DETECTED NOT DETECTED Final   Klebsiella pneumoniae NOT DETECTED NOT DETECTED Final   Proteus species NOT DETECTED NOT DETECTED Final   Serratia marcescens NOT DETECTED NOT DETECTED Final   Haemophilus influenzae NOT DETECTED NOT DETECTED Final   Neisseria meningitidis NOT DETECTED NOT DETECTED Final   Pseudomonas aeruginosa NOT DETECTED NOT DETECTED Final   Candida albicans NOT DETECTED NOT DETECTED Final    Candida glabrata NOT DETECTED NOT DETECTED Final   Candida krusei NOT DETECTED NOT DETECTED Final   Candida parapsilosis NOT DETECTED NOT DETECTED Final   Candida tropicalis NOT DETECTED NOT DETECTED Final    Comment: Performed at [facility], [address] [place], [place] [address]  Culture, blood (routine x 2)     Status: None (Preliminary result)   Collection Time: 05/01/18  9:27 PM  Result Value Ref Range Status   Specimen Description BLOOD LEFT ANTECUBITAL  Final   Special Requests   Final    BOTTLES DRAWN AEROBIC AND ANAEROBIC Blood Culture adequate volume   Culture   Final    NO GROWTH 4 DAYS Performed at [facility], [address] [place], [place] [address]    Report Status PENDING  Incomplete  Culture, blood (routine x 2)     Status: None (Preliminary result)   Collection Time: 05/01/18  9:32 PM  Result Value Ref Range Status   Specimen Description BLOOD LEFT FOREARM  Final   Special Requests   Final    BOTTLES DRAWN AEROBIC AND ANAEROBIC Blood Culture adequate volume   Culture   Final    NO GROWTH 4 DAYS Performed at [facility], [address], [place], [place] [address]    Report Status PENDING  Incomplete    RADIOLOGY STUDIES/RESULTS: Dg Orthopantogram  Result Date: 05/04/2018 CLINICAL DATA:  Bottom right and top left tooth pain. EXAM: ORTHOPANTOGRAM/PANORAMIC COMPARISON:  None. FINDINGS: Dental caries of the right lower first and third molars are suggested characterized by tooth deformity and lucencies. Periapical lucencies consistent with diffuse periodontal disease more notably about the right and left canines and bilateral upper lateral incisors. Dental caries of the left upper remaining molar. IMPRESSION: Dental caries as above with scattered areas of periapical lucencies compatible with periodontal disease. Electronically Signed   By: Tollie Eth M.D.   On: 05/04/2018 22:44   Dg Chest 2 View  Result Date: 04/29/2018 CLINICAL DATA:   Patient with left shoulder pain. EXAM: CHEST - 2 VIEW COMPARISON:  Chest radiograph 01/21/2016. FINDINGS: Stable cardiac and mediastinal contours. Heterogeneous opacities right lung base. No pleural effusion or pneumothorax. Thoracic spine degenerative changes. Surgical hardware left humerus. IMPRESSION: Heterogeneous opacities right lung base may represent scarring. Infection not excluded. Electronically Signed   By: Annia Belt M.D.   On: 04/29/2018 17:20   Ct Angio Chest Pe W/cm &/or Wo Cm  Result Date: 04/29/2018 CLINICAL DATA:  Dyspnea. Left-sided chest/shoulder pain. High pretest probability for pulmonary embolus. EXAM: CT ANGIOGRAPHY CHEST WITH CONTRAST TECHNIQUE: Multidetector CT imaging of the chest was performed using the standard protocol during bolus administration of intravenous contrast. Multiplanar CT image reconstructions and MIPs were obtained to evaluate the vascular anatomy. CONTRAST:  ISOVUE-370 IOPAMIDOL (ISOVUE-370) INJECTION 76% COMPARISON:  Radiographs 04/29/2018.  Chest CT 01/14/2016 FINDINGS: Cardiovascular: There are no filling defects within the pulmonary arteries to suggest pulmonary embolus. Thoracic aorta is normal in caliber without dissection or aneurysm. The heart is normal in size. No pericardial effusion. Mediastinum/Nodes: Prominent right lower hilar lymph node measures 10 mm short axis, may be reactive. No mediastinal adenopathy. The esophagus is decompressed. No visualized thyroid nodule. Lungs/Pleura: Slight heterogeneous attenuation of lung parenchyma. Areas of bronchial filling in the right lower lobe subsegmental branches, with small rounded intraluminal densities within the right middle lobe bronchus, right and left lower lobe bronchus. Bronchial thickening in the lower lobe bronchi with scattered mucous plugging. Heterogeneous densities in the dependent lower lobes may be atelectasis or infectious. No pulmonary edema. No significant pleural fluid. Upper Abdomen:  No acute abnormality. Musculoskeletal: Remote left rib fractures. Remote proximal left humerus fracture post ORIF. There are no acute or suspicious  osseous abnormalities. Review of the MIP images confirms the above findings. IMPRESSION: 1. No pulmonary embolus. 2. Heterogeneous lung parenchyma can be seen in the setting of small airways disease. Bronchial thickening in the lower lobes, with areas of mucous plugging. Small rounded intraluminal densities in the right middle, right and left lower lobe bronchi, may be retained secretions or aspiration in the appropriate clinical setting. 3. Dependent lower lobe densities may be atelectasis or infectious pneumonitis, possibly related to bronchial thickening and mucous plugging. Electronically Signed   By: Rubye Oaks M.D.   On: 04/29/2018 22:30   Dg Foot 2 Views Right  Result Date: 04/29/2018 CLINICAL DATA:  Right foot pain EXAM: RIGHT FOOT - 2 VIEW COMPARISON:  None. FINDINGS: No fracture or malalignment. Joint spaces are maintained. Soft tissues are unremarkable. IMPRESSION: Negative. Electronically Signed   By: Jasmine Pang M.D.   On: 04/29/2018 19:41     LOS: 5 days   Jeoffrey Massed, MD  [facility]  If 7PM-7AM, please contact night-coverage  Please page via [email or web address]-click on MD name and type text message  05/05/2018, 2:57 PM

## 2018-05-06 DIAGNOSIS — R071 Chest pain on breathing: Secondary | ICD-10-CM

## 2018-05-06 DIAGNOSIS — B1710 Acute hepatitis C without hepatic coma: Secondary | ICD-10-CM

## 2018-05-06 LAB — CBC
HCT: 33.6 % — ABNORMAL LOW (ref 39.0–52.0)
Hemoglobin: 10.4 g/dL — ABNORMAL LOW (ref 13.0–17.0)
MCH: 25.7 pg — ABNORMAL LOW (ref 26.0–34.0)
MCHC: 31 g/dL (ref 30.0–36.0)
MCV: 83 fL (ref 78.0–100.0)
Platelets: 340 10*3/uL (ref 150–400)
RBC: 4.05 MIL/uL — ABNORMAL LOW (ref 4.22–5.81)
RDW: 13.7 % (ref 11.5–15.5)
WBC: 8.6 10*3/uL (ref 4.0–10.5)

## 2018-05-06 LAB — CULTURE, BLOOD (ROUTINE X 2)
CULTURE: NO GROWTH
Culture: NO GROWTH
SPECIAL REQUESTS: ADEQUATE
Special Requests: ADEQUATE

## 2018-05-06 LAB — BASIC METABOLIC PANEL
Anion gap: 8 (ref 5–15)
BUN: 9 mg/dL (ref 6–20)
CALCIUM: 8.8 mg/dL — AB (ref 8.9–10.3)
CO2: 31 mmol/L (ref 22–32)
CREATININE: 1.13 mg/dL (ref 0.61–1.24)
Chloride: 99 mmol/L — ABNORMAL LOW (ref 101–111)
GFR calc Af Amer: >60 mL/min (ref >60)
Glucose, Bld: 106 mg/dL — ABNORMAL HIGH (ref 65–99)
Potassium: 3.9 mmol/L (ref 3.5–5.1)
SODIUM: 138 mmol/L (ref 135–145)

## 2018-05-06 MED ORDER — NICOTINE 21 MG/24HR TD PT24: 21.0000 mg | MEDICATED_PATCH | Freq: Once | TRANSDERMAL | Status: DC

## 2018-05-06 MED ORDER — NICOTINE 21 MG/24HR TD PT24
21.0000 mg | MEDICATED_PATCH | Freq: Every day | TRANSDERMAL | Status: DC
  Administered 2018-05-06 – 2018-05-27 (×22): 21 mg via TRANSDERMAL
  Filled 2018-05-06 (×22): qty 1

## 2018-05-06 NOTE — Progress Notes (Signed)
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   Gentamicin trough is appropriate on 5/22 (0.5), Scr slightly increased. 5/24 TEE showed potential vegetation on aortic valve.  Plan: Ampicillin 2g IV q4h Gentamicin  IV q12h for synergy dosing Plan for at least weekly GT to ensure staying <1 for synergy (next check probably around 05/10/18)   Height:  (175.3 cm) Weight: 173 lb 15.1 oz (78.9 kg) IBW/kg (Calculated) : 70.7  Temp (24hrs), Avg:98 F (36.7 C), Min:97.9 F (36.6 C), Max:98.1 F (36.7 C)  Recent Labs  Lab 05/02/18 0335 05/02/18 1708 05/03/18 0649 05/04/18 0659 05/05/18 0449 05/06/18 0604  WBC 9.0  --  7.8 8.8 8.6 8.6  CREATININE 0.88  --  0.91 1.09 1.08 1.13  GENTTROUGH  --  0.5  --   --   --   --     Estimated Creatinine Clearance: 90.4 mL/min (by C-G formula based on SCr of 1.13 mg/dL).    No Known Allergies   Vancomycin 5/20 x1 Ampicillin 5/20>> Gentamicin 5/20>> *5/22 GT 0.5  5/19 BCx: 2/2 enterococcus > pan sens 5/19 BCID: enterococcus, no resistance  Donnella Bi, PharmD PGY1 Acute Care Pharmacy Resident 05/06/2018 12:41 PM

## 2018-05-06 NOTE — Progress Notes (Signed)
PROGRESS NOTE        PATIENT DETAILS Name: Jeffery Bennett Age: 37 y.o. Sex: male Date of Birth: 1981-12-05 Admit Date: 04/29/2018 Admitting Physician Eduard Clos, MD ZOX:[id number], No Pcp Per  Brief Narrative:  Patient is a 37 y.o. 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  Remains on empiric ampicillin and gentamicin-evaluated by thoracic surgery, with potential plans for aortic valve replacement and the next 1-2 weeks.  See below for further details  Subjective:  Patient in bed, appears comfortable, denies any headache, no fever, no chest pain or pressure, no shortness of breath , no abdominal pain. No focal weakness.  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  Orthopantogram also reveals some dental caries and periodontal disease for which I have called and requested dentist Dr. Robin Searing to evaluate the patient coming Tuesday, now seems to be afebrile and nonseptic.  TEE showed perforated aortic cusp with severe AR.  IV drug use: Acknowledged being heroin 3 weeks back to this MD yesterday, urine drug screen positive for cocaine as well.  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  It seems that he may be using all these illicit agents in spite of being on methadone as an outpatient.  Remains on methadone-EKG done earlier this morning showed normal QTC.  Agent was extensively counseled.  Chronic hepatitis C: Outpatient follow-up with infectious disease  Mild normocytic anemia: Mild-probably  secondary to acute illness-doubt any further work-up is required-apart from periodic CBC follow-up.   DVT Prophylaxis: Prophylactic Lovenox   Code Status: Full code   Family Communication: Mother at bedside  Disposition Plan: Remain inpatient-will likely remain inpatient for several weeks to complete IV antibiotics/and aortic valve replacement  Antimicrobial agents: Anti-infectives (From admission, onward)   Start     Dose/Rate Route Frequency Ordered Stop   04/30/18 1800  gentamicin (GARAMYCIN) IVPB 100 mg     100 mg 200 mL/hr over 30 Minutes Intravenous Every 12 hours 04/30/18 1204     04/30/18 1200  vancomycin (VANCOCIN) IVPB 1000 mg/200 mL premix  Status:  Discontinued     1,000 mg 200 mL/hr over 60 Minutes Intravenous Every 8 hours 04/30/18 0237 04/30/18 1154   04/30/18 1200  ampicillin (OMNIPEN) 2 g in sodium chloride 0.9 % 100 mL IVPB     2 g 300 mL/hr over 20 Minutes Intravenous Every 4 hours 04/30/18 1154     04/30/18 0245  vancomycin (VANCOCIN) 1,500 mg in sodium chloride 0.9 % 500 mL IVPB     1,500 mg 250 mL/hr over 120 Minutes Intravenous  Once 04/30/18 0237 04/30/18 0555      Procedures: 5/24>>TEE  CONSULTS:  ID and TCTS  Time spent: 25 minutes-Greater than 50% of this time was spent in counseling, explanation of diagnosis, planning of further management, and coordination of care.  MEDICATIONS: Scheduled Meds: . enoxaparin (LOVENOX) injection  40 mg Subcutaneous Q24H  . methadone  100 mg Oral Daily  . nicotine  21 mg Transdermal Daily   Continuous Infusions: . ampicillin (OMNIPEN) IV Stopped (05/06/18 0848)  . gentamicin Stopped (05/06/18 1610)   PRN Meds:.acetaminophen **OR** acetaminophen, ondansetron **OR** ondansetron (ZOFRAN) IV   PHYSICAL EXAM: Vital signs: Vitals:   05/05/18 1730 05/05/18 2121 05/06/18 0500 05/06/18 0956  BP: (!) 105/45 (!) 109/49  (!) 106/45  Pulse: 69 77  81  Resp: Temp: 98.1 F (36.7 C) 97.9 F (36.6 C)   98.1 F (36.7 C)  TempSrc: Oral Oral  Oral  SpO2: 97% 96%  98%  Weight:  78.9 kg (173 lb 15.1 oz) 78.9 kg (173 lb 15.1 oz)   Height:       Filed Weights   05/05/18 0500 05/05/18 2121 05/06/18 0500  Weight: 78.9 kg (173 lb 15.1 oz) 78.9 kg (173 lb 15.1 oz) 78.9 kg (173 lb 15.1 oz)   Body mass index is 25.69 kg/m.   Exam  Awake Alert, Oriented X 3, No new F.N deficits, Normal affect [place].AT,PERRAL Supple Neck,No JVD, No cervical lymphadenopathy appriciated.  Symmetrical Chest wall movement, Good air movement bilaterally, CTAB RRR,No Gallops, Rubs, No Parasternal Heave, severe continuous aortic valve murmur, Osler's node in the right second toe stable +ve B.Sounds, Abd Soft, No tenderness, No organomegaly appriciated, No rebound - guarding or rigidity. No Cyanosis, Clubbing or edema, No new Rash or bruise  I have personally reviewed following labs and imaging studies  LABORATORY DATA: CBC: Recent Labs  Lab 04/29/18 1846 04/30/18 0232  05/02/18 0335 05/03/18 9604 05/04/18 0659 05/05/18 0449 05/06/18 0604  WBC 11.3* 8.5   < > 9.0 7.8 8.8 8.6 8.6  NEUTROABS 8.2* 5.6  --   --   --   --   --   --   HGB 11.1* 10.2*   < > 10.3* 10.9* 11.0* 10.2* 10.4*  HCT 35.1* 31.9*   < > 32.5* 35.2* 35.7* 32.7* 33.6*  MCV 82.2 81.8   < > 82.3 82.8 82.8 82.0 83.0  PLT 321 286   < > 278 336 358 355 340   < > = values in this interval not displayed.    Basic Metabolic Panel: Recent Labs  Lab 05/02/18 0335 05/03/18 0649 05/04/18 0659 05/05/18 0449 05/06/18 0604  NA 138 140 138 138 138  K 4.2 4.0 4.0 4.0 3.9  CL 100* 99* 97* 97* 99*  CO2 28 33* 33* 31 31  GLUCOSE 113* 96 93 104* 106*  BUN 6 5* CREATININE 0.88 0.91 1.09 1.08 1.13  CALCIUM 8.8* 9.0 9.1 9.1 8.8*  MG  --   --   --  2.0  --     GFR: Estimated Creatinine Clearance: 90.4 mL/min (by C-G formula based on SCr of 1.13 mg/dL).  Liver Function Tests: Recent Labs  Lab 04/29/18 1846 04/30/18 0232  AST 13* 12*  ALT  12* 10*  ALKPHOS 73 66  BILITOT 0.4 0.3  PROT 7.4 6.3*  ALBUMIN 2.9* 2.5*   Recent Labs  Lab 04/29/18 1846  LIPASE 24   No results for input(s): AMMONIA in the last 168 hours.  Coagulation Profile: No results for input(s): INR, PROTIME in the last 168 hours.  Cardiac Enzymes: No results for input(s): CKTOTAL, CKMB, CKMBINDEX, TROPONINI in the last 168 hours.  BNP (last 3 results) No results for input(s): PROBNP in the last 8760 hours.  HbA1C: No results for input(s): HGBA1C in the last  72 hours.  CBG: No results for input(s): GLUCAP in the last 168 hours.  Lipid Profile: No results for input(s): CHOL, HDL, LDLCALC, TRIG, CHOLHDL, LDLDIRECT in the last 72 hours.  Thyroid Function Tests: No results for input(s): TSH, T4TOTAL, FREET4, T3FREE, THYROIDAB in the last 72 hours.  Anemia Panel: No results for input(s): VITAMINB12, FOLATE, FERRITIN, TIBC, IRON, RETICCTPCT in the last 72 hours.  Urine analysis:    Component Value Date/Time   COLORURINE YELLOW 04/30/2018 0024   APPEARANCEUR CLEAR 04/30/2018 0024   LABSPEC >1.046 (H) 04/30/2018 0024   PHURINE 5.0 04/30/2018 0024   GLUCOSEU NEGATIVE 04/30/2018 0024   HGBUR NEGATIVE 04/30/2018 0024   BILIRUBINUR NEGATIVE 04/30/2018 0024   KETONESUR NEGATIVE 04/30/2018 0024   PROTEINUR NEGATIVE 04/30/2018 0024   UROBILINOGEN 0.2 10/05/2009 1913   NITRITE NEGATIVE 04/30/2018 0024   LEUKOCYTESUR NEGATIVE 04/30/2018 0024    Sepsis Labs: Lactic Acid, Venous    Component Value Date/Time   LATICACIDVEN 0.73 11/10/2016 1321    MICROBIOLOGY: Recent Results (from the past 240 hour(s))  Blood culture (routine x 2)     Status: Abnormal   Collection Time: 04/29/18  6:46 PM  Result Value Ref Range Status   Specimen Description BLOOD BLOOD RIGHT FOREARM  Final   Special Requests   Final    BOTTLES DRAWN AEROBIC AND ANAEROBIC Blood Culture adequate volume   Culture  Setup Time   Final    GRAM POSITIVE COCCI IN CHAINS IN BOTH  AEROBIC AND ANAEROBIC BOTTLES CRITICAL RESULT CALLED TO, READ BACK BY AND VERIFIED WITH: MARTIN PHARMD AT 1136 ON 052019 BY SJ[facility] PT IN ED ADMITTED TO [facility] CALLED [facility] AND [facility] Performed at [facility], [address] [place], [place] [address]    Culture ENTEROCOCCUS FAECALIS (A)  Final   Report Status 05/02/2018 FINAL  Final   Organism ID, Bacteria ENTEROCOCCUS FAECALIS  Final      Susceptibility   Enterococcus faecalis - MIC*    AMPICILLIN <=2 SENSITIVE Sensitive     VANCOMYCIN 1 SENSITIVE Sensitive     GENTAMICIN SYNERGY SENSITIVE Sensitive     * ENTEROCOCCUS FAECALIS  Blood Culture ID Panel (Reflexed)     Status: Abnormal   Collection Time: 04/29/18  6:46 PM  Result Value Ref Range Status   Enterococcus species DETECTED (A) NOT DETECTED Final    Comment: CRITICAL RESULT CALLED TO, READ BACK BY AND VERIFIED WITH: KELLY CONNOR RN AT 1135 ON [id number] BY SJW    Vancomycin resistance NOT DETECTED NOT DETECTED Final   Listeria monocytogenes NOT DETECTED NOT DETECTED Final   Staphylococcus species NOT DETECTED NOT DETECTED Final   Staphylococcus aureus NOT DETECTED NOT DETECTED Final   Streptococcus species NOT DETECTED NOT DETECTED Final   Streptococcus agalactiae NOT DETECTED NOT DETECTED Final   Streptococcus pneumoniae NOT DETECTED NOT DETECTED Final   Streptococcus pyogenes NOT DETECTED NOT DETECTED Final   Acinetobacter baumannii NOT DETECTED NOT DETECTED Final   Enterobacteriaceae species NOT DETECTED NOT DETECTED Final   Enterobacter cloacae complex NOT DETECTED NOT DETECTED Final   Escherichia coli NOT DETECTED NOT DETECTED Final   Klebsiella oxytoca NOT DETECTED NOT DETECTED Final   Klebsiella pneumoniae NOT DETECTED NOT DETECTED Final   Proteus species NOT DETECTED NOT DETECTED Final   Serratia marcescens NOT DETECTED NOT DETECTED Final   Haemophilus influenzae NOT DETECTED NOT DETECTED Final   Neisseria meningitidis NOT DETECTED NOT DETECTED Final   Pseudomonas  aeruginosa NOT DETECTED NOT DETECTED  Final   Candida albicans NOT DETECTED NOT DETECTED Final   Candida glabrata NOT DETECTED NOT DETECTED Final   Candida krusei NOT DETECTED NOT DETECTED Final   Candida parapsilosis NOT DETECTED NOT DETECTED Final   Candida tropicalis NOT DETECTED NOT DETECTED Final    Comment: Performed at [facility], [address], [place], [place] [address]  Culture, blood (routine x 2)     Status: None   Collection Time: 05/01/18  9:27 PM  Result Value Ref Range Status   Specimen Description BLOOD LEFT ANTECUBITAL  Final   Special Requests   Final    BOTTLES DRAWN AEROBIC AND ANAEROBIC Blood Culture adequate volume   Culture   Final    NO GROWTH 5 DAYS Performed at [facility], [address], [place], [place] [address]    Report Status 05/06/2018 FINAL  Final  Culture, blood (routine x 2)     Status: None   Collection Time: 05/01/18  9:32 PM  Result Value Ref Range Status   Specimen Description BLOOD LEFT FOREARM  Final   Special Requests   Final    BOTTLES DRAWN AEROBIC AND ANAEROBIC Blood Culture adequate volume   Culture   Final    NO GROWTH 5 DAYS Performed at [facility], [address] [place], [place] [address]    Report Status 05/06/2018 FINAL  Final    RADIOLOGY STUDIES/RESULTS: Dg Orthopantogram  Result Date: 05/04/2018 CLINICAL DATA:  Bottom right and top left tooth pain. EXAM: ORTHOPANTOGRAM/PANORAMIC COMPARISON:  None. FINDINGS: Dental caries of the right lower first and third molars are suggested characterized by tooth deformity and lucencies. Periapical lucencies consistent with diffuse periodontal disease more notably about the right and left canines and bilateral upper lateral incisors. Dental caries of the left upper remaining molar. IMPRESSION: Dental caries as above with scattered areas of periapical lucencies compatible with periodontal disease. Electronically Signed   By: Tollie Eth M.D.   On: 05/04/2018 22:44    Dg Chest 2 View  Result Date: 04/29/2018 CLINICAL DATA:  Patient with left shoulder pain. EXAM: CHEST - 2 VIEW COMPARISON:  Chest radiograph 01/21/2016. FINDINGS: Stable cardiac and mediastinal contours. Heterogeneous opacities right lung base. No pleural effusion or pneumothorax. Thoracic spine degenerative changes. Surgical hardware left humerus. IMPRESSION: Heterogeneous opacities right lung base may represent scarring. Infection not excluded. Electronically Signed   By: Annia Belt M.D.   On: 04/29/2018 17:20   Ct Angio Chest Pe W/cm &/or Wo Cm  Result Date: 04/29/2018 CLINICAL DATA:  Dyspnea. Left-sided chest/shoulder pain. High pretest probability for pulmonary embolus. EXAM: CT ANGIOGRAPHY CHEST WITH CONTRAST TECHNIQUE: Multidetector CT imaging of the chest was performed using the standard protocol during bolus administration of intravenous contrast. Multiplanar CT image reconstructions and MIPs were obtained to evaluate the vascular anatomy. CONTRAST:  ISOVUE-370 IOPAMIDOL (ISOVUE-370) INJECTION 76% COMPARISON:  Radiographs 04/29/2018.  Chest CT 01/14/2016 FINDINGS: Cardiovascular: There are no filling defects within the pulmonary arteries to suggest pulmonary embolus. Thoracic aorta is normal in caliber without dissection or aneurysm. The heart is normal in size. No pericardial effusion. Mediastinum/Nodes: Prominent right lower hilar lymph node measures 10 mm short axis, may be reactive. No mediastinal adenopathy. The esophagus is decompressed. No visualized thyroid nodule. Lungs/Pleura: Slight heterogeneous attenuation of lung parenchyma. Areas of bronchial filling in the right lower lobe subsegmental branches, with small rounded intraluminal densities within the right middle lobe bronchus, right and left lower lobe bronchus. Bronchial thickening  in the lower lobe bronchi with scattered mucous plugging. Heterogeneous densities in the dependent lower lobes may be atelectasis or infectious.  No pulmonary edema. No significant pleural fluid. Upper Abdomen: No acute abnormality. Musculoskeletal: Remote left rib fractures. Remote proximal left humerus fracture post ORIF. There are no acute or suspicious osseous abnormalities. Review of the MIP images confirms the above findings. IMPRESSION: 1. No pulmonary embolus. 2. Heterogeneous lung parenchyma can be seen in the setting of small airways disease. Bronchial thickening in the lower lobes, with areas of mucous plugging. Small rounded intraluminal densities in the right middle, right and left lower lobe bronchi, may be retained secretions or aspiration in the appropriate clinical setting. 3. Dependent lower lobe densities may be atelectasis or infectious pneumonitis, possibly related to bronchial thickening and mucous plugging. Electronically Signed   By: Rubye Oaks M.D.   On: 04/29/2018 22:30   Dg Foot 2 Views Right  Result Date: 04/29/2018 CLINICAL DATA:  Right foot pain EXAM: RIGHT FOOT - 2 VIEW COMPARISON:  None. FINDINGS: No fracture or malalignment. Joint spaces are maintained. Soft tissues are unremarkable. IMPRESSION: Negative. Electronically Signed   By: Jasmine Pang M.D.   On: 04/29/2018 19:41     LOS: 6 days   Signature  Susa Raring M.D on 05/06/2018 at 12:21 PM  Between 7am to 7pm - Pager - [phone] ( page via [email or web address], text pages only, please mention full 10 digit call back number).  After 7pm go to [email or web address] - password [email or web address]

## 2018-05-06 NOTE — Progress Notes (Signed)
Pt lost IV access on day shift. IV team tried to stick patient, but patient is being very particular in where he wants IV. Pt was telling IV RN that he only wanted it in the right forearm and no where else. Antibiotics were due at 2000. This RN spoke with patient and informed him that these antibiotics are serious, that the patient's life depends on having these antibiotics because of his heart. Patient stated he understood and would be willing to let them stick the other arm.   Larey Days, RN

## 2018-05-07 ENCOUNTER — Other Ambulatory Visit: Payer: Self-pay

## 2018-05-07 ENCOUNTER — Inpatient Hospital Stay: Payer: Self-pay

## 2018-05-07 LAB — MAGNESIUM: MAGNESIUM: 2.1 mg/dL (ref 1.7–2.4)

## 2018-05-07 LAB — CREATININE, SERUM
Creatinine, Ser: 1.18 mg/dL (ref 0.61–1.24)
GFR calc Af Amer: >60 mL/min (ref >60)
GFR calc non Af Amer: >60 mL/min (ref >60)

## 2018-05-07 LAB — BASIC METABOLIC PANEL
Anion gap: 9 (ref 5–15)
BUN: 10 mg/dL (ref 6–20)
CO2: 30 mmol/L (ref 22–32)
CREATININE: 1.15 mg/dL (ref 0.61–1.24)
Calcium: 9 mg/dL (ref 8.9–10.3)
Chloride: 100 mmol/L — ABNORMAL LOW (ref 101–111)
GFR calc Af Amer: >60 mL/min (ref >60)
GFR calc non Af Amer: >60 mL/min (ref >60)
GLUCOSE: 110 mg/dL — AB (ref 65–99)
Potassium: 4 mmol/L (ref 3.5–5.1)
SODIUM: 139 mmol/L (ref 135–145)

## 2018-05-07 MED ORDER — PNEUMOCOCCAL VAC POLYVALENT 25 MCG/0.5ML IJ INJ
0.5000 mL | INJECTION | INTRAMUSCULAR | Status: AC
  Administered 2018-05-08: 0.5 mL via INTRAMUSCULAR
  Filled 2018-05-07: qty 0.5

## 2018-05-07 MED ORDER — SODIUM CHLORIDE 0.9% FLUSH
10.0000 mL | INTRAVENOUS | Status: DC | PRN
  Administered 2018-05-09 – 2018-05-17 (×8): 10 mL
  Administered 2018-05-18: 20 mL
  Filled 2018-05-07 (×9): qty 40

## 2018-05-07 NOTE — Progress Notes (Signed)
PROGRESS NOTE        PATIENT DETAILS Name: Jeffery Bennett Age: 37 y.o. Sex: male Date of Birth: [birth year] Admit Date: 04/29/2018 Admitting Physician Eduard Clos, MD NWG:[id number], No Pcp Per  Brief Narrative:  Patient is a 37 y.o. 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  Remains on empiric ampicillin and gentamicin-evaluated by thoracic surgery, with potential plans for aortic valve replacement and the next 1-2 weeks.  See below for further details  Subjective:  Patient in bed, appears comfortable, denies any headache, no fever, no chest pain or pressure, no shortness of breath , no abdominal pain. No focal weakness.   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  Orthopantogram also reveals some dental caries and periodontal disease for which I have called and requested dentist Dr. Robin Searing to evaluate the patient coming Tuesday, now seems to be afebrile and nonseptic.  TEE showed perforated aortic cusp with severe AR.  IV drug use: Acknowledged being heroin 3 weeks back to this MD yesterday, urine drug screen positive for cocaine as well.  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  It seems that he may be using all these illicit agents in spite of being on methadone as an outpatient.  Remains on methadone-EKG done earlier this morning showed normal QTC.  Agent was extensively counseled.  Chronic hepatitis C: Outpatient follow-up with infectious disease  Mild normocytic anemia: Mild-probably  secondary to acute illness-doubt any further work-up is required-apart from periodic CBC follow-up.   DVT Prophylaxis: Prophylactic Lovenox   Code Status: Full code   Family Communication: Mother at bedside  Disposition Plan: Remain inpatient-will likely remain inpatient for several weeks to complete IV antibiotics/and aortic valve replacement  Antimicrobial agents: Anti-infectives (From admission, onward)   Start     Dose/Rate Route Frequency Ordered Stop   04/30/18 1800  gentamicin (GARAMYCIN) IVPB 100 mg     100 mg 200 mL/hr over 30 Minutes Intravenous Every 12 hours 04/30/18 1204     04/30/18 1200  vancomycin (VANCOCIN) IVPB 1000 mg/200 mL premix  Status:  Discontinued     1,000 mg 200 mL/hr over 60 Minutes Intravenous Every 8 hours 04/30/18 0237 04/30/18 1154   04/30/18 1200  ampicillin (OMNIPEN) 2 g in sodium chloride 0.9 % 100 mL IVPB     2 g 300 mL/hr over 20 Minutes Intravenous Every 4 hours 04/30/18 1154     04/30/18 0245  vancomycin (VANCOCIN) 1,500 mg in sodium chloride 0.9 % 500 mL IVPB     1,500 mg 250 mL/hr over 120 Minutes Intravenous  Once 04/30/18 0237 04/30/18 0555      Procedures: 5/24>>TEE  CONSULTS:  ID and TCTS  Time spent: 25 minutes-Greater than 50% of this time was spent in counseling, explanation of diagnosis, planning of further management, and coordination of care.  MEDICATIONS: Scheduled Meds: . enoxaparin (LOVENOX) injection  40 mg Subcutaneous Q24H  . methadone  100 mg Oral Daily  . nicotine  21 mg Transdermal Daily  . [START ON 05/08/2018] pneumococcal 23 valent vaccine  0.5 mL Intramuscular Tomorrow-1000   Continuous Infusions: . ampicillin (OMNIPEN) IV Stopped (05/07/18 1006)  . gentamicin Stopped (05/07/18 0745)   PRN Meds:.acetaminophen **OR** acetaminophen, ondansetron **OR** ondansetron (ZOFRAN) IV   PHYSICAL EXAM: Vital signs: Vitals:   05/06/18 1551 05/06/18 2138 05/07/18 0500 05/07/18 0953  BP: (!) 102/44 (!) 102/49   (!) 104/49  Pulse: 74 75  77  Resp: 16 18  20   Temp: 98.1 F (36.7 C) 98.1 F (36.7 C)  97.9 F (36.6 C)  TempSrc: Oral Oral  Oral  SpO2: 100% 98%  98%  Weight:  78.9 kg (173 lb 15.2 oz) 78.9 kg (173 lb 15.1 oz)   Height:       Filed Weights   05/06/18 0500 05/06/18 2138 05/07/18 0500  Weight: 78.9 kg (173 lb 15.1 oz) 78.9 kg (173 lb 15.2 oz) 78.9 kg (173 lb 15.1 oz)   Body mass index is 25.69 kg/m.   Exam  Awake Alert, Oriented X 3, No new F.N deficits, Normal affect [place].AT,PERRAL Supple Neck,No JVD, No cervical lymphadenopathy appriciated.  Symmetrical Chest wall movement, Good air movement bilaterally, CTAB RRR,No Gallops, Rubs , No Parasternal Heave +ve B.Sounds, Abd Soft, No tenderness, No organomegaly appriciated, No rebound - guarding or rigidity. No Cyanosis, Clubbing or edema, No new Rash or bruise Severe continuous aortic valve murmur, Osler's node in the right second toe stable   I have personally reviewed following labs and imaging studies  LABORATORY DATA: CBC: Recent Labs  Lab 05/02/18 0335 05/03/18 0649 05/04/18 0659 05/05/18 0449 05/06/18 0604  WBC 9.0 7.8 8.8 8.6 8.6  HGB 10.3* 10.9* 11.0* 10.2* 10.4*  HCT 32.5* 35.2* 35.7* 32.7* 33.6*  MCV 82.3 82.8 82.8 82.0 83.0  PLT 278 336 358 355 340    Basic Metabolic Panel: Recent Labs  Lab 05/02/18 0335 05/03/18 0649 05/04/18 0659 05/05/18 0449 05/06/18 0604 05/07/18 0515  NA 138 140 138 138 138  --   K 4.2 4.0 4.0 4.0 3.9  --   CL 100* 99* 97* 97* 99*  --   CO2 28 33* 33* 31 31  --   GLUCOSE 113* 96 93 104* 106*  --   BUN 6 5* 8 9 9   --   CREATININE 0.88 0.91 1.09 1.08 1.13 1.18  CALCIUM 8.8* 9.0 9.1 9.1 8.8*  --   MG  --   --   --  2.0  --   --     GFR: Estimated Creatinine Clearance: 86.5 mL/min (by C-G formula based on SCr of 1.18 mg/dL).  Liver Function Tests: No results for input(s): AST, ALT, ALKPHOS, BILITOT, PROT, ALBUMIN in the last 168 hours. No results for input(s): LIPASE,  AMYLASE in the last 168 hours. No results for input(s): AMMONIA in the last 168 hours.  Coagulation Profile: No results for input(s): INR, PROTIME in the last 168 hours.  Cardiac Enzymes: No results for input(s): CKTOTAL, CKMB, CKMBINDEX, TROPONINI in the last 168 hours.  BNP (last 3 results) No results for input(s): PROBNP in the last 8760 hours.  HbA1C: No results for input(s): HGBA1C in the last 72 hours.  CBG: No results for input(s): GLUCAP in the last 168 hours.  Lipid Profile: No results for input(s): CHOL, HDL, LDLCALC, TRIG, CHOLHDL, LDLDIRECT in the last 72 hours.  Thyroid Function Tests: No results for input(s): TSH, T4TOTAL, FREET4, T3FREE,  THYROIDAB in the last 72 hours.  Anemia Panel: No results for input(s): VITAMINB12, FOLATE, FERRITIN, TIBC, IRON, RETICCTPCT in the last 72 hours.  Urine analysis:    Component Value Date/Time   COLORURINE YELLOW 04/30/2018 0024   APPEARANCEUR CLEAR 04/30/2018 0024   LABSPEC >1.046 (H) 04/30/2018 0024   PHURINE 5.0 04/30/2018 0024   GLUCOSEU NEGATIVE 04/30/2018 0024   HGBUR NEGATIVE 04/30/2018 0024   BILIRUBINUR NEGATIVE 04/30/2018 0024   KETONESUR NEGATIVE 04/30/2018 0024   PROTEINUR NEGATIVE 04/30/2018 0024   UROBILINOGEN 0.2 10/05/2009 1913   NITRITE NEGATIVE 04/30/2018 0024   LEUKOCYTESUR NEGATIVE 04/30/2018 0024    Sepsis Labs: Lactic Acid, Venous    Component Value Date/Time   LATICACIDVEN 0.73 11/10/2016 1321    MICROBIOLOGY: Recent Results (from the past 240 hour(s))  Blood culture (routine x 2)     Status: Abnormal   Collection Time: 04/29/18  6:46 PM  Result Value Ref Range Status   Specimen Description BLOOD BLOOD RIGHT FOREARM  Final   Special Requests   Final    BOTTLES DRAWN AEROBIC AND ANAEROBIC Blood Culture adequate volume   Culture  Setup Time   Final    GRAM POSITIVE COCCI IN CHAINS IN BOTH AEROBIC AND ANAEROBIC BOTTLES CRITICAL RESULT CALLED TO, READ BACK BY AND VERIFIED WITH: MARTIN  PHARMD AT 1136 ON 052019 BY SJW PT IN ED ADMITTED TO MC CALLED [facility] AND MC Performed at [facility], [address] [place], [place] [address]    Culture ENTEROCOCCUS FAECALIS (A)  Final   Report Status 05/02/2018 FINAL  Final   Organism ID, Bacteria ENTEROCOCCUS FAECALIS  Final      Susceptibility   Enterococcus faecalis - MIC*    AMPICILLIN <=2 SENSITIVE Sensitive     VANCOMYCIN 1 SENSITIVE Sensitive     GENTAMICIN SYNERGY SENSITIVE Sensitive     * ENTEROCOCCUS FAECALIS  Blood Culture ID Panel (Reflexed)     Status: Abnormal   Collection Time: 04/29/18  6:46 PM  Result Value Ref Range Status   Enterococcus species DETECTED (A) NOT DETECTED Final    Comment: CRITICAL RESULT CALLED TO, READ BACK BY AND VERIFIED WITH: KELLY CONNOR RN AT 1135 ON [id number] BY SJW    Vancomycin resistance NOT DETECTED NOT DETECTED Final   Listeria monocytogenes NOT DETECTED NOT DETECTED Final   Staphylococcus species NOT DETECTED NOT DETECTED Final   Staphylococcus aureus NOT DETECTED NOT DETECTED Final   Streptococcus species NOT DETECTED NOT DETECTED Final   Streptococcus agalactiae NOT DETECTED NOT DETECTED Final   Streptococcus pneumoniae NOT DETECTED NOT DETECTED Final   Streptococcus pyogenes NOT DETECTED NOT DETECTED Final   Acinetobacter baumannii NOT DETECTED NOT DETECTED Final   Enterobacteriaceae species NOT DETECTED NOT DETECTED Final   Enterobacter cloacae complex NOT DETECTED NOT DETECTED Final   Escherichia coli NOT DETECTED NOT DETECTED Final   Klebsiella oxytoca NOT DETECTED NOT DETECTED Final   Klebsiella pneumoniae NOT DETECTED NOT DETECTED Final   Proteus species NOT DETECTED NOT DETECTED Final   Serratia marcescens NOT DETECTED NOT DETECTED Final   Haemophilus influenzae NOT DETECTED NOT DETECTED Final   Neisseria meningitidis NOT DETECTED NOT DETECTED Final   Pseudomonas aeruginosa NOT DETECTED NOT DETECTED Final   Candida albicans NOT DETECTED NOT DETECTED Final    Candida glabrata NOT DETECTED NOT DETECTED Final   Candida krusei NOT DETECTED NOT DETECTED Final   Candida parapsilosis NOT DETECTED NOT DETECTED Final   Candida tropicalis NOT DETECTED NOT  DETECTED Final    Comment: Performed at [facility], [address] [place], [place] [address]  Culture, blood (routine x 2)     Status: None   Collection Time: 05/01/18  9:27 PM  Result Value Ref Range Status   Specimen Description BLOOD LEFT ANTECUBITAL  Final   Special Requests   Final    BOTTLES DRAWN AEROBIC AND ANAEROBIC Blood Culture adequate volume   Culture   Final    NO GROWTH 5 DAYS Performed at [facility], [address] [place], [place] [address]    Report Status 05/06/2018 FINAL  Final  Culture, blood (routine x 2)     Status: None   Collection Time: 05/01/18  9:32 PM  Result Value Ref Range Status   Specimen Description BLOOD LEFT FOREARM  Final   Special Requests   Final    BOTTLES DRAWN AEROBIC AND ANAEROBIC Blood Culture adequate volume   Culture   Final    NO GROWTH 5 DAYS Performed at [facility], [address] [place], [place] [address]    Report Status 05/06/2018 FINAL  Final    RADIOLOGY STUDIES/RESULTS: Dg Orthopantogram  Result Date: 05/04/2018 CLINICAL DATA:  Bottom right and top left tooth pain. EXAM: ORTHOPANTOGRAM/PANORAMIC COMPARISON:  None. FINDINGS: Dental caries of the right lower first and third molars are suggested characterized by tooth deformity and lucencies. Periapical lucencies consistent with diffuse periodontal disease more notably about the right and left canines and bilateral upper lateral incisors. Dental caries of the left upper remaining molar. IMPRESSION: Dental caries as above with scattered areas of periapical lucencies compatible with periodontal disease. Electronically Signed   By: Tollie Eth M.D.   On: 05/04/2018 22:44   Dg Chest 2 View  Result Date: 04/29/2018 CLINICAL DATA:  Patient with left shoulder pain. EXAM:  CHEST - 2 VIEW COMPARISON:  Chest radiograph 01/21/2016. FINDINGS: Stable cardiac and mediastinal contours. Heterogeneous opacities right lung base. No pleural effusion or pneumothorax. Thoracic spine degenerative changes. Surgical hardware left humerus. IMPRESSION: Heterogeneous opacities right lung base may represent scarring. Infection not excluded. Electronically Signed   By: Annia Belt M.D.   On: 04/29/2018 17:20   Ct Angio Chest Pe W/cm &/or Wo Cm  Result Date: 04/29/2018 CLINICAL DATA:  Dyspnea. Left-sided chest/shoulder pain. High pretest probability for pulmonary embolus. EXAM: CT ANGIOGRAPHY CHEST WITH CONTRAST TECHNIQUE: Multidetector CT imaging of the chest was performed using the standard protocol during bolus administration of intravenous contrast. Multiplanar CT image reconstructions and MIPs were obtained to evaluate the vascular anatomy. CONTRAST:  ISOVUE-370 IOPAMIDOL (ISOVUE-370) INJECTION 76% COMPARISON:  Radiographs 04/29/2018.  Chest CT 01/14/2016 FINDINGS: Cardiovascular: There are no filling defects within the pulmonary arteries to suggest pulmonary embolus. Thoracic aorta is normal in caliber without dissection or aneurysm. The heart is normal in size. No pericardial effusion. Mediastinum/Nodes: Prominent right lower hilar lymph node measures 10 mm short axis, may be reactive. No mediastinal adenopathy. The esophagus is decompressed. No visualized thyroid nodule. Lungs/Pleura: Slight heterogeneous attenuation of lung parenchyma. Areas of bronchial filling in the right lower lobe subsegmental branches, with small rounded intraluminal densities within the right middle lobe bronchus, right and left lower lobe bronchus. Bronchial thickening in the lower lobe bronchi with scattered mucous plugging. Heterogeneous densities in the dependent lower lobes may be atelectasis or infectious. No pulmonary edema. No significant pleural fluid. Upper Abdomen: No acute abnormality. Musculoskeletal:  Remote left rib fractures. Remote proximal left humerus fracture  post ORIF. There are no acute or suspicious osseous abnormalities. Review of the MIP images confirms the above findings. IMPRESSION: 1. No pulmonary embolus. 2. Heterogeneous lung parenchyma can be seen in the setting of small airways disease. Bronchial thickening in the lower lobes, with areas of mucous plugging. Small rounded intraluminal densities in the right middle, right and left lower lobe bronchi, may be retained secretions or aspiration in the appropriate clinical setting. 3. Dependent lower lobe densities may be atelectasis or infectious pneumonitis, possibly related to bronchial thickening and mucous plugging. Electronically Signed   By: Rubye Oaks M.D.   On: 04/29/2018 22:30   Dg Foot 2 Views Right  Result Date: 04/29/2018 CLINICAL DATA:  Right foot pain EXAM: RIGHT FOOT - 2 VIEW COMPARISON:  None. FINDINGS: No fracture or malalignment. Joint spaces are maintained. Soft tissues are unremarkable. IMPRESSION: Negative. Electronically Signed   By: Jasmine Pang M.D.   On: 04/29/2018 19:41   [place] Ekg Site Rite  Result Date: 05/07/2018 If Site Rite image not attached, placement could not be confirmed due to current cardiac rhythm.    LOS: 7 days   Signature  Susa Raring M.D on 05/07/2018 at 12:00 PM  Between 7am to 7pm - Pager - [phone] ( page via [email or web address], text pages only, please mention full 10 digit call back number).  After 7pm go to [email or web address] - password [email or web address]

## 2018-05-07 NOTE — Consult Note (Signed)
[facility]  Date of Admission:  04/29/2018   Total days of antibiotics 8         ASSESSMENT: Jeffery Bennett is a 37 yo male with history IV heroin use and chronic Hep C with enterococcal native AV endocarditis complicated by septic emboli to lungs and extremities. Blood cultures from 5/21 negative. Currently on ampicillin and gentamicin. Renal function remains normal. Plan for 6 weeks of IV antibiotic therapy.   PLAN: 1. Continue ampicillin and gentamicin. Will need long term treatment for 6 weeks. PICC line ordered.  2. Dentist evaluation plan for 5/28 3. May benefit from increase in methadone dose given poor pain control   Active Problems:   Polysubstance abuse (HCC)   Hepatitis C virus infection without hepatic coma   Chest pain   Normochromic normocytic anemia   Pneumonitis   Endocarditis   Scheduled Meds: . enoxaparin (LOVENOX) injection  40 mg Subcutaneous Q24H  . methadone  100 mg Oral Daily  . nicotine  21 mg Transdermal Daily  . [START ON 05/08/2018] pneumococcal 23 valent vaccine  0.5 mL Intramuscular Tomorrow-1000   Continuous Infusions: . ampicillin (OMNIPEN) IV 2 g (05/07/18 0946)  . gentamicin Stopped (05/07/18 0745)   PRN Meds:.acetaminophen **OR** acetaminophen, ondansetron **OR** ondansetron (ZOFRAN) IV   SUBJECTIVE: No acute events overnight. Afebrile. Doing well. Reports no complaints this AM. Denies chest pain, shortness of breath, abdominal pain, and diarrhea. Mild pain on lateral R foot.   Review of Systems: Review of Systems  Constitutional: Negative for chills and fever.  Respiratory: Negative for cough and shortness of breath.   Cardiovascular: Negative for chest pain and palpitations.  Gastrointestinal: Negative for abdominal pain and diarrhea.  Musculoskeletal: Negative for myalgias.    No Known Allergies  OBJECTIVE: Vitals:   05/06/18 1551 05/06/18 2138 05/07/18 0500 05/07/18 0953  BP: (!)  102/44 (!) 102/49  (!) 104/49  Pulse: 74 75  77  Resp: Temp: 98.1 F (36.7 C) 98.1 F (36.7 C)  97.9 F (36.6 C)  TempSrc: Oral Oral  Oral  SpO2: 100% 98%  98%  Weight:  173 lb 15.2 oz (78.9 kg) 173 lb 15.1 oz (78.9 kg)   Height:       Body mass index is 25.69 kg/m.  Physical Exam  Constitutional: He is oriented to person, place, and time. He appears well-developed and well-nourished. No distress.  HENT:  Head: Normocephalic and atraumatic.  Mouth/Throat: Oropharynx is clear and moist.  Neck: Normal range of motion. Neck supple.  Cardiovascular:  Murmur (III/VI diastolic murmur heard throughout) heard. Pulmonary/Chest: Breath sounds normal. No respiratory distress.  Abdominal: Soft. Bowel sounds are normal. There is no tenderness.  Musculoskeletal: He exhibits tenderness (R foot tenderness over lateral aspect ).  Neurological: He is alert and oriented to person, place, and time.    Lab Results Lab Results  Component Value Date   WBC 8.6 05/06/2018   HGB 10.4 (L) 05/06/2018   HCT 33.6 (L) 05/06/2018   MCV 83.0 05/06/2018   PLT 340 05/06/2018    Lab Results  Component Value Date   CREATININE 1.18 05/07/2018   BUN 9 05/06/2018   NA 138 05/06/2018   K 3.9 05/06/2018   CL 99 (L) 05/06/2018   CO2 31 05/06/2018    Lab Results  Component Value Date   ALT 10 (L) 04/30/2018   AST 12 (L) 04/30/2018   ALKPHOS  66 04/30/2018   BILITOT 0.3 04/30/2018     Microbiology: Recent Results (from the past 240 hour(s))  Blood culture (routine x 2)     Status: Abnormal   Collection Time: 04/29/18  6:46 PM  Result Value Ref Range Status   Specimen Description BLOOD BLOOD RIGHT FOREARM  Final   Special Requests   Final    BOTTLES DRAWN AEROBIC AND ANAEROBIC Blood Culture adequate volume   Culture  Setup Time   Final    GRAM POSITIVE COCCI IN CHAINS IN BOTH AEROBIC AND ANAEROBIC BOTTLES CRITICAL RESULT CALLED TO, READ BACK BY AND VERIFIED WITH: MARTIN PHARMD AT 1136  ON 052019 BY SJW PT IN ED ADMITTED TO MC CALLED [facility] AND MC Performed at [facility], [address] [place], [place] [address]    Culture ENTEROCOCCUS FAECALIS (A)  Final   Report Status 05/02/2018 FINAL  Final   Organism ID, Bacteria ENTEROCOCCUS FAECALIS  Final      Susceptibility   Enterococcus faecalis - MIC*    AMPICILLIN <=2 SENSITIVE Sensitive     VANCOMYCIN 1 SENSITIVE Sensitive     GENTAMICIN SYNERGY SENSITIVE Sensitive     * ENTEROCOCCUS FAECALIS  Blood Culture ID Panel (Reflexed)     Status: Abnormal   Collection Time: 04/29/18  6:46 PM  Result Value Ref Range Status   Enterococcus species DETECTED (A) NOT DETECTED Final    Comment: CRITICAL RESULT CALLED TO, READ BACK BY AND VERIFIED WITH: KELLY CONNOR RN AT 1135 ON [id number] BY SJW    Vancomycin resistance NOT DETECTED NOT DETECTED Final   Listeria monocytogenes NOT DETECTED NOT DETECTED Final   Staphylococcus species NOT DETECTED NOT DETECTED Final   Staphylococcus aureus NOT DETECTED NOT DETECTED Final   Streptococcus species NOT DETECTED NOT DETECTED Final   Streptococcus agalactiae NOT DETECTED NOT DETECTED Final   Streptococcus pneumoniae NOT DETECTED NOT DETECTED Final   Streptococcus pyogenes NOT DETECTED NOT DETECTED Final   Acinetobacter baumannii NOT DETECTED NOT DETECTED Final   Enterobacteriaceae species NOT DETECTED NOT DETECTED Final   Enterobacter cloacae complex NOT DETECTED NOT DETECTED Final   Escherichia coli NOT DETECTED NOT DETECTED Final   Klebsiella oxytoca NOT DETECTED NOT DETECTED Final   Klebsiella pneumoniae NOT DETECTED NOT DETECTED Final   Proteus species NOT DETECTED NOT DETECTED Final   Serratia marcescens NOT DETECTED NOT DETECTED Final   Haemophilus influenzae NOT DETECTED NOT DETECTED Final   Neisseria meningitidis NOT DETECTED NOT DETECTED Final   Pseudomonas aeruginosa NOT DETECTED NOT DETECTED Final   Candida albicans NOT DETECTED NOT DETECTED Final   Candida glabrata NOT  DETECTED NOT DETECTED Final   Candida krusei NOT DETECTED NOT DETECTED Final   Candida parapsilosis NOT DETECTED NOT DETECTED Final   Candida tropicalis NOT DETECTED NOT DETECTED Final    Comment: Performed at [facility], [address], [place], [place] [address]  Culture, blood (routine x 2)     Status: None   Collection Time: 05/01/18  9:27 PM  Result Value Ref Range Status   Specimen Description BLOOD LEFT ANTECUBITAL  Final   Special Requests   Final    BOTTLES DRAWN AEROBIC AND ANAEROBIC Blood Culture adequate volume   Culture   Final    NO GROWTH 5 DAYS Performed at [facility], [address], [place], [place] [address]    Report Status 05/06/2018 FINAL  Final  Culture, blood (routine x 2)  Status: None   Collection Time: 05/01/18  9:32 PM  Result Value Ref Range Status   Specimen Description BLOOD LEFT FOREARM  Final   Special Requests   Final    BOTTLES DRAWN AEROBIC AND ANAEROBIC Blood Culture adequate volume   Culture   Final    NO GROWTH 5 DAYS Performed at [facility], [address], [place], [place] [address]    Report Status 05/06/2018 FINAL  Final    Burna Cash, MD [facility] [phone] pager   [phone] cell 05/07/2018, 10:24 AM

## 2018-05-07 NOTE — Progress Notes (Addendum)
Notified Thedore Mins Singh that pt had a 14 beat run of wide QRS SVT. Pt currently in NSR at 73 bpm. Pt is asymptomatic. Pt was in bed resting when checked on. Will continue to monitor.    Leonia Reeves, RN

## 2018-05-08 ENCOUNTER — Other Ambulatory Visit: Payer: Self-pay | Admitting: *Deleted

## 2018-05-08 ENCOUNTER — Encounter (HOSPITAL_COMMUNITY): Payer: Self-pay | Admitting: Dentistry

## 2018-05-08 DIAGNOSIS — K047 Periapical abscess without sinus: Secondary | ICD-10-CM

## 2018-05-08 DIAGNOSIS — I2510 Atherosclerotic heart disease of native coronary artery without angina pectoris: Secondary | ICD-10-CM

## 2018-05-08 DIAGNOSIS — I351 Nonrheumatic aortic (valve) insufficiency: Secondary | ICD-10-CM

## 2018-05-08 DIAGNOSIS — Z95828 Presence of other vascular implants and grafts: Secondary | ICD-10-CM

## 2018-05-08 LAB — CREATININE, SERUM
Creatinine, Ser: 1.23 mg/dL (ref 0.61–1.24)
GFR calc Af Amer: >60 mL/min (ref >60)
GFR calc non Af Amer: >60 mL/min (ref >60)

## 2018-05-08 LAB — GENTAMICIN LEVEL, TROUGH: Gentamicin Trough: 0.6 ug/mL (ref 0.5–2.0)

## 2018-05-08 NOTE — Progress Notes (Signed)
PROGRESS NOTE        PATIENT DETAILS Name: Jeffery Bennett Age: 37 y.o. Sex: male Date of Birth: 1981/05/02 Admit Date: 04/29/2018 Admitting Physician Eduard Clos, MD IRW:[id number], No Pcp Per  Brief Narrative:  Patient is a 37 y.o. 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  Remains on empiric ampicillin and gentamicin-evaluated by thoracic surgery, with potential plans for aortic valve replacement and the next 1-2 weeks.  See below for further details  Subjective:  Patient in bed, appears comfortable, denies any headache, no fever, no chest pain or pressure, no shortness of breath , no abdominal pain. No focal weakness.   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 Orthopantogram also reveals some dental caries and periodontal disease for which I have called and requested dentist Dr. Robin Searing to evaluate the patient on Saturday but have not heard back ( ? In town, called again today no answer), today called on call Dentist Dr Synetta Shadow -they will get the patient seen today either by Dr. Synetta Shadow or by Dr. Valentino Hue they will try to contact Dr. Valentino Hue by themselves as well.  IV drug use: Acknowledged being heroin 3 weeks back to this MD yesterday, urine drug screen positive for cocaine as well.  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  It seems that he may be using all these illicit agents in spite of being on methadone as an outpatient.  Remains on methadone- EKG done earlier this morning showed  normal QTC.  He was counseled.   SVT x 1, 16 beat on 05-07-18 -   had a short run of SVT on 05/07/2018 but was asymptomatic, repeat EKG largely unchanged.  Will continue to monitor clinically.  Blood pressure too low for beta-blockers.  Electrolyte stable will continue to monitor from time to time.  Chronic hepatitis C: Outpatient follow-up with infectious disease  Mild normocytic anemia: Mild-probably secondary to acute illness-doubt any further work-up is required-apart from periodic CBC follow-up.   DVT Prophylaxis: Prophylactic Lovenox   Code Status: Full code   Family Communication: Mother at bedside  Disposition Plan: Remain inpatient-will likely remain inpatient for several weeks to complete IV antibiotics/and aortic valve replacement  Antimicrobial agents: Anti-infectives (From admission, onward)   Start     Dose/Rate Route Frequency Ordered Stop   04/30/18 1800  gentamicin (GARAMYCIN) IVPB 100 mg     100 mg 200 mL/hr over 30 Minutes Intravenous Every 12 hours 04/30/18 1204     04/30/18 1200  vancomycin (VANCOCIN) IVPB 1000 mg/200 mL premix  Status:  Discontinued     1,000 mg 200 mL/hr over 60 Minutes Intravenous Every 8 hours 04/30/18 0237 04/30/18 1154   04/30/18 1200  ampicillin (OMNIPEN) 2 g in sodium chloride 0.9 % 100 mL IVPB     2 g 300 mL/hr over 20 Minutes Intravenous Every 4 hours 04/30/18 1154     04/30/18 0245  vancomycin (VANCOCIN) 1,500  mg in sodium chloride 0.9 % 500 mL IVPB     1,500 mg 250 mL/hr over 120 Minutes Intravenous  Once 04/30/18 0237 04/30/18 0555      Procedures: 5/24>>TEE  CONSULTS:  ID and TCTS, dental surgery  Time spent: 25 minutes-Greater than 50% of this time was spent in counseling, explanation of diagnosis, planning of further management, and coordination of care.  MEDICATIONS: Scheduled Meds: . enoxaparin (LOVENOX) injection  40 mg Subcutaneous Q24H  . methadone  100 mg Oral Daily  . nicotine  21 mg Transdermal Daily  .  pneumococcal 23 valent vaccine  0.5 mL Intramuscular Tomorrow-1000   Continuous Infusions: . ampicillin (OMNIPEN) IV 2 g (05/08/18 0803)  . gentamicin Stopped (05/08/18 0526)   PRN Meds:.acetaminophen **OR** acetaminophen, ondansetron **OR** ondansetron (ZOFRAN) IV, sodium chloride flush   PHYSICAL EXAM: Vital signs: Vitals:   05/07/18 1723 05/07/18 2117 05/08/18 0459 05/08/18 0924  BP: (!) 112/57 (!) 112/55 (!) 106/55 (!) 108/55  Pulse: 84 70 83 79  Resp: Temp: 98.2 F (36.8 C) 97.8 F (36.6 C) 98.1 F (36.7 C) 98 F (36.7 C)  TempSrc: Oral Oral Oral Oral  SpO2: 97% 99% 98% 98%  Weight:  78.7 kg (173 lb 8 oz)    Height:       Filed Weights   05/06/18 2138 05/07/18 0500 05/07/18 2117  Weight: 78.9 kg (173 lb 15.2 oz) 78.9 kg (173 lb 15.1 oz) 78.7 kg (173 lb 8 oz)   Body mass index is 25.62 kg/m.   Exam  Awake Alert, Oriented X 3, No new F.N deficits, Normal affect [place].AT,PERRAL Supple Neck,No JVD, No cervical lymphadenopathy appriciated.  Symmetrical Chest wall movement, Good air movement bilaterally, CTAB RRR,No Gallops, Rubs or new Murmurs, No Parasternal Heave +ve B.Sounds, Abd Soft, No tenderness, No organomegaly appriciated, No rebound - guarding or rigidity. No Cyanosis, Clubbing or edema, No new Rash or bruise Severe continuous aortic valve murmur, Osler's node in the right second toe stable   I have personally reviewed following labs and imaging studies  LABORATORY DATA: CBC: Recent Labs  Lab 05/02/18 0335 05/03/18 0649 05/04/18 0659 05/05/18 0449 05/06/18 0604  WBC 9.0 7.8 8.8 8.6 8.6  HGB 10.3* 10.9* 11.0* 10.2* 10.4*  HCT 32.5* 35.2* 35.7* 32.7* 33.6*  MCV 82.3 82.8 82.8 82.0 83.0  PLT 278 336 358 355 340    Basic Metabolic Panel: Recent Labs  Lab 05/03/18 0649 05/04/18 0659 05/05/18 0449 05/06/18 0604 05/07/18 0515 05/07/18 1747  NA 140 138 138 138  --  139  K 4.0 4.0 4.0 3.9  --  4.0  CL 99* 97* 97* 99*  --  100*  CO2  33* 33* 31 31  --  30  GLUCOSE 96 93 104* 106*  --  110*  BUN 5* --  10  CREATININE 0.91 1.09 1.08 1.13 1.18 1.15  CALCIUM 9.0 9.1 9.1 8.8*  --  9.0  MG  --   --  2.0  --   --  2.1    GFR: Estimated Creatinine Clearance: 88.8 mL/min (by C-G formula based on SCr of 1.15 mg/dL).  Liver Function Tests: No results for input(s): AST, ALT, ALKPHOS, BILITOT, PROT, ALBUMIN in the last 168 hours. No results for input(s): LIPASE, AMYLASE in the last 168 hours. No results for input(s): AMMONIA in the last 168 hours.  Coagulation Profile: No results for input(s): INR, PROTIME in the last 168 hours.  Cardiac Enzymes: No results for input(s): CKTOTAL, CKMB, CKMBINDEX, TROPONINI in the last 168 hours.  BNP (last 3 results) No results for input(s): PROBNP in the last 8760 hours.  HbA1C: No results for input(s): HGBA1C in the last 72 hours.  CBG: No results for input(s): GLUCAP in the last 168 hours.  Lipid Profile: No results for input(s): CHOL, HDL, LDLCALC, TRIG, CHOLHDL, LDLDIRECT in the last 72 hours.  Thyroid Function Tests: No results for input(s): TSH, T4TOTAL, FREET4, T3FREE, THYROIDAB in the last 72 hours.  Anemia Panel: No results for input(s): VITAMINB12, FOLATE, FERRITIN, TIBC, IRON, RETICCTPCT in the last 72 hours.  Urine analysis:    Component Value Date/Time   COLORURINE YELLOW 04/30/2018 0024   APPEARANCEUR CLEAR 04/30/2018 0024   LABSPEC >1.046 (H) 04/30/2018 0024   PHURINE 5.0 04/30/2018 0024   GLUCOSEU NEGATIVE 04/30/2018 0024   HGBUR NEGATIVE 04/30/2018 0024   BILIRUBINUR NEGATIVE 04/30/2018 0024   KETONESUR NEGATIVE 04/30/2018 0024   PROTEINUR NEGATIVE 04/30/2018 0024   UROBILINOGEN 0.2 10/05/2009 1913   NITRITE NEGATIVE 04/30/2018 0024   LEUKOCYTESUR NEGATIVE 04/30/2018 0024    Sepsis Labs: Lactic Acid, Venous    Component Value Date/Time   LATICACIDVEN 0.73 11/10/2016 1321    MICROBIOLOGY: Recent Results (from the past 240 hour(s))    Blood culture (routine x 2)     Status: Abnormal   Collection Time: 04/29/18  6:46 PM  Result Value Ref Range Status   Specimen Description BLOOD BLOOD RIGHT FOREARM  Final   Special Requests   Final    BOTTLES DRAWN AEROBIC AND ANAEROBIC Blood Culture adequate volume   Culture  Setup Time   Final    GRAM POSITIVE COCCI IN CHAINS IN BOTH AEROBIC AND ANAEROBIC BOTTLES CRITICAL RESULT CALLED TO, READ BACK BY AND VERIFIED WITH: MARTIN PHARMD AT 1136 ON 052019 BY SJW PT IN ED ADMITTED TO [facility] CALLED [facility] AND [facility] Performed at [facility], [address], [place], [place] [address]    Culture ENTEROCOCCUS FAECALIS (A)  Final   Report Status 05/02/2018 FINAL  Final   Organism ID, Bacteria ENTEROCOCCUS FAECALIS  Final      Susceptibility   Enterococcus faecalis - MIC*    AMPICILLIN <=2 SENSITIVE Sensitive     VANCOMYCIN 1 SENSITIVE Sensitive     GENTAMICIN SYNERGY SENSITIVE Sensitive     * ENTEROCOCCUS FAECALIS  Blood Culture ID Panel (Reflexed)     Status: Abnormal   Collection Time: 04/29/18  6:46 PM  Result Value Ref Range Status   Enterococcus species DETECTED (A) NOT DETECTED Final    Comment: CRITICAL RESULT CALLED TO, READ BACK BY AND VERIFIED WITH: KELLY CONNOR RN AT 1135 ON [id number] BY SJW    Vancomycin resistance NOT DETECTED NOT DETECTED Final   Listeria monocytogenes NOT DETECTED NOT DETECTED Final   Staphylococcus species NOT DETECTED NOT DETECTED Final   Staphylococcus aureus NOT DETECTED NOT DETECTED Final   Streptococcus species NOT DETECTED NOT DETECTED Final   Streptococcus agalactiae NOT DETECTED NOT DETECTED Final   Streptococcus pneumoniae NOT DETECTED NOT DETECTED Final   Streptococcus pyogenes NOT DETECTED NOT DETECTED Final   Acinetobacter baumannii NOT DETECTED NOT DETECTED Final   Enterobacteriaceae species NOT DETECTED NOT DETECTED Final   Enterobacter cloacae complex NOT DETECTED NOT DETECTED Final   Escherichia coli NOT DETECTED NOT DETECTED Final    Klebsiella oxytoca NOT DETECTED NOT DETECTED Final   Klebsiella pneumoniae NOT DETECTED NOT DETECTED Final   Proteus  species NOT DETECTED NOT DETECTED Final   Serratia marcescens NOT DETECTED NOT DETECTED Final   Haemophilus influenzae NOT DETECTED NOT DETECTED Final   Neisseria meningitidis NOT DETECTED NOT DETECTED Final   Pseudomonas aeruginosa NOT DETECTED NOT DETECTED Final   Candida albicans NOT DETECTED NOT DETECTED Final   Candida glabrata NOT DETECTED NOT DETECTED Final   Candida krusei NOT DETECTED NOT DETECTED Final   Candida parapsilosis NOT DETECTED NOT DETECTED Final   Candida tropicalis NOT DETECTED NOT DETECTED Final    Comment: Performed at [facility], [address] [place], [place] [address]  Culture, blood (routine x 2)     Status: None   Collection Time: 05/01/18  9:27 PM  Result Value Ref Range Status   Specimen Description BLOOD LEFT ANTECUBITAL  Final   Special Requests   Final    BOTTLES DRAWN AEROBIC AND ANAEROBIC Blood Culture adequate volume   Culture   Final    NO GROWTH 5 DAYS Performed at [facility], [address], [place], [place] [address]    Report Status 05/06/2018 FINAL  Final  Culture, blood (routine x 2)     Status: None   Collection Time: 05/01/18  9:32 PM  Result Value Ref Range Status   Specimen Description BLOOD LEFT FOREARM  Final   Special Requests   Final    BOTTLES DRAWN AEROBIC AND ANAEROBIC Blood Culture adequate volume   Culture   Final    NO GROWTH 5 DAYS Performed at [facility], [address], [place], [place] [address]    Report Status 05/06/2018 FINAL  Final    RADIOLOGY STUDIES/RESULTS: Dg Orthopantogram  Result Date: 05/04/2018 CLINICAL DATA:  Bottom right and top left tooth pain. EXAM: ORTHOPANTOGRAM/PANORAMIC COMPARISON:  None. FINDINGS: Dental caries of the right lower first and third molars are suggested characterized by tooth deformity and lucencies. Periapical lucencies consistent with  diffuse periodontal disease more notably about the right and left canines and bilateral upper lateral incisors. Dental caries of the left upper remaining molar. IMPRESSION: Dental caries as above with scattered areas of periapical lucencies compatible with periodontal disease. Electronically Signed   By: Tollie Eth M.D.   On: 05/04/2018 22:44   Dg Chest 2 View  Result Date: 04/29/2018 CLINICAL DATA:  Patient with left shoulder pain. EXAM: CHEST - 2 VIEW COMPARISON:  Chest radiograph 01/21/2016. FINDINGS: Stable cardiac and mediastinal contours. Heterogeneous opacities right lung base. No pleural effusion or pneumothorax. Thoracic spine degenerative changes. Surgical hardware left humerus. IMPRESSION: Heterogeneous opacities right lung base may represent scarring. Infection not excluded. Electronically Signed   By: Annia Belt M.D.   On: 04/29/2018 17:20   Ct Angio Chest Pe W/cm &/or Wo Cm  Result Date: 04/29/2018 CLINICAL DATA:  Dyspnea. Left-sided chest/shoulder pain. High pretest probability for pulmonary embolus. EXAM: CT ANGIOGRAPHY CHEST WITH CONTRAST TECHNIQUE: Multidetector CT imaging of the chest was performed using the standard protocol during bolus administration of intravenous contrast. Multiplanar CT image reconstructions and MIPs were obtained to evaluate the vascular anatomy. CONTRAST:  ISOVUE-370 IOPAMIDOL (ISOVUE-370) INJECTION 76% COMPARISON:  Radiographs 04/29/2018.  Chest CT 01/14/2016 FINDINGS: Cardiovascular: There are no filling defects within the pulmonary arteries to suggest pulmonary embolus. Thoracic aorta is normal in caliber without dissection or aneurysm. The heart is normal in size. No pericardial effusion. Mediastinum/Nodes: Prominent right lower hilar lymph node measures 10 mm short axis, may be reactive. No mediastinal adenopathy. The esophagus is decompressed.  No visualized thyroid nodule. Lungs/Pleura: Slight heterogeneous attenuation of lung parenchyma. Areas of  bronchial filling in the right lower lobe subsegmental branches, with small rounded intraluminal densities within the right middle lobe bronchus, right and left lower lobe bronchus. Bronchial thickening in the lower lobe bronchi with scattered mucous plugging. Heterogeneous densities in the dependent lower lobes may be atelectasis or infectious. No pulmonary edema. No significant pleural fluid. Upper Abdomen: No acute abnormality. Musculoskeletal: Remote left rib fractures. Remote proximal left humerus fracture post ORIF. There are no acute or suspicious osseous abnormalities. Review of the MIP images confirms the above findings. IMPRESSION: 1. No pulmonary embolus. 2. Heterogeneous lung parenchyma can be seen in the setting of small airways disease. Bronchial thickening in the lower lobes, with areas of mucous plugging. Small rounded intraluminal densities in the right middle, right and left lower lobe bronchi, may be retained secretions or aspiration in the appropriate clinical setting. 3. Dependent lower lobe densities may be atelectasis or infectious pneumonitis, possibly related to bronchial thickening and mucous plugging. Electronically Signed   By: Rubye Oaks M.D.   On: 04/29/2018 22:30   Dg Foot 2 Views Right  Result Date: 04/29/2018 CLINICAL DATA:  Right foot pain EXAM: RIGHT FOOT - 2 VIEW COMPARISON:  None. FINDINGS: No fracture or malalignment. Joint spaces are maintained. Soft tissues are unremarkable. IMPRESSION: Negative. Electronically Signed   By: Jasmine Pang M.D.   On: 04/29/2018 19:41   [place] Ekg Site Rite  Result Date: 05/07/2018 If Site Rite image not attached, placement could not be confirmed due to current cardiac rhythm.    LOS: 8 days   Signature  Susa Raring M.D on 05/08/2018 at 10:12 AM  Between 7am to 7pm - Pager - [phone] ( page via [email or web address], text pages only, please mention full 10 digit call back number).  After 7pm go to [email or web address] - password [email or web address]

## 2018-05-08 NOTE — Progress Notes (Signed)
    [facility]    Date of Admission:  04/29/2018   Total days of antibiotics 10          ID: Jeffery Bennett is a 37 y.o. male with  Native AV enterococcal endocarditis Active Problems:   Polysubstance abuse (HCC)   Hepatitis C virus infection without hepatic coma   Chest pain   Normochromic normocytic anemia   Pneumonitis   Endocarditis    Subjective: Denies fever, chills, shortness of breath. Evaluated by dr Kristin Bruins who is planning for dental extraction nextweek. Patient also had picc line placed  Medications:  . enoxaparin (LOVENOX) injection  40 mg Subcutaneous Q24H  . methadone  100 mg Oral Daily  . nicotine  21 mg Transdermal Daily    Objective: Vital signs in last 24 hours: Temp:  [97.8 F (36.6 C)-98.1 F (36.7 C)] 97.8 F (36.6 C) (05/28 1608) Pulse Rate:  [70-83] 71 (05/28 1608) Resp:  [12-18] 18 (05/28 1608) BP: (106-112)/(53-55) 106/53 (05/28 1608) SpO2:  [98 %-99 %] 99 % (05/28 1608) Weight:  [173 lb 8 oz (78.7 kg)] 173 lb 8 oz (78.7 kg) (05/27 2117) Physical Exam  Constitutional: He is oriented to person, place, and time. He appears well-developed and well-nourished. No distress.  HENT:  Mouth/Throat: Oropharynx is clear and moist. No oropharyngeal exudate.  Cardiovascular: Normal rate, regular rhythm and normal heart sounds. +systolic murmur Pulmonary/Chest: Effort normal and breath sounds normal. No respiratory distress. He has no wheezes.  Neurological: He is alert and oriented to person, place, and time.  Skin: Skin is warm and dry. No rash noted. No erythema.  Psychiatric: He has a normal mood and affect. His behavior is normal.     Lab Results Recent Labs    05/06/18 0604  05/07/18 1747 05/08/18 1810  WBC 8.6  --   --   --   HGB 10.4*  --   --   --   HCT 33.6*  --   --   --   NA 138  --  139  --   K 3.9  --  4.0  --   CL 99*  --  100*  --   CO2 31  --  30  --   BUN 9  --  10  --   CREATININE 1.13   < >  1.15 1.23   < > = values in this interval not displayed.    Microbiology:  Studies/Results: [place] Ekg Site Rite  Result Date: 05/07/2018 If Site Rite image not attached, placement could not be confirmed due to current cardiac rhythm.    Assessment/Plan: Enterococcal endocarditis = continue with amp and gent.  Therapeutic drug monitoring /side effects = slight increase in creatinine. If increase to greater than 1.5, would consider increasing additional fluid bolus post. 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  [facility] Cell: [phone] Pager: [phone]  05/08/2018, 8:12 PM
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
## 2018-05-08 NOTE — Progress Notes (Unsigned)
pfts

## 2018-05-08 NOTE — Progress Notes (Signed)
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   SCr continues to slowly increase- 1.23 this evening. A gentamicin trough is still in appropriate range at 0.105mcg/mL.  Plan: Ampicillin 2g IV q4h Gentamicin  IV q12h for synergy dosing Plan for at least weekly GT to ensure staying <1 for synergy- watching renal function/UOP closely   Height:  (175.3 cm) Weight: 173 lb 8 oz (78.7 kg) IBW/kg (Calculated) : 70.7  Temp (24hrs), Avg:98 F (36.7 C), Min:97.8 F (36.6 C), Max:98.1 F (36.7 C)  Recent Labs  Lab 05/02/18 0335 05/02/18 1708 05/03/18 0649 05/04/18 0659 05/05/18 0449 05/06/18 0604 05/07/18 0515 05/07/18 1747 05/08/18 1810  WBC 9.0  --  7.8 8.8 8.6 8.6  --   --   --   CREATININE 0.88  --  0.91 1.09 1.08 1.13 1.18 1.15 1.23  GENTTROUGH  --  0.5  --   --   --   --   --   --  0.6    Estimated Creatinine Clearance: 83 mL/min (by C-G formula based on SCr of 1.23 mg/dL).    No Known Allergies  Vancomycin 5/20 x1 Ampicillin 5/20>> Gentamicin 5/20>> *5/22 GT 0.5 *5/28 BT 0.6  5/19 BCx: 2/2 enterococcus > pan sens 5/19 BCID: enterococcus, no resistance 5/21 BCx: negative  Eryc Bodey D. Dacotah Cabello, PharmD, BCPS Clinical Pharmacist [phone] 05/08/2018 9:30 PM

## 2018-05-09 LAB — BASIC METABOLIC PANEL
ANION GAP: 7 (ref 5–15)
BUN: 17 mg/dL (ref 6–20)
CHLORIDE: 101 mmol/L (ref 101–111)
CO2: 30 mmol/L (ref 22–32)
Calcium: 8.9 mg/dL (ref 8.9–10.3)
Creatinine, Ser: 1.23 mg/dL (ref 0.61–1.24)
GFR calc Af Amer: >60 mL/min (ref >60)
GFR calc non Af Amer: >60 mL/min (ref >60)
Glucose, Bld: 107 mg/dL — ABNORMAL HIGH (ref 65–99)
POTASSIUM: 4.1 mmol/L (ref 3.5–5.1)
Sodium: 138 mmol/L (ref 135–145)

## 2018-05-09 LAB — CBC
HEMATOCRIT: 32.9 % — AB (ref 39.0–52.0)
Hemoglobin: 10.2 g/dL — ABNORMAL LOW (ref 13.0–17.0)
MCH: 25.8 pg — AB (ref 26.0–34.0)
MCHC: 31 g/dL (ref 30.0–36.0)
MCV: 83.3 fL (ref 78.0–100.0)
Platelets: 338 10*3/uL (ref 150–400)
RBC: 3.95 MIL/uL — ABNORMAL LOW (ref 4.22–5.81)
RDW: 13.8 % (ref 11.5–15.5)
WBC: 9.7 10*3/uL (ref 4.0–10.5)

## 2018-05-09 LAB — MAGNESIUM: MAGNESIUM: 2.1 mg/dL (ref 1.7–2.4)

## 2018-05-09 LAB — PROTIME-INR
INR: 0.96
Prothrombin Time: 12.7 seconds (ref 11.4–15.2)

## 2018-05-09 NOTE — Plan of Care (Signed)
  Problem: Clinical Measurements: Goal: Will remain free from infection Outcome: Progressing Note:  IV abx given as ordered.

## 2018-05-09 NOTE — Progress Notes (Signed)
PROGRESS NOTE        PATIENT DETAILS Name: Jeffery Bennett Age: 37 y.o. Sex: male Date of Birth: 07-19-1981 Admit Date: 04/29/2018 Admitting Physician Eduard Clos, MD CWC:[id number], No Pcp Per  Brief Narrative:  Patient is a 37 y.o. 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  Remains on empiric ampicillin and gentamicin-evaluated by thoracic surgery, with potential plans for aortic valve replacement and the next 1-2 weeks.  See below for further details  Subjective: Patient interviewed and examined this morning.  Denies complaints.  Aware of plan dental procedure mid next week.  As per RN, no acute issues noted.   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 Orthopantogram also reveals some dental caries and periodontal disease for which dentist Dr. Robin Searing plans surgical intervention mid next week.  Stable.  Creatinine is stable in the 1.2 range compared to yesterday but has gradually increased over the last few days.  Monitor BMP closely.  IV drug use: Acknowledged using heroin 3 weeks back to prior MD, urine drug screen positive for cocaine as well.  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  It seems that he may be using all these illicit agents in spite of being on methadone as an outpatient.  Remains on methadone- EKG done 5/28 showed normal QTC.  He was counseled.   NSVT/SVT-   TTE 5/24: LVEF 60-65%, severe AR, SBE on aortic valve likely large vegetation versus stone leaflet segments, severe AR.  Possibly due to  endocarditis and severe AR.  Continue telemetry.  Blood pressures remain soft.  Consider low-dose beta-blockers if blood pressures improve.  Electrolytes stable.  Chronic hepatitis C: Outpatient follow-up with infectious disease  Mild normocytic anemia: Mild-probably secondary to acute illness-doubt any further work-up is required-apart from periodic stable   DVT Prophylaxis: Prophylactic Lovenox   Code Status: Full code   Family Communication: None at bedside  Disposition Plan: Remain inpatient-will likely remain inpatient for several weeks to complete IV antibiotics/and aortic valve replacement  Antimicrobial agents: Anti-infectives (From admission, onward)   Start     Dose/Rate Route Frequency Ordered Stop   04/30/18 1800  gentamicin (GARAMYCIN) IVPB 100 mg     100 mg 200 mL/hr over 30 Minutes Intravenous Every 12 hours 04/30/18 1204     04/30/18 1200  vancomycin (VANCOCIN) IVPB 1000 mg/200 mL premix  Status:  Discontinued     1,000 mg 200 mL/hr over 60 Minutes Intravenous Every 8 hours 04/30/18 0237 04/30/18 1154   04/30/18 1200  ampicillin (OMNIPEN) 2 g in sodium chloride 0.9 % 100 mL IVPB     2 g 300 mL/hr over 20 Minutes Intravenous Every 4 hours 04/30/18 1154     04/30/18 0245  vancomycin (VANCOCIN) 1,500 mg in sodium chloride 0.9 % 500 mL IVPB     1,500 mg 250 mL/hr over 120 Minutes Intravenous  Once 04/30/18 0237 04/30/18 0555      Procedures: 5/24>>TEE  CONSULTS:  ID and TCTS, dental surgery  Time spent: 20 minutes  MEDICATIONS: Scheduled Meds:  enoxaparin (LOVENOX) injection  40 mg Subcutaneous Q24H   methadone  100 mg Oral Daily   nicotine  21 mg Transdermal Daily   Continuous Infusions:  ampicillin (OMNIPEN) IV Stopped (05/09/18 1658)   gentamicin Stopped (05/09/18 1712)   PRN Meds:.acetaminophen **OR** acetaminophen, ondansetron **OR** ondansetron (ZOFRAN) IV, sodium chloride flush   PHYSICAL EXAM: Vital signs: Vitals:   05/08/18 2209  05/09/18 0639 05/09/18 0830 05/09/18 1750  BP: 122/61 115/66 (!) 107/52 (!) 108/57  Pulse: 88 85 70 75  Resp: Temp: 98.2 F (36.8 C) 98.6 F (37 C) 98.4 F (36.9 C) 97.7 F (36.5 C)  TempSrc: Oral Oral Oral Oral  SpO2: 100% 100% 98% 97%  Weight:      Height:       Filed Weights   05/06/18 2138 05/07/18 0500 05/07/18 2117  Weight: 78.9 kg (173 lb 15.2 oz) 78.9 kg (173 lb 15.1 oz) 78.7 kg (173 lb 8 oz)   Body mass index is 25.62 kg/m.   Exam  General exam: Pleasant young male, well-built and nourished, sitting up comfortably in bed. RS: Clear to auscultation.  No increased work of breathing. CVS: S1 and S2 heard, RRR.  No JVD.  Mid systolic and early diastolic murmurs best heard at apex.  Telemetry personally reviewed: Sinus rhythm.  Intermittent nonsustained VT between 3 and 6 beats. GI: Abdomen nondistended, soft and nontender.  Normal bowel sounds heard. CNS: Alert and oriented.  No focal neurological deficits. Extremities: Symmetric 5 x 5 power. Osler's node in the right second toe stable Oral cavity: Multiple missing teeth.  Caries +.   I have personally reviewed following labs and imaging studies  LABORATORY DATA: CBC: Recent Labs  Lab 05/03/18 0649 05/04/18 0659 05/05/18 0449 05/06/18 0604 05/09/18 0429  WBC 7.8 8.8 8.6 8.6 9.7  HGB 10.9* 11.0* 10.2* 10.4* 10.2*  HCT 35.2* 35.7* 32.7* 33.6* 32.9*  MCV 82.8 82.8 82.0 83.0 83.3  PLT 336 358 355 340 338    Basic Metabolic Panel: Recent Labs  Lab 05/04/18 0659 05/05/18 0449 05/06/18 0604 05/07/18 0515 05/07/18 1747 05/08/18 1810 05/09/18 0429  NA 138 138 138  --  139  --  138  K 4.0 4.0 3.9  --  4.0  --  4.1  CL 97* 97* 99*  --  100*  --  101  CO2 33* 31 31  --  30  --  30  GLUCOSE 93 104* 106*  --  110*  --  107*  BUN --  10  --  17  CREATININE 1.09 1.08 1.13 1.18 1.15 1.23 1.23  CALCIUM 9.1 9.1 8.8*  --  9.0  --  8.9  MG  --  2.0  --   --  2.1  --  2.1    GFR: Estimated  Creatinine Clearance: 83 mL/min (by C-G formula based on SCr of 1.23 mg/dL).  Coagulation Profile: Recent Labs  Lab 05/09/18 0429  INR 0.96     Urine analysis:    Component Value Date/Time   COLORURINE YELLOW 04/30/2018 0024   APPEARANCEUR CLEAR 04/30/2018 0024   LABSPEC >1.046 (H) 04/30/2018 0024   PHURINE 5.0 04/30/2018 0024   GLUCOSEU NEGATIVE 04/30/2018 0024   HGBUR NEGATIVE 04/30/2018 0024   BILIRUBINUR NEGATIVE 04/30/2018 0024   KETONESUR NEGATIVE 04/30/2018 0024   PROTEINUR NEGATIVE 04/30/2018 0024  UROBILINOGEN 0.2 10/05/2009 1913   NITRITE NEGATIVE 04/30/2018 0024   LEUKOCYTESUR NEGATIVE 04/30/2018 0024    Sepsis Labs: Lactic Acid, Venous    Component Value Date/Time   LATICACIDVEN 0.73 11/10/2016 1321    MICROBIOLOGY: Recent Results (from the past 240 hour(s))  Blood culture (routine x 2)     Status: Abnormal   Collection Time: 04/29/18  6:46 PM  Result Value Ref Range Status   Specimen Description BLOOD BLOOD RIGHT FOREARM  Final   Special Requests   Final    BOTTLES DRAWN AEROBIC AND ANAEROBIC Blood Culture adequate volume   Culture  Setup Time   Final    GRAM POSITIVE COCCI IN CHAINS IN BOTH AEROBIC AND ANAEROBIC BOTTLES CRITICAL RESULT CALLED TO, READ BACK BY AND VERIFIED WITH: MARTIN PHARMD AT 1136 ON 052019 BY [facility] PT IN ED ADMITTED TO [facility] CALLED [facility] AND [facility] Performed at [facility], [address] [place], [place] [address]    Culture ENTEROCOCCUS FAECALIS (A)  Final   Report Status 05/02/2018 FINAL  Final   Organism ID, Bacteria ENTEROCOCCUS FAECALIS  Final      Susceptibility   Enterococcus faecalis - MIC*    AMPICILLIN <=2 SENSITIVE Sensitive     VANCOMYCIN 1 SENSITIVE Sensitive     GENTAMICIN SYNERGY SENSITIVE Sensitive     * ENTEROCOCCUS FAECALIS  Blood Culture ID Panel (Reflexed)     Status: Abnormal   Collection Time: 04/29/18  6:46 PM  Result Value Ref Range Status   Enterococcus species DETECTED (A) NOT DETECTED Final     Comment: CRITICAL RESULT CALLED TO, READ BACK BY AND VERIFIED WITH: KELLY CONNOR RN AT 1135 ON [id number] BY SJ[facility]    Vancomycin resistance NOT DETECTED NOT DETECTED Final   Listeria monocytogenes NOT DETECTED NOT DETECTED Final   Staphylococcus species NOT DETECTED NOT DETECTED Final   Staphylococcus aureus NOT DETECTED NOT DETECTED Final   Streptococcus species NOT DETECTED NOT DETECTED Final   Streptococcus agalactiae NOT DETECTED NOT DETECTED Final   Streptococcus pneumoniae NOT DETECTED NOT DETECTED Final   Streptococcus pyogenes NOT DETECTED NOT DETECTED Final   Acinetobacter baumannii NOT DETECTED NOT DETECTED Final   Enterobacteriaceae species NOT DETECTED NOT DETECTED Final   Enterobacter cloacae complex NOT DETECTED NOT DETECTED Final   Escherichia coli NOT DETECTED NOT DETECTED Final   Klebsiella oxytoca NOT DETECTED NOT DETECTED Final   Klebsiella pneumoniae NOT DETECTED NOT DETECTED Final   Proteus species NOT DETECTED NOT DETECTED Final   Serratia marcescens NOT DETECTED NOT DETECTED Final   Haemophilus influenzae NOT DETECTED NOT DETECTED Final   Neisseria meningitidis NOT DETECTED NOT DETECTED Final   Pseudomonas aeruginosa NOT DETECTED NOT DETECTED Final   Candida albicans NOT DETECTED NOT DETECTED Final   Candida glabrata NOT DETECTED NOT DETECTED Final   Candida krusei NOT DETECTED NOT DETECTED Final   Candida parapsilosis NOT DETECTED NOT DETECTED Final   Candida tropicalis NOT DETECTED NOT DETECTED Final    Comment: Performed at [facility], [address], [place], [place] [address]  Culture, blood (routine x 2)     Status: None   Collection Time: 05/01/18  9:27 PM  Result Value Ref Range Status   Specimen Description BLOOD LEFT ANTECUBITAL  Final   Special Requests   Final    BOTTLES DRAWN AEROBIC AND ANAEROBIC Blood Culture adequate volume   Culture   Final    NO GROWTH 5 DAYS Performed at [facility]  Lab, [address], [place], [place] [address]     Report Status 05/06/2018 FINAL  Final  Culture, blood (routine x 2)     Status: None   Collection Time: 05/01/18  9:32 PM  Result Value Ref Range Status   Specimen Description BLOOD LEFT FOREARM  Final   Special Requests   Final    BOTTLES DRAWN AEROBIC AND ANAEROBIC Blood Culture adequate volume   Culture   Final    NO GROWTH 5 DAYS Performed at [facility], [address], [place], [place] [address]    Report Status 05/06/2018 FINAL  Final    RADIOLOGY STUDIES/RESULTS: Dg Orthopantogram  Result Date: 05/04/2018 CLINICAL DATA:  Bottom right and top left tooth pain. EXAM: ORTHOPANTOGRAM/PANORAMIC COMPARISON:  None. FINDINGS: Dental caries of the right lower first and third molars are suggested characterized by tooth deformity and lucencies. Periapical lucencies consistent with diffuse periodontal disease more notably about the right and left canines and bilateral upper lateral incisors. Dental caries of the left upper remaining molar. IMPRESSION: Dental caries as above with scattered areas of periapical lucencies compatible with periodontal disease. Electronically Signed   By: Tollie Eth M.D.   On: 05/04/2018 22:44   Dg Chest 2 View  Result Date: 04/29/2018 CLINICAL DATA:  Patient with left shoulder pain. EXAM: CHEST - 2 VIEW COMPARISON:  Chest radiograph 01/21/2016. FINDINGS: Stable cardiac and mediastinal contours. Heterogeneous opacities right lung base. No pleural effusion or pneumothorax. Thoracic spine degenerative changes. Surgical hardware left humerus. IMPRESSION: Heterogeneous opacities right lung base may represent scarring. Infection not excluded. Electronically Signed   By: Annia Belt M.D.   On: 04/29/2018 17:20   Ct Angio Chest Pe W/cm &/or Wo Cm  Result Date: 04/29/2018 CLINICAL DATA:  Dyspnea. Left-sided chest/shoulder pain. High pretest probability for pulmonary embolus. EXAM: CT ANGIOGRAPHY CHEST WITH CONTRAST TECHNIQUE: Multidetector CT imaging of the chest was  performed using the standard protocol during bolus administration of intravenous contrast. Multiplanar CT image reconstructions and MIPs were obtained to evaluate the vascular anatomy. CONTRAST:  ISOVUE-370 IOPAMIDOL (ISOVUE-370) INJECTION 76% COMPARISON:  Radiographs 04/29/2018.  Chest CT 01/14/2016 FINDINGS: Cardiovascular: There are no filling defects within the pulmonary arteries to suggest pulmonary embolus. Thoracic aorta is normal in caliber without dissection or aneurysm. The heart is normal in size. No pericardial effusion. Mediastinum/Nodes: Prominent right lower hilar lymph node measures 10 mm short axis, may be reactive. No mediastinal adenopathy. The esophagus is decompressed. No visualized thyroid nodule. Lungs/Pleura: Slight heterogeneous attenuation of lung parenchyma. Areas of bronchial filling in the right lower lobe subsegmental branches, with small rounded intraluminal densities within the right middle lobe bronchus, right and left lower lobe bronchus. Bronchial thickening in the lower lobe bronchi with scattered mucous plugging. Heterogeneous densities in the dependent lower lobes may be atelectasis or infectious. No pulmonary edema. No significant pleural fluid. Upper Abdomen: No acute abnormality. Musculoskeletal: Remote left rib fractures. Remote proximal left humerus fracture post ORIF. There are no acute or suspicious osseous abnormalities. Review of the MIP images confirms the above findings. IMPRESSION: 1. No pulmonary embolus. 2. Heterogeneous lung parenchyma can be seen in the setting of small airways disease. Bronchial thickening in the lower lobes, with areas of mucous plugging. Small rounded intraluminal densities in the right middle, right and left lower lobe bronchi, may be retained secretions or aspiration in the appropriate clinical setting. 3. Dependent lower lobe densities may be atelectasis or infectious pneumonitis, possibly related to bronchial thickening and  mucous  plugging. Electronically Signed   By: Rubye Oaks M.D.   On: 04/29/2018 22:30   Dg Foot 2 Views Right  Result Date: 04/29/2018 CLINICAL DATA:  Right foot pain EXAM: RIGHT FOOT - 2 VIEW COMPARISON:  None. FINDINGS: No fracture or malalignment. Joint spaces are maintained. Soft tissues are unremarkable. IMPRESSION: Negative. Electronically Signed   By: Jasmine Pang M.D.   On: 04/29/2018 19:41   [place] Ekg Site Rite  Result Date: 05/07/2018 If Site Rite image not attached, placement could not be confirmed due to current cardiac rhythm.    LOS: 9 days   Marcellus Scott, MD, FACP, [facility]. [facility] Pager [phone]  If 7PM-7AM, please contact night-coverage [email or web address] Password [facility] 05/09/2018, 6:47 PM

## 2018-05-09 NOTE — Progress Notes (Signed)
Pt requested to have PICC capped. Central line/CLABSI teaching done. Pt declined to leave line infusing between Q4 hour doses of antibiotic. Line capped.

## 2018-05-10 ENCOUNTER — Other Ambulatory Visit (HOSPITAL_COMMUNITY): Payer: Self-pay

## 2018-05-10 LAB — BASIC METABOLIC PANEL
Anion gap: 12 (ref 5–15)
BUN: 18 mg/dL (ref 6–20)
CHLORIDE: 100 mmol/L — AB (ref 101–111)
CO2: 28 mmol/L (ref 22–32)
CREATININE: 1.23 mg/dL (ref 0.61–1.24)
Calcium: 9.2 mg/dL (ref 8.9–10.3)
GFR calc Af Amer: >60 mL/min (ref >60)
GFR calc non Af Amer: >60 mL/min (ref >60)
Glucose, Bld: 93 mg/dL (ref 65–99)
Potassium: 4.3 mmol/L (ref 3.5–5.1)
Sodium: 140 mmol/L (ref 135–145)

## 2018-05-10 NOTE — Progress Notes (Signed)
PROGRESS NOTE        PATIENT DETAILS Name: Jeffery Bennett Age: 37 y.o. Sex: male Date of Birth: Apr 20, 1981 Admit Date: 04/29/2018 Admitting Physician Eduard Clos, MD [id number], No Pcp Per  Brief Narrative:  Patient is a 37 y.o. 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  Remains on empiric ampicillin and gentamicin-evaluated by thoracic surgery, with potential plans for aortic valve replacement and the next 1-2 weeks.  See below for further details  Subjective: No complaints reported.  As per RN, no acute issues noted.   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 Orthopantogram also reveals some dental caries and periodontal disease for which dentist Dr. Robin Searing plans surgical intervention mid next week.  Stable.  Creatinine has been stable in the 1.2 range.  IV drug use: Acknowledged using heroin 3 weeks back to prior MD, urine drug screen positive for cocaine as well.  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  It seems that he may be using all these illicit agents in spite of being on methadone as an outpatient.  Remains on methadone- EKG done 5/28 showed normal QTC.  He was counseled.  No change/stable.  NSVT/SVT-   TTE 5/24: LVEF 60-65%, severe AR, SBE on aortic valve likely large vegetation versus stone leaflet segments, severe AR.  Possibly due to endocarditis and severe AR.  Continue telemetry.  Blood pressures remain soft.  Consider low-dose beta-blockers if blood pressures improve.  Electrolytes  stable.  No further episodes noted since yesterday.  Chronic hepatitis C: Outpatient follow-up with infectious disease  Mild normocytic anemia: Mild-probably secondary to acute illness-doubt any further work-up is required-apart from periodic stable   DVT Prophylaxis: Prophylactic Lovenox   Code Status: Full code   Family Communication: None at bedside  Disposition Plan: Remain inpatient-will likely remain inpatient for several weeks to complete IV antibiotics/and aortic valve replacement  Antimicrobial agents: Anti-infectives (From admission, onward)   Start     Dose/Rate Route Frequency Ordered Stop   04/30/18 1800  gentamicin (GARAMYCIN) IVPB 100 mg     100 mg 200 mL/hr over 30 Minutes Intravenous Every 12 hours 04/30/18 1204     04/30/18 1200  vancomycin (VANCOCIN) IVPB 1000 mg/200 mL premix  Status:  Discontinued     1,000 mg 200 mL/hr over 60 Minutes Intravenous Every 8 hours 04/30/18 0237 04/30/18 1154   04/30/18 1200  ampicillin (OMNIPEN) 2 g in sodium chloride 0.9 % 100 mL IVPB     2 g 300 mL/hr over 20 Minutes Intravenous Every 4 hours 04/30/18 1154     04/30/18 0245  vancomycin (VANCOCIN) 1,500 mg in sodium chloride 0.9 % 500 mL IVPB     1,500 mg 250 mL/hr over 120 Minutes Intravenous  Once 04/30/18 0237 04/30/18 0555      Procedures: 5/24>>TEE  CONSULTS:  ID and TCTS, dental surgery  Time spent: 20 minutes  MEDICATIONS: Scheduled Meds: . enoxaparin (LOVENOX) injection  40 mg Subcutaneous Q24H  . methadone  100 mg Oral Daily  . nicotine  21 mg Transdermal Daily   Continuous Infusions: . ampicillin (OMNIPEN) IV 2 g (05/10/18 1326)  . gentamicin Stopped (05/10/18 0624)   PRN Meds:.acetaminophen **OR** acetaminophen, ondansetron **OR** ondansetron (ZOFRAN) IV, sodium chloride flush   PHYSICAL EXAM: Vital signs: Vitals:   05/09/18 2001 05/10/18 0502 05/10/18 1011 05/10/18 1011  BP: 108/60 (!) 103/57 (!) 107/49 (!) 107/49  Pulse: 87 83 74 76    Resp: Temp: 97.7 F (36.5 C) 98.1 F (36.7 C) 97.8 F (36.6 C) 97.8 F (36.6 C)  TempSrc: Oral Oral Oral Oral  SpO2: 99% 98% 99% 99%  Weight: 78.9 kg (173 lb 15.1 oz)     Height:       Filed Weights   05/07/18 0500 05/07/18 2117 05/09/18 2001  Weight: 78.9 kg (173 lb 15.1 oz) 78.7 kg (173 lb 8 oz) 78.9 kg (173 lb 15.1 oz)   Body mass index is 25.69 kg/m.   Exam: No change in clinical exam compared to yesterday.  General exam: Pleasant young male, well-built and nourished, sitting up comfortably in bed. RS: Clear to auscultation.  No increased work of breathing. CVS: S1 and S2 heard, RRR.  No JVD.  Mid systolic and early diastolic murmurs best heard at apex.  Telemetry personally reviewed: Sinus rhythm.  No further episodes of NSVT or NS SVT. GI: Abdomen nondistended, soft and nontender.  Normal bowel sounds heard. CNS: Alert and oriented.  No focal neurological deficits. Extremities: Symmetric 5 x 5 power. Osler's node in the right second toe stable Oral cavity: Multiple missing teeth.  Caries +.   I have personally reviewed following labs and imaging studies  LABORATORY DATA: CBC: Recent Labs  Lab 05/04/18 0659 05/05/18 0449 05/06/18 0604 05/09/18 0429  WBC 8.8 8.6 8.6 9.7  HGB 11.0* 10.2* 10.4* 10.2*  HCT 35.7* 32.7* 33.6* 32.9*  MCV 82.8 82.0 83.0 83.3  PLT 358 355 340 338    Basic Metabolic Panel: Recent Labs  Lab 05/05/18 0449 05/06/18 0604 05/07/18 0515 05/07/18 1747 05/08/18 1810 05/09/18 0429 05/10/18 0435  NA 138 138  --  139  --  138 140  K 4.0 3.9  --  4.0  --  4.1 4.3  CL 97* 99*  --  100*  --  101 100*  CO2 31 31  --  30  --  30 28  GLUCOSE 104* 106*  --  110*  --  107* 93  BUN 9 9  --  10  --  17 18  CREATININE 1.08 1.13 1.18 1.15 1.23 1.23 1.23  CALCIUM 9.1 8.8*  --  9.0  --  8.9 9.2  MG 2.0  --   --  2.1  --  2.1  --     GFR: Estimated Creatinine Clearance: 83 mL/min (by C-G formula based on SCr of 1.23  mg/dL).  Coagulation Profile: Recent Labs  Lab 05/09/18 0429  INR 0.96     Urine analysis:    Component Value Date/Time   COLORURINE YELLOW 04/30/2018 0024   APPEARANCEUR CLEAR 04/30/2018 0024   LABSPEC >1.046 (H) 04/30/2018 0024   PHURINE 5.0 04/30/2018 0024   GLUCOSEU NEGATIVE 04/30/2018 0024   HGBUR NEGATIVE 04/30/2018 0024   BILIRUBINUR NEGATIVE 04/30/2018 0024   KETONESUR NEGATIVE 04/30/2018 0024   PROTEINUR NEGATIVE 04/30/2018 0024   UROBILINOGEN 0.2 10/05/2009 1913   NITRITE NEGATIVE 04/30/2018 0024  LEUKOCYTESUR NEGATIVE 04/30/2018 0024    Sepsis Labs: Lactic Acid, Venous    Component Value Date/Time   LATICACIDVEN 0.73 11/10/2016 1321    MICROBIOLOGY: Recent Results (from the past 240 hour(s))  Culture, blood (routine x 2)     Status: None   Collection Time: 05/01/18  9:27 PM  Result Value Ref Range Status   Specimen Description BLOOD LEFT ANTECUBITAL  Final   Special Requests   Final    BOTTLES DRAWN AEROBIC AND ANAEROBIC Blood Culture adequate volume   Culture   Final    NO GROWTH 5 DAYS Performed at [facility], [address], [place], [place] [address]    Report Status 05/06/2018 FINAL  Final  Culture, blood (routine x 2)     Status: None   Collection Time: 05/01/18  9:32 PM  Result Value Ref Range Status   Specimen Description BLOOD LEFT FOREARM  Final   Special Requests   Final    BOTTLES DRAWN AEROBIC AND ANAEROBIC Blood Culture adequate volume   Culture   Final    NO GROWTH 5 DAYS Performed at [facility], [address], [place], [place] [address]    Report Status 05/06/2018 FINAL  Final    RADIOLOGY STUDIES/RESULTS: Dg Orthopantogram  Result Date: 05/04/2018 CLINICAL DATA:  Bottom right and top left tooth pain. EXAM: ORTHOPANTOGRAM/PANORAMIC COMPARISON:  None. FINDINGS: Dental caries of the right lower first and third molars are suggested characterized by tooth deformity and lucencies. Periapical lucencies consistent  with diffuse periodontal disease more notably about the right and left canines and bilateral upper lateral incisors. Dental caries of the left upper remaining molar. IMPRESSION: Dental caries as above with scattered areas of periapical lucencies compatible with periodontal disease. Electronically Signed   By: Tollie Eth M.D.   On: 05/04/2018 22:44   Dg Chest 2 View  Result Date: 04/29/2018 CLINICAL DATA:  Patient with left shoulder pain. EXAM: CHEST - 2 VIEW COMPARISON:  Chest radiograph 01/21/2016. FINDINGS: Stable cardiac and mediastinal contours. Heterogeneous opacities right lung base. No pleural effusion or pneumothorax. Thoracic spine degenerative changes. Surgical hardware left humerus. IMPRESSION: Heterogeneous opacities right lung base may represent scarring. Infection not excluded. Electronically Signed   By: Annia Belt M.D.   On: 04/29/2018 17:20   Ct Angio Chest Pe W/cm &/or Wo Cm  Result Date: 04/29/2018 CLINICAL DATA:  Dyspnea. Left-sided chest/shoulder pain. High pretest probability for pulmonary embolus. EXAM: CT ANGIOGRAPHY CHEST WITH CONTRAST TECHNIQUE: Multidetector CT imaging of the chest was performed using the standard protocol during bolus administration of intravenous contrast. Multiplanar CT image reconstructions and MIPs were obtained to evaluate the vascular anatomy. CONTRAST:  ISOVUE-370 IOPAMIDOL (ISOVUE-370) INJECTION 76% COMPARISON:  Radiographs 04/29/2018.  Chest CT 01/14/2016 FINDINGS: Cardiovascular: There are no filling defects within the pulmonary arteries to suggest pulmonary embolus. Thoracic aorta is normal in caliber without dissection or aneurysm. The heart is normal in size. No pericardial effusion. Mediastinum/Nodes: Prominent right lower hilar lymph node measures 10 mm short axis, may be reactive. No mediastinal adenopathy. The esophagus is decompressed. No visualized thyroid nodule. Lungs/Pleura: Slight heterogeneous attenuation of lung parenchyma. Areas  of bronchial filling in the right lower lobe subsegmental branches, with small rounded intraluminal densities within the right middle lobe bronchus, right and left lower lobe bronchus. Bronchial thickening in the lower lobe bronchi with scattered mucous plugging. Heterogeneous densities in the dependent lower lobes may be atelectasis or infectious. No pulmonary edema. No significant pleural  fluid. Upper Abdomen: No acute abnormality. Musculoskeletal: Remote left rib fractures. Remote proximal left humerus fracture post ORIF. There are no acute or suspicious osseous abnormalities. Review of the MIP images confirms the above findings. IMPRESSION: 1. No pulmonary embolus. 2. Heterogeneous lung parenchyma can be seen in the setting of small airways disease. Bronchial thickening in the lower lobes, with areas of mucous plugging. Small rounded intraluminal densities in the right middle, right and left lower lobe bronchi, may be retained secretions or aspiration in the appropriate clinical setting. 3. Dependent lower lobe densities may be atelectasis or infectious pneumonitis, possibly related to bronchial thickening and mucous plugging. Electronically Signed   By: Rubye Oaks M.D.   On: 04/29/2018 22:30   Dg Foot 2 Views Right  Result Date: 04/29/2018 CLINICAL DATA:  Right foot pain EXAM: RIGHT FOOT - 2 VIEW COMPARISON:  None. FINDINGS: No fracture or malalignment. Joint spaces are maintained. Soft tissues are unremarkable. IMPRESSION: Negative. Electronically Signed   By: Jasmine Pang M.D.   On: 04/29/2018 19:41   [place] Ekg Site Rite  Result Date: 05/07/2018 If Site Rite image not attached, placement could not be confirmed due to current cardiac rhythm.    LOS: 10 days   Marcellus Scott, MD, FACP, [facility]. [facility] Pager [phone]  If 7PM-7AM, please contact night-coverage [email or web address] Password [facility] 05/10/2018, 3:27 PM

## 2018-05-10 NOTE — Progress Notes (Signed)
Spoke with pt this am during shift change at bedside reporting that disconnecting and flushing his PICC so frequently wasn't advised, that it puts him at greater risk of infection on top of the infection he already has and being treated for.  At this time speaking with pt again, after IV team nurse had talked with him. Pt still insist on having his line disconnected from him in between antibiotics, even if they are every 4 hours. " I don't want to be hooked up to that pole all the time, if I was at home I would be able to disconnect it" " I'll just disconnect it myself if I have to" pt states.  Advised pt not to do that.  He wants to speak with the doctor about it.   Informed unit director of pt wanting line disconnected after each antibiotic and he's advisement of risk of infection. Unit AD did go in to speak with pt about the matter. Pt informed AD that he is concerned he will pull out the line the way he keeps tossing around in the bed. AD spoke with IV team, and decision made to contact IV team to come and flush pt's PICC line after antibiotics complete, in doing so this would keep the pt from disconnecting the line himself and risk clotting the line, and more risk of infection, pt is aware and accepts responsibility that disconnecting, flushing and reconnecting the line so frequently can put him at greater risk of further infections.

## 2018-05-10 NOTE — Progress Notes (Signed)
11:45 Consulted by Tammy RN to go to room and speak with pt. About having CVC being flushed between antibiotics. RN Tammy stated pt. Getting antibiotic every 4hours. Explained to pt. That disconnecting the CVC that frequently and flushing it was putting him at increased risk of infection. He said that " other nurses did it." I explained to him that as a RN on the IV team we did not promote that and I would not disconnect and flush him when his next antibiotic was due in . He stated" that's just because you're lazy!" and " you dont want to take the time to come up here." I explained to him that that wasn't the case and left the room.

## 2018-05-11 LAB — BASIC METABOLIC PANEL
ANION GAP: 4 — AB (ref 5–15)
BUN: 22 mg/dL — AB (ref 6–20)
CO2: 30 mmol/L (ref 22–32)
Calcium: 8.5 mg/dL — ABNORMAL LOW (ref 8.9–10.3)
Chloride: 104 mmol/L (ref 101–111)
Creatinine, Ser: 1.21 mg/dL (ref 0.61–1.24)
Glucose, Bld: 110 mg/dL — ABNORMAL HIGH (ref 65–99)
POTASSIUM: 4.1 mmol/L (ref 3.5–5.1)
SODIUM: 138 mmol/L (ref 135–145)

## 2018-05-11 LAB — GENTAMICIN LEVEL, RANDOM: GENTAMICIN RM: 1.2 ug/mL

## 2018-05-11 NOTE — Progress Notes (Addendum)
ID PROGRESS NOTE  37yo M with AV enterococcal endocarditis currently on day 12 of amp/gent. Tolerating gent dosing. Cr stable at 1.2. He is to have dental extractions next week.   - continue on current course of iv abtx - continue on current dose of methadone tx.

## 2018-05-11 NOTE — Progress Notes (Signed)
PROGRESS NOTE        PATIENT DETAILS Name: Jeffery Bennett Age: 37 y.o. Sex: male Date of Birth: 1981/02/15 Admit Date: 04/29/2018 Admitting Physician Eduard Clos, MD ZOX:[id number], No Pcp Per  Brief Narrative:  Patient is a 37 y.o. 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  Remains on empiric ampicillin and gentamicin-evaluated by thoracic surgery, with potential plans for aortic valve replacement and the next 1-2 weeks.  See below for further details  Subjective: No new complaints.   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 Orthopantogram also reveals some dental caries and periodontal disease for which dentist Dr. Robin Searing plans surgical intervention mid next week.  Stable.  Creatinine has been stable in the 1.2 range.  As per ID follow-up, day 12 of amp/gent on 5/31.  IV drug use: Acknowledged using heroin 3 weeks back to prior MD, urine drug screen positive for cocaine as well.  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  It seems that he may be using all these illicit agents in spite of being on methadone as an outpatient.  Remains on methadone- EKG done 5/28 showed normal QTC.  He was counseled.  No change/stable.  NSVT/SVT-   TTE 5/24: LVEF 60-65%, severe AR, SBE on aortic valve likely large vegetation versus stone leaflet segments, severe AR.  Possibly due to endocarditis and severe AR.  Continue telemetry.  Blood pressures remain soft.  Consider low-dose beta-blockers if blood pressures improve.   Electrolytes stable.  No further episodes noted since yesterday.  Chronic hepatitis C: Outpatient follow-up with infectious disease  Mild normocytic anemia: Mild-probably secondary to acute illness-doubt any further work-up is required-apart from periodic stable   DVT Prophylaxis: Prophylactic Lovenox   Code Status: Full code   Family Communication: None at bedside  Disposition Plan: Remain inpatient-will likely remain inpatient for several weeks to complete IV antibiotics/and aortic valve replacement  Antimicrobial agents: Anti-infectives (From admission, onward)   Start     Dose/Rate Route Frequency Ordered Stop   04/30/18 1800  gentamicin (GARAMYCIN) IVPB 100 mg     100 mg 200 mL/hr over 30 Minutes Intravenous Every 12 hours 04/30/18 1204     04/30/18 1200  vancomycin (VANCOCIN) IVPB 1000 mg/200 mL premix  Status:  Discontinued     1,000 mg 200 mL/hr over 60 Minutes Intravenous Every 8 hours 04/30/18 0237 04/30/18 1154   04/30/18 1200  ampicillin (OMNIPEN) 2 g in sodium chloride 0.9 % 100 mL IVPB     2 g 300 mL/hr over 20 Minutes Intravenous Every 4 hours 04/30/18 1154     04/30/18 0245  vancomycin (VANCOCIN) 1,500 mg in sodium chloride 0.9 % 500 mL IVPB     1,500 mg 250 mL/hr over 120 Minutes Intravenous  Once 04/30/18 0237 04/30/18 0555      Procedures: 5/24>>TEE  CONSULTS:  ID and TCTS, dental surgery  Time spent: 20 minutes  MEDICATIONS: Scheduled Meds: . enoxaparin (  LOVENOX) injection  40 mg Subcutaneous Q24H  . methadone  100 mg Oral Daily  . nicotine  21 mg Transdermal Daily   Continuous Infusions: . ampicillin (OMNIPEN) IV 2 g (05/11/18 1723)  . gentamicin 100 mg (05/11/18 1803)   PRN Meds:.acetaminophen **OR** acetaminophen, ondansetron **OR** ondansetron (ZOFRAN) IV, sodium chloride flush   PHYSICAL EXAM: Vital signs: Vitals:   05/10/18 2240 05/11/18 0513 05/11/18 0808 05/11/18 1627  BP: (!) 114/59 (!) 94/51 (!) 99/56 (!) 102/54  Pulse: 93  78 67 90  Resp: Temp: 98.1 F (36.7 C) 98.2 F (36.8 C) 98 F (36.7 C) 98.3 F (36.8 C)  TempSrc: Oral Oral Oral Oral  SpO2: 99% 98% 100% 99%  Weight: 83.1 kg (183 lb 4.8 oz)     Height:       Filed Weights   05/07/18 2117 05/09/18 2001 05/10/18 2240  Weight: 78.7 kg (173 lb 8 oz) 78.9 kg (173 lb 15.1 oz) 83.1 kg (183 lb 4.8 oz)   Body mass index is 27.07 kg/m.   Exam: No change in clinical exam compared to last 2 days.  General exam: [name], well-built and nourished, sitting up comfortably in bed. RS: Clear to auscultation.  No increased work of breathing. CVS: S1 and S2 heard, RRR.  No JVD.  Mid systolic and early diastolic murmurs best heard at apex.  Telemetry personally reviewed: Sinus rhythm.  No NSVT noted. GI: Abdomen nondistended, soft and nontender.  Normal bowel sounds heard. CNS: Alert and oriented.  No focal neurological deficits. Extremities: Symmetric 5 x 5 power. Osler's node in the right second toe stable Oral cavity: Multiple missing teeth.  Caries +.   I have personally reviewed following labs and imaging studies  LABORATORY DATA: CBC: Recent Labs  Lab 05/05/18 0449 05/06/18 0604 05/09/18 0429  WBC 8.6 8.6 9.7  HGB 10.2* 10.4* 10.2*  HCT 32.7* 33.6* 32.9*  MCV 82.0 83.0 83.3  PLT 355 340 338    Basic Metabolic Panel: Recent Labs  Lab 05/05/18 0449 05/06/18 0604  05/07/18 1747 05/08/18 1810 05/09/18 0429 05/10/18 0435 05/11/18 0410  NA 138 138  --  139  --  138 140 138  K 4.0 3.9  --  4.0  --  4.1 4.3 4.1  CL 97* 99*  --  100*  --  101 100* 104  CO2 31 31  --  30  --  GLUCOSE 104* 106*  --  110*  --  107* 93 110*  BUN 9 9  --  10  --  17 18 22*  CREATININE 1.08 1.13   < > 1.15 1.23 1.23 1.23 1.21  CALCIUM 9.1 8.8*  --  9.0  --  8.9 9.2 8.5*  MG 2.0  --   --  2.1  --  2.1  --   --    < > = values in this interval not displayed.    GFR: Estimated Creatinine Clearance: 84.4 mL/min (by C-G formula  based on SCr of 1.21 mg/dL).  Coagulation Profile: Recent Labs  Lab 05/09/18 0429  INR 0.96     Urine analysis:    Component Value Date/Time   COLORURINE YELLOW 04/30/2018 0024   APPEARANCEUR CLEAR 04/30/2018 0024   LABSPEC >1.046 (H) 04/30/2018 0024   PHURINE 5.0 04/30/2018 0024   GLUCOSEU NEGATIVE 04/30/2018 0024   HGBUR NEGATIVE 04/30/2018 0024   BILIRUBINUR NEGATIVE 04/30/2018 0024   KETONESUR NEGATIVE 04/30/2018  0024   PROTEINUR NEGATIVE 04/30/2018 0024   UROBILINOGEN 0.2 10/05/2009 1913   NITRITE NEGATIVE 04/30/2018 0024   LEUKOCYTESUR NEGATIVE 04/30/2018 0024    Sepsis Labs: Lactic Acid, Venous    Component Value Date/Time   LATICACIDVEN 0.73 11/10/2016 1321    MICROBIOLOGY: Recent Results (from the past 240 hour(s))  Culture, blood (routine x 2)     Status: None   Collection Time: 05/01/18  9:27 PM  Result Value Ref Range Status   Specimen Description BLOOD LEFT ANTECUBITAL  Final   Special Requests   Final    BOTTLES DRAWN AEROBIC AND ANAEROBIC Blood Culture adequate volume   Culture   Final    NO GROWTH 5 DAYS Performed at [facility], [address] [place], [place] [address]    Report Status 05/06/2018 FINAL  Final  Culture, blood (routine x 2)     Status: None   Collection Time: 05/01/18  9:32 PM  Result Value Ref Range Status   Specimen Description BLOOD LEFT FOREARM  Final   Special Requests   Final    BOTTLES DRAWN AEROBIC AND ANAEROBIC Blood Culture adequate volume   Culture   Final    NO GROWTH 5 DAYS Performed at [facility], [address], [place], [place] [address]    Report Status 05/06/2018 FINAL  Final    RADIOLOGY STUDIES/RESULTS: Dg Orthopantogram  Result Date: 05/04/2018 CLINICAL DATA:  Bottom right and top left tooth pain. EXAM: ORTHOPANTOGRAM/PANORAMIC COMPARISON:  None. FINDINGS: Dental caries of the right lower first and third molars are suggested characterized by tooth deformity and lucencies. Periapical  lucencies consistent with diffuse periodontal disease more notably about the right and left canines and bilateral upper lateral incisors. Dental caries of the left upper remaining molar. IMPRESSION: Dental caries as above with scattered areas of periapical lucencies compatible with periodontal disease. Electronically Signed   By: Tollie Eth M.D.   On: 05/04/2018 22:44   Dg Chest 2 View  Result Date: 04/29/2018 CLINICAL DATA:  Patient with left shoulder pain. EXAM: CHEST - 2 VIEW COMPARISON:  Chest radiograph 01/21/2016. FINDINGS: Stable cardiac and mediastinal contours. Heterogeneous opacities right lung base. No pleural effusion or pneumothorax. Thoracic spine degenerative changes. Surgical hardware left humerus. IMPRESSION: Heterogeneous opacities right lung base may represent scarring. Infection not excluded. Electronically Signed   By: Annia Belt M.D.   On: 04/29/2018 17:20   Ct Angio Chest Pe W/cm &/or Wo Cm  Result Date: 04/29/2018 CLINICAL DATA:  Dyspnea. Left-sided chest/shoulder pain. High pretest probability for pulmonary embolus. EXAM: CT ANGIOGRAPHY CHEST WITH CONTRAST TECHNIQUE: Multidetector CT imaging of the chest was performed using the standard protocol during bolus administration of intravenous contrast. Multiplanar CT image reconstructions and MIPs were obtained to evaluate the vascular anatomy. CONTRAST:  ISOVUE-370 IOPAMIDOL (ISOVUE-370) INJECTION 76% COMPARISON:  Radiographs 04/29/2018.  Chest CT 01/14/2016 FINDINGS: Cardiovascular: There are no filling defects within the pulmonary arteries to suggest pulmonary embolus. Thoracic aorta is normal in caliber without dissection or aneurysm. The heart is normal in size. No pericardial effusion. Mediastinum/Nodes: Prominent right lower hilar lymph node measures 10 mm short axis, may be reactive. No mediastinal adenopathy. The esophagus is decompressed. No visualized thyroid nodule. Lungs/Pleura: Slight heterogeneous attenuation of  lung parenchyma. Areas of bronchial filling in the right lower lobe subsegmental branches, with small rounded intraluminal densities within the right middle lobe bronchus, right and left lower lobe bronchus. Bronchial thickening in the lower lobe bronchi with  scattered mucous plugging. Heterogeneous densities in the dependent lower lobes may be atelectasis or infectious. No pulmonary edema. No significant pleural fluid. Upper Abdomen: No acute abnormality. Musculoskeletal: Remote left rib fractures. Remote proximal left humerus fracture post ORIF. There are no acute or suspicious osseous abnormalities. Review of the MIP images confirms the above findings. IMPRESSION: 1. No pulmonary embolus. 2. Heterogeneous lung parenchyma can be seen in the setting of small airways disease. Bronchial thickening in the lower lobes, with areas of mucous plugging. Small rounded intraluminal densities in the right middle, right and left lower lobe bronchi, may be retained secretions or aspiration in the appropriate clinical setting. 3. Dependent lower lobe densities may be atelectasis or infectious pneumonitis, possibly related to bronchial thickening and mucous plugging. Electronically Signed   By: Rubye Oaks M.D.   On: 04/29/2018 22:30   Dg Foot 2 Views Right  Result Date: 04/29/2018 CLINICAL DATA:  Right foot pain EXAM: RIGHT FOOT - 2 VIEW COMPARISON:  None. FINDINGS: No fracture or malalignment. Joint spaces are maintained. Soft tissues are unremarkable. IMPRESSION: Negative. Electronically Signed   By: Jasmine Pang M.D.   On: 04/29/2018 19:41   [place] Ekg Site Rite  Result Date: 05/07/2018 If Site Rite image not attached, placement could not be confirmed due to current cardiac rhythm.    LOS: 11 days   Marcellus Scott, MD, FACP, [facility]. [facility] Pager [phone]  If 7PM-7AM, please contact night-coverage [email or web address] Password [facility] 05/11/2018, 6:32 PM

## 2018-05-11 NOTE — Progress Notes (Signed)
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 Repeat blood cultures from 5/21 are negative.  Renal function has remained stable at ~1.2 and has tolerated the amp/gent. A gent trough was obtained this morning for monitoring and resulted at 1.2. However, the trough was drawn early (9hrs after last dose) and the previous gent trough was appropriate with similar renal function, so no adjustments will be made. Would be appropriate to recheck a trough early next week.   Plan: Ampicillin 2g IV q4h Gentamicin 100mg  IV q12h for synergy dosing Plan for at least weekly GT to ensure staying <1 for synergy- watching renal function/UOP closely   Height: 5\' 9"  (175.3 cm) Weight: 183 lb 4.8 oz (83.1 kg) IBW/kg (Calculated) : 70.7  Temp (24hrs), Avg:97.9 F (36.6 C), Min:97.4 F (36.3 C), Max:98.2 F (36.8 C)  Recent Labs  Lab 05/05/18 0449 05/06/18 0604  05/07/18 1747 05/08/18 1810 05/09/18 0429 05/10/18 0435 05/11/18 0410  WBC 8.6 8.6  --   --   --  9.7  --   --   CREATININE 1.08 1.13   < > 1.15 1.23 1.23 1.23 1.21  GENTTROUGH  --   --   --   --  0.6  --   --   --   GENTRANDOM  --   --   --   --   --   --   --  1.2   < > = values in this interval not displayed.    Estimated Creatinine Clearance: 84.4 mL/min (by C-G formula based on SCr of 1.21 mg/dL).    No Known Allergies  Vancomycin 5/20 x1 Ampicillin 5/20>> Gentamicin 5/20>> *5/22 GT 0.5 *5/28 GT 0.6 *5/31 GT 1.2 (drawn early, similar renal fx to 5/28)  5/19 BCx: 2/2 enterococcus > pan sens 5/19 BCID: enterococcus, no resistance 5/21 BCx: negative   Jeffery Bennett PharmD PGY1 Pharmacy Practice Resident 05/11/2018 9:20 AM Phone: [phone]

## 2018-05-12 LAB — BASIC METABOLIC PANEL
Anion gap: 9 (ref 5–15)
BUN: 21 mg/dL — ABNORMAL HIGH (ref 6–20)
CALCIUM: 8.6 mg/dL — AB (ref 8.9–10.3)
CHLORIDE: 101 mmol/L (ref 101–111)
CO2: 28 mmol/L (ref 22–32)
CREATININE: 1.3 mg/dL — AB (ref 0.61–1.24)
GFR calc non Af Amer: >60 mL/min (ref >60)
Glucose, Bld: 110 mg/dL — ABNORMAL HIGH (ref 65–99)
Potassium: 3.8 mmol/L (ref 3.5–5.1)
SODIUM: 138 mmol/L (ref 135–145)

## 2018-05-12 MED ORDER — NICOTINE 21 MG/24HR TD PT24
21.0000 mg | MEDICATED_PATCH | Freq: Once | TRANSDERMAL | Status: AC
  Administered 2018-05-12: 21 mg via TRANSDERMAL
  Filled 2018-05-12: qty 1

## 2018-05-12 NOTE — Progress Notes (Signed)
PROGRESS NOTE        PATIENT DETAILS Name: Devoria AlbeBrian K Torrez Age: 37 y.o. Sex: male Date of Birth: October 14, 1981 Admit Date: 04/29/2018 Admitting Physician Eduard ClosArshad N Kakrakandy, MD ZOX:[id number]:Patient, No Pcp Per  Brief Narrative:  Patient is a 37 y.o. 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  Remains on empiric ampicillin and gentamicin-evaluated by thoracic surgery, with potential plans for aortic valve replacement and the next 1-2 weeks.  See below for further details  Subjective: Patient has no complaints today.   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 Orthopantogram also reveals some dental caries and periodontal disease for which dentist Dr. Robin SearingKulinsky plans surgical intervention mid next week.  Stable. As per ID follow-up, day 12 of amp/gent on 5/31.  Creatinine has slightly gone up from 1.2-1.3.  Management per pharmacy protocol.  IV drug use: Acknowledged using heroin 3 weeks back to prior MD, urine drug screen positive for cocaine as well.  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  It seems that he may be using all these illicit agents in spite of being on methadone as an outpatient.  Remains on methadone- EKG done 5/28 showed normal QTC.  He was counseled.  No change/stable.  NSVT/SVT-   TTE 5/24: LVEF 60-65%, severe AR, SBE on aortic valve likely large vegetation versus stone leaflet segments, severe AR.  Possibly due to endocarditis and severe AR.  Continue telemetry.  Blood pressures remain soft.  Consider low-dose  beta-blockers if blood pressures improve.  Electrolytes stable.  No further episodes noted since yesterday.  Chronic hepatitis C: Outpatient follow-up with infectious disease  Mild normocytic anemia: Mild-probably secondary to acute illness-doubt any further work-up is required-apart from periodic stable   DVT Prophylaxis: Prophylactic Lovenox   Code Status: Full code   Family Communication: None at bedside  Disposition Plan: Remain inpatient-will likely remain inpatient for several weeks to complete IV antibiotics/and aortic valve replacement  Antimicrobial agents: Anti-infectives (From admission, onward)   Start     Dose/Rate Route Frequency Ordered Stop   04/30/18 1800  gentamicin (GARAMYCIN) IVPB 100 mg     100 mg 200 mL/hr over 30 Minutes Intravenous Every 12 hours 04/30/18 1204     04/30/18 1200  vancomycin (VANCOCIN) IVPB 1000 mg/200 mL premix  Status:  Discontinued     1,000 mg 200 mL/hr over 60 Minutes Intravenous Every 8 hours 04/30/18 0237 04/30/18 1154   04/30/18 1200  ampicillin (OMNIPEN) 2 g in sodium chloride 0.9 % 100 mL IVPB     2 g 300 mL/hr over 20 Minutes Intravenous Every 4 hours 04/30/18 1154     04/30/18 0245  vancomycin (VANCOCIN) 1,500 mg in sodium chloride 0.9 % 500 mL IVPB     1,500 mg 250 mL/hr over 120 Minutes Intravenous  Once 04/30/18 0237 04/30/18 0555      Procedures: 5/24>>TEE  CONSULTS:  ID and TCTS, dental surgery  Time spent: 20 minutes  MEDICATIONS: Scheduled Meds: . enoxaparin (LOVENOX) injection  40 mg Subcutaneous Q24H  . methadone  100 mg Oral Daily  . nicotine  21 mg Transdermal Daily  . nicotine  21 mg Transdermal Once   Continuous Infusions: . ampicillin (OMNIPEN) IV Stopped (05/12/18 1248)  . gentamicin Stopped (05/12/18 0616)   PRN Meds:.acetaminophen **OR** acetaminophen, ondansetron **OR** ondansetron (ZOFRAN) IV, sodium chloride flush   PHYSICAL EXAM: Vital signs: Vitals:   05/11/18 1627 05/11/18 2018  05/12/18 0549 05/12/18 0948  BP: (!) 102/54 127/67 110/63 103/64  Pulse: 90 96 79 84  Resp: 16 16 16 18   Temp: 98.3 F (36.8 C) 98.3 F (36.8 C) 98.1 F (36.7 C) 97.8 F (36.6 C)  TempSrc: Oral Oral Oral Oral  SpO2: 99% 99% 98% 98%  Weight:  83.2 kg (183 lb 8 oz)    Height:       Filed Weights   05/09/18 2001 05/10/18 2240 05/11/18 2018  Weight: 78.9 kg (173 lb 15.1 oz) 83.1 kg (183 lb 4.8 oz) 83.2 kg (183 lb 8 oz)   Body mass index is 27.1 kg/m.   Exam: Patient remained stable without change in exam over the last 3 days.  General exam: Pleasant young male, well-built and nourished, sitting up comfortably in bed. RS: Clear to auscultation.  No increased work of breathing. CVS: S1 and S2 heard, RRR.  No JVD.  Mid systolic and early diastolic murmurs best heard at apex.  Telemetry personally reviewed: Sinus rhythm.  No NSVT noted. GI: Abdomen nondistended, soft and nontender.  Normal bowel sounds heard. CNS: Alert and oriented.  No focal neurological deficits. Extremities: Symmetric 5 x 5 power. Osler's node in the right second toe stable Oral cavity: Multiple missing teeth.  Caries +.   I have personally reviewed following labs and imaging studies  LABORATORY DATA: CBC: Recent Labs  Lab 05/06/18 0604 05/09/18 0429  WBC 8.6 9.7  HGB 10.4* 10.2*  HCT 33.6* 32.9*  MCV 83.0 83.3  PLT 340 338    Basic Metabolic Panel: Recent Labs  Lab 05/07/18 1747 05/08/18 1810 05/09/18 0429 05/10/18 0435 05/11/18 0410 05/12/18 0219  NA 139  --  138 140 138 138  K 4.0  --  4.1 4.3 4.1 3.8  CL 100*  --  101 100* 104 101  CO2 30  --  30 28 30 28   GLUCOSE 110*  --  107* 93 110* 110*  BUN 10  --  17 18 22* 21*  CREATININE 1.15 1.23 1.23 1.23 1.21 1.30*  CALCIUM 9.0  --  8.9 9.2 8.5* 8.6*  MG 2.1  --  2.1  --   --   --     GFR: Estimated Creatinine Clearance: 78.6 mL/min (A) (by C-G formula based on SCr of 1.3 mg/dL (H)).  Coagulation Profile: Recent Labs  Lab  05/09/18 0429  INR 0.96     Urine analysis:    Component Value Date/Time   COLORURINE YELLOW 04/30/2018 0024   APPEARANCEUR CLEAR 04/30/2018 0024   LABSPEC >1.046 (H) 04/30/2018 0024   PHURINE 5.0 04/30/2018 0024   GLUCOSEU NEGATIVE 04/30/2018 0024   HGBUR NEGATIVE 04/30/2018 0024   BILIRUBINUR NEGATIVE 04/30/2018 0024   KETONESUR NEGATIVE 04/30/2018 0024   PROTEINUR NEGATIVE 04/30/2018 0024   UROBILINOGEN 0.2 10/05/2009 1913   NITRITE NEGATIVE 04/30/2018 0024   LEUKOCYTESUR NEGATIVE 04/30/2018 0024    Sepsis Labs: Lactic Acid, Venous    Component Value Date/Time   LATICACIDVEN 0.73 11/10/2016 1321  MICROBIOLOGY: No results found for this or any previous visit (from the past 240 hour(s)).  RADIOLOGY STUDIES/RESULTS: Dg Orthopantogram  Result Date: 05/04/2018 CLINICAL DATA:  Bottom right and top left tooth pain. EXAM: ORTHOPANTOGRAM/PANORAMIC COMPARISON:  None. FINDINGS: Dental caries of the right lower first and third molars are suggested characterized by tooth deformity and lucencies. Periapical lucencies consistent with diffuse periodontal disease more notably about the right and left canines and bilateral upper lateral incisors. Dental caries of the left upper remaining molar. IMPRESSION: Dental caries as above with scattered areas of periapical lucencies compatible with periodontal disease. Electronically Signed   By: Tollie Eth M.D.   On: 05/04/2018 22:44   Dg Chest 2 View  Result Date: 04/29/2018 CLINICAL DATA:  Patient with left shoulder pain. EXAM: CHEST - 2 VIEW COMPARISON:  Chest radiograph 01/21/2016. FINDINGS: Stable cardiac and mediastinal contours. Heterogeneous opacities right lung base. No pleural effusion or pneumothorax. Thoracic spine degenerative changes. Surgical hardware left humerus. IMPRESSION: Heterogeneous opacities right lung base may represent scarring. Infection not excluded. Electronically Signed   By: Annia Belt M.D.   On: 04/29/2018 17:20    Ct Angio Chest Pe W/cm &/or Wo Cm  Result Date: 04/29/2018 CLINICAL DATA:  Dyspnea. Left-sided chest/shoulder pain. High pretest probability for pulmonary embolus. EXAM: CT ANGIOGRAPHY CHEST WITH CONTRAST TECHNIQUE: Multidetector CT imaging of the chest was performed using the standard protocol during bolus administration of intravenous contrast. Multiplanar CT image reconstructions and MIPs were obtained to evaluate the vascular anatomy. CONTRAST:  ISOVUE-370 IOPAMIDOL (ISOVUE-370) INJECTION 76% COMPARISON:  Radiographs 04/29/2018.  Chest CT 01/14/2016 FINDINGS: Cardiovascular: There are no filling defects within the pulmonary arteries to suggest pulmonary embolus. Thoracic aorta is normal in caliber without dissection or aneurysm. The heart is normal in size. No pericardial effusion. Mediastinum/Nodes: Prominent right lower hilar lymph node measures 10 mm short axis, may be reactive. No mediastinal adenopathy. The esophagus is decompressed. No visualized thyroid nodule. Lungs/Pleura: Slight heterogeneous attenuation of lung parenchyma. Areas of bronchial filling in the right lower lobe subsegmental branches, with small rounded intraluminal densities within the right middle lobe bronchus, right and left lower lobe bronchus. Bronchial thickening in the lower lobe bronchi with scattered mucous plugging. Heterogeneous densities in the dependent lower lobes may be atelectasis or infectious. No pulmonary edema. No significant pleural fluid. Upper Abdomen: No acute abnormality. Musculoskeletal: Remote left rib fractures. Remote proximal left humerus fracture post ORIF. There are no acute or suspicious osseous abnormalities. Review of the MIP images confirms the above findings. IMPRESSION: 1. No pulmonary embolus. 2. Heterogeneous lung parenchyma can be seen in the setting of small airways disease. Bronchial thickening in the lower lobes, with areas of mucous plugging. Small rounded intraluminal densities in  the right middle, right and left lower lobe bronchi, may be retained secretions or aspiration in the appropriate clinical setting. 3. Dependent lower lobe densities may be atelectasis or infectious pneumonitis, possibly related to bronchial thickening and mucous plugging. Electronically Signed   By: Rubye Oaks M.D.   On: 04/29/2018 22:30   Dg Foot 2 Views Right  Result Date: 04/29/2018 CLINICAL DATA:  Right foot pain EXAM: RIGHT FOOT - 2 VIEW COMPARISON:  None. FINDINGS: No fracture or malalignment. Joint spaces are maintained. Soft tissues are unremarkable. IMPRESSION: Negative. Electronically Signed   By: Jasmine Pang M.D.   On: 04/29/2018 19:41   [place] Ekg Site Rite  Result Date: 05/07/2018 If Site Rite image not attached, placement could not be confirmed due  to current cardiac rhythm.    LOS: 12 days   Marcellus Scott, MD, FACP, [facility]. [facility] Pager [phone]  If 7PM-7AM, please contact night-coverage [email or web address] Password [facility] 05/12/2018, 2:39 PM

## 2018-05-13 ENCOUNTER — Other Ambulatory Visit (HOSPITAL_COMMUNITY): Payer: Self-pay

## 2018-05-13 DIAGNOSIS — F1120 Opioid dependence, uncomplicated: Secondary | ICD-10-CM

## 2018-05-13 DIAGNOSIS — K0889 Other specified disorders of teeth and supporting structures: Secondary | ICD-10-CM

## 2018-05-13 LAB — BASIC METABOLIC PANEL
ANION GAP: 9 (ref 5–15)
BUN: 19 mg/dL (ref 6–20)
CALCIUM: 8.8 mg/dL — AB (ref 8.9–10.3)
CHLORIDE: 101 mmol/L (ref 101–111)
CO2: 28 mmol/L (ref 22–32)
Creatinine, Ser: 1.18 mg/dL (ref 0.61–1.24)
GFR calc non Af Amer: >60 mL/min (ref >60)
GLUCOSE: 106 mg/dL — AB (ref 65–99)
Potassium: 3.6 mmol/L (ref 3.5–5.1)
Sodium: 138 mmol/L (ref 135–145)

## 2018-05-13 MED ORDER — CHLORHEXIDINE GLUCONATE CLOTH 2 % EX PADS
6.0000 | MEDICATED_PAD | Freq: Every day | CUTANEOUS | Status: DC
  Administered 2018-05-13 – 2018-05-18 (×5): 6 via TOPICAL

## 2018-05-13 NOTE — Progress Notes (Signed)
    [facility]    Date of Admission:  04/29/2018   Total days of antibiotics 15        Day 14 of amp/gent           ID: Jeffery AlbeBrian K Anselmi is a 37 y.o. 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 Chlorhexidine Gluconate Cloth  6 each Topical Q0600  . enoxaparin (LOVENOX) injection  40 mg Subcutaneous Q24H  . methadone  100 mg Oral Daily  . nicotine  21 mg Transdermal Daily    Objective: Vital signs in last 24 hours: Temp:  [97.9 F (36.6 C)-98.1 F (36.7 C)] 97.9 F (36.6 C) (06/02 0940) Pulse Rate:  [68-82] 75 (06/02 0940) Resp:  [18-20] 20 (06/02 0940) BP: (95-118)/(39-69) 104/54 (06/02 0948) SpO2:  [98 %-99 %] 98 % (06/02 0940) Weight:  [183 lb 8.1 oz (83.2 kg)] 183 lb 8.1 oz (83.2 kg) (06/01 2033) Physical Exam  Constitutional: He is oriented to person, place, and time. He appears well-developed and well-nourished. No distress.  HENT:  Mouth/Throat: Oropharynx is clear and moist. No oropharyngeal exudate.  Cardiovascular: Normal rate, regular rhythm and normal heart sounds. Systolic murmur Pulmonary/Chest: Effort normal and breath sounds normal. No respiratory distress. He has no wheezes.  Abdominal: Soft. Bowel sounds are normal. He exhibits no distension. There is no tenderness.  Lymphadenopathy:  He has no cervical adenopathy.  Neurological: He is alert and oriented to person, place, and time.  Skin: Skin is warm and dry. No rash noted. No erythema. Osler's nodes resolved from admit Psychiatric: He has a normal mood and affect. His behavior is normal.     Lab Results Recent Labs    05/12/18 0219 05/13/18 0454  NA 138 138  K  3.8 3.6  CL 101 101  CO2 28 28  BUN 21* 19  CREATININE 1.30* 1.18    Microbiology: 5/21blood cx ngtd 5/19 blood cx enterococcal   Assessment/Plan: Enterococcal AV endocarditis = work up thus far showing bad dentition for which dr Kristin Bruinskulinski will be doing teeth extraction on Wednesday. Dr Laneta SimmersBartle to re-evaluate when to have valve replacement., possibly at end of the week per patient report  Recommend to restart abtx clock once he has valve replacement Will need to send valve tissue for culture to see if eradicating his infection Would push amp/gent til valve surgery, may decide to switch to amp/ceftriaxone if he has AKI  Chronic hep C = likely to treat once he recovers from his AV replacement - recommend to get Hep A total ab. - also recommend to get hep C genotype - recommend to give Hep B vaccine #1  Opiate dependence = currently on methadone  Dr comer to take over from ID standpoint.  [facility]yn[facility] Drezden [facility] Cell: [phone] Pager: [phone]  05/13/2018, 10:02 AM

## 2018-05-13 NOTE — Progress Notes (Signed)
Patient was suspected for smoking activity, security notified.  They went into the room and came out with 2 cigarettes, when asked they said bathroom window was up with fragrance bottle aside and could definitely smell tobacco.  Notified Dr. Waymon AmatoHongalgi and ordered Telemonitoring at bedside.  Will continue to monitor.

## 2018-05-13 NOTE — Progress Notes (Signed)
Patient refusing Telemonitoring, he wants to leave if he is being monitored.  Notified Dr. Waymon AmatoHongalgi.

## 2018-05-13 NOTE — Progress Notes (Addendum)
PROGRESS NOTE        PATIENT DETAILS Name: Jeffery Bennett Age: 37 y.o. Sex: male Date of Birth: 20-Mar-1981 Admit Date: 04/29/2018 Admitting Physician Eduard Clos, MD ONG:[id number], No Pcp Per  Brief Narrative:  Patient is a 37 y.o. 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  Remains on empiric ampicillin and gentamicin-evaluated by thoracic surgery, with potential plans for aortic valve replacement and the next 1-2 weeks.  See below for further details  Subjective: Patient denies complaints and is awaiting dental procedure next week.  As per RN, no acute issues reported.   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 Orthopantogram also reveals some dental caries and periodontal disease for which dentist Dr. Robin Searing plans surgical intervention mid next week.  Stable. As per ID follow-up, day 12 of amp/gent on 5/31.  Creatinine which had gone up to 1.3 has normalized again.  Management per pharmacy protocol.  IV drug use: Acknowledged using heroin 3 weeks back to prior MD, urine drug screen positive for cocaine as well.  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  It seems that he may be using all these illicit agents in spite of being on methadone as an outpatient.  Remains on methadone- EKG done 5/28 showed normal QTC.  He was counseled.  No change/stable.  NSVT/SVT-   TTE 5/24: LVEF 60-65%, severe AR, SBE on aortic valve likely large vegetation versus stone leaflet segments, severe AR.  Possibly due to endocarditis  and severe AR.  Continue telemetry.  Blood pressures remain soft.  Consider low-dose beta-blockers if blood pressures improve.  Electrolytes stable.  No further episodes noted since yesterday.  Chronic hepatitis C: Outpatient follow-up with infectious disease  Mild normocytic anemia: Mild-probably secondary to acute illness-doubt any further work-up is required-apart from periodic stable   DVT Prophylaxis: Prophylactic Lovenox   Code Status: Full code   Family Communication: None at bedside  Disposition Plan: Remain inpatient-will likely remain inpatient for several weeks to complete IV antibiotics/and aortic valve replacement  Antimicrobial agents: Anti-infectives (From admission, onward)   Start     Dose/Rate Route Frequency Ordered Stop   04/30/18 1800  gentamicin (GARAMYCIN) IVPB 100 mg     100 mg 200 mL/hr over 30 Minutes Intravenous Every 12 hours 04/30/18 1204     04/30/18 1200  vancomycin (VANCOCIN) IVPB 1000 mg/200 mL premix  Status:  Discontinued     1,000 mg 200 mL/hr over 60 Minutes Intravenous Every 8 hours 04/30/18 0237 04/30/18 1154   04/30/18 1200  ampicillin (OMNIPEN) 2 g in sodium chloride 0.9 % 100 mL IVPB     2 g 300 mL/hr over 20 Minutes Intravenous Every 4 hours 04/30/18 1154     04/30/18 0245  vancomycin (VANCOCIN) 1,500 mg in sodium chloride 0.9 % 500 mL IVPB     1,500 mg 250 mL/hr over 120 Minutes Intravenous  Once 04/30/18 0237 04/30/18 0555  Procedures: 5/24>>TEE  CONSULTS:  ID and TCTS, dental surgery  Time spent: 20 minutes  MEDICATIONS: Scheduled Meds: . Chlorhexidine Gluconate Cloth  6 each Topical Q0600  . enoxaparin (LOVENOX) injection  40 mg Subcutaneous Q24H  . methadone  100 mg Oral Daily  . nicotine  21 mg Transdermal Daily   Continuous Infusions: . ampicillin (OMNIPEN) IV Stopped (05/13/18 0839)  . gentamicin Stopped (05/13/18 0640)   PRN Meds:.acetaminophen **OR** acetaminophen, ondansetron **OR** ondansetron (ZOFRAN)  IV, sodium chloride flush   PHYSICAL EXAM: Vital signs: Vitals:   05/12/18 2033 05/13/18 0559 05/13/18 0940 05/13/18 0948  BP: (!) 110/51 95/69 (!) 100/39 (!) 104/54  Pulse: 68 82 75   Resp: 18 18 20    Temp: 97.9 F (36.6 C) 98 F (36.7 C) 97.9 F (36.6 C)   TempSrc: Oral Oral Oral   SpO2: 98% 98% 98%   Weight: 83.2 kg (183 lb 8.1 oz)     Height:       Filed Weights   05/10/18 2240 05/11/18 2018 05/12/18 2033  Weight: 83.1 kg (183 lb 4.8 oz) 83.2 kg (183 lb 8 oz) 83.2 kg (183 lb 8.1 oz)   Body mass index is 27.1 kg/m.   Exam: Exam changes remain stable without change over the last several days.  General exam: Pleasant young male, well-built and nourished, sitting up comfortably in bed. RS: Clear to auscultation.  No increased work of breathing. CVS: S1 and S2 heard, RRR.  No JVD.  Mid systolic and early diastolic murmurs best heard at apex.  Telemetry personally reviewed: Sinus rhythm.  Discontinued 6/2. GI: Abdomen nondistended, soft and nontender.  Normal bowel sounds heard. CNS: Alert and oriented.  No focal neurological deficits. Extremities: Symmetric 5 x 5 power. Osler's node in the right second toe stable Oral cavity: Multiple missing teeth.  Caries +.   I have personally reviewed following labs and imaging studies  LABORATORY DATA: CBC: Recent Labs  Lab 05/09/18 0429  WBC 9.7  HGB 10.2*  HCT 32.9*  MCV 83.3  PLT 338    Basic Metabolic Panel: Recent Labs  Lab 05/07/18 1747  05/09/18 0429 05/10/18 0435 05/11/18 0410 05/12/18 0219 05/13/18 0454  NA 139  --  138 140 138 138 138  K 4.0  --  4.1 4.3 4.1 3.8 3.6  CL 100*  --  101 100* 104 101 101  CO2 30  --  30 28 30 28 28   GLUCOSE 110*  --  107* 93 110* 110* 106*  BUN 10  --  17 18 22* 21* 19  CREATININE 1.15   < > 1.23 1.23 1.21 1.30* 1.18  CALCIUM 9.0  --  8.9 9.2 8.5* 8.6* 8.8*  MG 2.1  --  2.1  --   --   --   --    < > = values in this interval not displayed.    GFR: Estimated Creatinine  Clearance: 86.5 mL/min (by C-G formula based on SCr of 1.18 mg/dL).  Coagulation Profile: Recent Labs  Lab 05/09/18 0429  INR 0.96     Urine analysis:    Component Value Date/Time   COLORURINE YELLOW 04/30/2018 0024   APPEARANCEUR CLEAR 04/30/2018 0024   LABSPEC >1.046 (H) 04/30/2018 0024   PHURINE 5.0 04/30/2018 0024   GLUCOSEU NEGATIVE 04/30/2018 0024   HGBUR NEGATIVE 04/30/2018 0024   BILIRUBINUR NEGATIVE 04/30/2018 0024   KETONESUR NEGATIVE 04/30/2018 0024   PROTEINUR NEGATIVE 04/30/2018 0024   UROBILINOGEN 0.2 10/05/2009 1913  NITRITE NEGATIVE 04/30/2018 0024   LEUKOCYTESUR NEGATIVE 04/30/2018 0024    Sepsis Labs: Lactic Acid, Venous    Component Value Date/Time   LATICACIDVEN 0.73 11/10/2016 1321    MICROBIOLOGY: No results found for this or any previous visit (from the past 240 hour(s)).  RADIOLOGY STUDIES/RESULTS: Dg Orthopantogram  Result Date: 05/04/2018 CLINICAL DATA:  Bottom right and top left tooth pain. EXAM: ORTHOPANTOGRAM/PANORAMIC COMPARISON:  None. FINDINGS: Dental caries of the right lower first and third molars are suggested characterized by tooth deformity and lucencies. Periapical lucencies consistent with diffuse periodontal disease more notably about the right and left canines and bilateral upper lateral incisors. Dental caries of the left upper remaining molar. IMPRESSION: Dental caries as above with scattered areas of periapical lucencies compatible with periodontal disease. Electronically Signed   By: Tollie Eth M.D.   On: 05/04/2018 22:44   Dg Chest 2 View  Result Date: 04/29/2018 CLINICAL DATA:  Patient with left shoulder pain. EXAM: CHEST - 2 VIEW COMPARISON:  Chest radiograph 01/21/2016. FINDINGS: Stable cardiac and mediastinal contours. Heterogeneous opacities right lung base. No pleural effusion or pneumothorax. Thoracic spine degenerative changes. Surgical hardware left humerus. IMPRESSION: Heterogeneous opacities right lung base may  represent scarring. Infection not excluded. Electronically Signed   By: Annia Belt M.D.   On: 04/29/2018 17:20   Ct Angio Chest Pe W/cm &/or Wo Cm  Result Date: 04/29/2018 CLINICAL DATA:  Dyspnea. Left-sided chest/shoulder pain. High pretest probability for pulmonary embolus. EXAM: CT ANGIOGRAPHY CHEST WITH CONTRAST TECHNIQUE: Multidetector CT imaging of the chest was performed using the standard protocol during bolus administration of intravenous contrast. Multiplanar CT image reconstructions and MIPs were obtained to evaluate the vascular anatomy. CONTRAST:  ISOVUE-370 IOPAMIDOL (ISOVUE-370) INJECTION 76% COMPARISON:  Radiographs 04/29/2018.  Chest CT 01/14/2016 FINDINGS: Cardiovascular: There are no filling defects within the pulmonary arteries to suggest pulmonary embolus. Thoracic aorta is normal in caliber without dissection or aneurysm. The heart is normal in size. No pericardial effusion. Mediastinum/Nodes: Prominent right lower hilar lymph node measures 10 mm short axis, may be reactive. No mediastinal adenopathy. The esophagus is decompressed. No visualized thyroid nodule. Lungs/Pleura: Slight heterogeneous attenuation of lung parenchyma. Areas of bronchial filling in the right lower lobe subsegmental branches, with small rounded intraluminal densities within the right middle lobe bronchus, right and left lower lobe bronchus. Bronchial thickening in the lower lobe bronchi with scattered mucous plugging. Heterogeneous densities in the dependent lower lobes may be atelectasis or infectious. No pulmonary edema. No significant pleural fluid. Upper Abdomen: No acute abnormality. Musculoskeletal: Remote left rib fractures. Remote proximal left humerus fracture post ORIF. There are no acute or suspicious osseous abnormalities. Review of the MIP images confirms the above findings. IMPRESSION: 1. No pulmonary embolus. 2. Heterogeneous lung parenchyma can be seen in the setting of small airways disease.  Bronchial thickening in the lower lobes, with areas of mucous plugging. Small rounded intraluminal densities in the right middle, right and left lower lobe bronchi, may be retained secretions or aspiration in the appropriate clinical setting. 3. Dependent lower lobe densities may be atelectasis or infectious pneumonitis, possibly related to bronchial thickening and mucous plugging. Electronically Signed   By: Rubye Oaks M.D.   On: 04/29/2018 22:30   Dg Foot 2 Views Right  Result Date: 04/29/2018 CLINICAL DATA:  Right foot pain EXAM: RIGHT FOOT - 2 VIEW COMPARISON:  None. FINDINGS: No fracture or malalignment. Joint spaces are maintained. Soft tissues are unremarkable. IMPRESSION: Negative. Electronically Signed  By: Jasmine PangKim  Fujinaga M.D.   On: 04/29/2018 19:41   [place] Ekg Site Rite  Result Date: 05/07/2018 If Site Rite image not attached, placement could not be confirmed due to current cardiac rhythm.    LOS: 13 days   Marcellus ScottAnand Nolton Denis, MD, FACP, [facility]. [facility] Pager [phone]  If 7PM-7AM, please contact night-coverage [email or web address] Password [email or web address] 05/13/2018, 11:11 AM

## 2018-05-13 NOTE — Progress Notes (Signed)
Discontinued the Tele order but patient was strictly warned regarding no smoking policy and will have to monitor if this repeats.  Patient verbalized understanding and said he wont smoke. Will continue to monitor.

## 2018-05-14 ENCOUNTER — Inpatient Hospital Stay (HOSPITAL_COMMUNITY): Payer: Self-pay

## 2018-05-14 DIAGNOSIS — Z01810 Encounter for preprocedural cardiovascular examination: Secondary | ICD-10-CM

## 2018-05-14 LAB — PULMONARY FUNCTION TEST
DL/VA % pred: 77 %
DL/VA: 3.57 ml/min/mmHg/L
DLCO COR % PRED: 73 %
DLCO UNC: 19.43 ml/min/mmHg
DLCO cor: 22.88 ml/min/mmHg
DLCO unc % pred: 62 %
FEF 25-75 POST: 5.75 L/s
FEF 25-75 PRE: 4.42 L/s
FEF2575-%Change-Post: 30 %
FEF2575-%PRED-PRE: 108 %
FEF2575-%Pred-Post: 141 %
FEV1-%Change-Post: 6 %
FEV1-%PRED-POST: 106 %
FEV1-%Pred-Pre: 100 %
FEV1-POST: 4.45 L
FEV1-Pre: 4.2 L
FEV1FVC-%Change-Post: 7 %
FEV1FVC-%Pred-Pre: 100 %
FEV6-%CHANGE-POST: -1 %
FEV6-%PRED-POST: 100 %
FEV6-%PRED-PRE: 101 %
FEV6-POST: 5.09 L
FEV6-Pre: 5.17 L
FEV6FVC-%PRED-POST: 102 %
FEV6FVC-%Pred-Pre: 102 %
FVC-%Change-Post: -1 %
FVC-%Pred-Post: 97 %
FVC-%Pred-Pre: 99 %
FVC-POST: 5.09 L
FVC-Pre: 5.17 L
POST FEV6/FVC RATIO: 100 %
Post FEV1/FVC ratio: 87 %
Pre FEV1/FVC ratio: 81 %
Pre FEV6/FVC Ratio: 100 %
RV % pred: 70 %
RV: 1.2 L
TLC % PRED: 98 %
TLC: 6.65 L

## 2018-05-14 LAB — BASIC METABOLIC PANEL
ANION GAP: 4 — AB (ref 5–15)
BUN: 14 mg/dL (ref 6–20)
CO2: 31 mmol/L (ref 22–32)
Calcium: 8.7 mg/dL — ABNORMAL LOW (ref 8.9–10.3)
Chloride: 105 mmol/L (ref 101–111)
Creatinine, Ser: 1.15 mg/dL (ref 0.61–1.24)
GFR calc Af Amer: >60 mL/min (ref >60)
GFR calc non Af Amer: >60 mL/min (ref >60)
GLUCOSE: 88 mg/dL (ref 65–99)
POTASSIUM: 4.1 mmol/L (ref 3.5–5.1)
SODIUM: 140 mmol/L (ref 135–145)

## 2018-05-14 LAB — GENTAMICIN LEVEL, TROUGH: Gentamicin Trough: 0.6 ug/mL (ref 0.5–2.0)

## 2018-05-14 MED ORDER — ENOXAPARIN SODIUM 40 MG/0.4ML ~~LOC~~ SOLN
40.0000 mg | SUBCUTANEOUS | Status: AC
  Administered 2018-05-14: 40 mg via SUBCUTANEOUS
  Filled 2018-05-14: qty 0.4

## 2018-05-14 MED ORDER — ALBUTEROL SULFATE (2.5 MG/3ML) 0.083% IN NEBU
2.5000 mg | INHALATION_SOLUTION | Freq: Once | RESPIRATORY_TRACT | Status: AC
  Administered 2018-05-14: 2.5 mg via RESPIRATORY_TRACT

## 2018-05-14 NOTE — Progress Notes (Signed)
Pre-CABG testing has been completed. 1-39% ICA stenosis bilaterally.  05/14/18 9:31 AM Olen CordialGreg Wilder Kurowski RVT

## 2018-05-14 NOTE — Progress Notes (Signed)
IV Team: Arrived to patient's room per consult order to disconnect tubing between doses of abx. Line was already capped. Site unremarkable.

## 2018-05-14 NOTE — Progress Notes (Signed)
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 Repeat blood cultures from 5/21 are negative. Gentamicin trough 0.6 mcg/ml   Plan: Gentamicin 100mg  IV q12h for synergy dosing Plan for at least weekly GT to ensure staying <1 for synergy- watching renal function/UOP closely   Height: 5\' 9"  (175.3 cm) Weight: 183 lb 8.1 oz (83.2 kg) IBW/kg (Calculated) : 70.7  Temp (24hrs), Avg:98.3 F (36.8 C), Min:98.1 F (36.7 C), Max:98.4 F (36.9 C)  Recent Labs  Lab 05/08/18 1810 05/09/18 0429 05/10/18 0435 05/11/18 0410 05/12/18 0219 05/13/18 0454 05/14/18 0411 05/14/18 1741  WBC  --  9.7  --   --   --   --   --   --   CREATININE 1.23 1.23 1.23 1.21 1.30* 1.18 1.15  --   GENTTROUGH 0.6  --   --   --   --   --   --  0.6  GENTRANDOM  --   --   --  1.2  --   --   --   --     Estimated Creatinine Clearance: 88.8 mL/min (by C-G formula based on SCr of 1.15 mg/dL).    No Known Allergies   Jeffery Bennett, Jeffery Bennett PharmD 05/14/2018 11:28 PM

## 2018-05-14 NOTE — Progress Notes (Signed)
PROGRESS NOTE        PATIENT DETAILS Name: MARKEE MATERA Age: 37 y.o. Sex: male Date of Birth: March 21, 1981 Admit Date: 04/29/2018 Admitting Physician Eduard Clos, MD ZOX:[id number], No Pcp Per  Brief Narrative:  Patient is a 37 y.o. 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  Remains on empiric ampicillin and gentamicin-evaluated by thoracic surgery, with potential plans for aortic valve replacement.  See below for further details  Subjective: Patient denies complaints.  Awaiting dental procedure on 6/5.  Noted to be smoking in the bathroom on 6/1.  Discussed with patient and he states he will not do it again.   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 Orthopantogram also reveals some dental caries and periodontal disease for which dentist Dr. Robin Searing plans surgical intervention 6/5.  Stable. As per ID follow-up, day 12 of amp/gent on 5/31.  Creatinine which had gone up to 1.3 has normalized again.  Management per pharmacy protocol.  Per thoracic surgery consult note from 5/24: Enterococcal endocarditis of the aortic valve with partial destruction of his valve leaflets and severe aortic insufficiency.  IV drug use: Acknowledged using heroin 3 weeks back to prior MD, urine drug screen positive for cocaine as well.  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  It seems that he may be using all these illicit agents in spite of being on methadone as an outpatient.  Remains on methadone- EKG done 5/28 showed  normal QTC.  He was counseled.  No change/stable.  NSVT/SVT-   TTE 5/24: LVEF 60-65%, severe AR, SBE on aortic valve likely large vegetation versus stone leaflet segments, severe AR.  Possibly due to endocarditis and severe AR.  Continue telemetry.  Blood pressures remain soft.  Consider low-dose beta-blockers if blood pressures improve.  Electrolytes stable.  Telemetry discontinued 6/2.  Chronic hepatitis C: Outpatient follow-up with infectious disease  Mild normocytic anemia: Mild-probably secondary to acute illness-doubt any further work-up is required-apart from periodic follow-up.  Stable   DVT Prophylaxis: Prophylactic Lovenox-as discussed with Dr. Kristin Bruins and pharmacist on 6/3, changed this to daily a.m. for today and tomorrow then hold for dental procedure.  Code Status: Full code   Family Communication: None at bedside  Disposition Plan: Remain inpatient-will likely remain inpatient for several weeks to complete IV antibiotics/and aortic valve replacement  Antimicrobial agents: Anti-infectives (From admission, onward)   Start     Dose/Rate Route Frequency Ordered Stop   04/30/18 1800  gentamicin (GARAMYCIN) IVPB 100 mg     100 mg 200 mL/hr over 30 Minutes Intravenous Every 12 hours 04/30/18 1204     04/30/18 1200  vancomycin (VANCOCIN) IVPB 1000 mg/200 mL premix  Status:  Discontinued     1,000 mg 200 mL/hr over 60 Minutes Intravenous Every 8 hours 04/30/18 0237 04/30/18 1154   04/30/18 1200  ampicillin (OMNIPEN) 2 g in sodium chloride 0.9 % 100 mL IVPB     2  g 300 mL/hr over 20 Minutes Intravenous Every 4 hours 04/30/18 1154     04/30/18 0245  vancomycin (VANCOCIN) 1,500 mg in sodium chloride 0.9 % 500 mL IVPB     1,500 mg 250 mL/hr over 120 Minutes Intravenous  Once 04/30/18 0237 04/30/18 0555      Procedures: 5/24>>TEE  CONSULTS:  ID and TCTS, dental surgery  Time spent: 20 minutes  MEDICATIONS: Scheduled Meds: . Chlorhexidine Gluconate Cloth  6 each  Topical Q0600  . enoxaparin (LOVENOX) injection  40 mg Subcutaneous Q24H  . methadone  100 mg Oral Daily  . nicotine  21 mg Transdermal Daily   Continuous Infusions: . ampicillin (OMNIPEN) IV Stopped (05/14/18 1314)  . gentamicin 100 mg (05/14/18 0552)   PRN Meds:.acetaminophen **OR** acetaminophen, ondansetron **OR** ondansetron (ZOFRAN) IV, sodium chloride flush   PHYSICAL EXAM: Vital signs: Vitals:   05/13/18 2034 05/14/18 0608 05/14/18 0844 05/14/18 1232  BP: 116/75 110/65 (!) 106/51 (!) 113/53  Pulse: 79 81 74 78  Resp: 18 18 20 16   Temp: 97.9 F (36.6 C) 98.4 F (36.9 C) 98.4 F (36.9 C) 98.1 F (36.7 C)  TempSrc: Oral Oral Oral Oral  SpO2: 100% 100% 98% 98%  Weight:      Height:       Filed Weights   05/10/18 2240 05/11/18 2018 05/12/18 2033  Weight: 83.1 kg (183 lb 4.8 oz) 83.2 kg (183 lb 8 oz) 83.2 kg (183 lb 8.1 oz)   Body mass index is 27.1 kg/m.   Exam: No change in clinical exam over the last several days.  General exam: Pleasant young male, well-built and nourished, sitting up comfortably in bed. RS: Clear to auscultation.  No increased work of breathing. CVS: S1 and S2 heard, RRR.  No JVD.  Mid systolic and early diastolic murmurs best heard at apex.  Telemetry personally reviewed: Sinus rhythm.  Discontinued 6/2. GI: Abdomen nondistended, soft and nontender.  Normal bowel sounds heard. CNS: Alert and oriented.  No focal neurological deficits. Extremities: Symmetric 5 x 5 power. Osler's node in the right second toe stable Oral cavity: Multiple missing teeth.  Caries +.   I have personally reviewed following labs and imaging studies  LABORATORY DATA: CBC: Recent Labs  Lab 05/09/18 0429  WBC 9.7  HGB 10.2*  HCT 32.9*  MCV 83.3  PLT 338    Basic Metabolic Panel: Recent Labs  Lab 05/07/18 1747  05/09/18 0429 05/10/18 0435 05/11/18 0410 05/12/18 0219 05/13/18 0454 05/14/18 0411  NA 139  --  138 140 138 138 138 140  K 4.0  --  4.1 4.3  4.1 3.8 3.6 4.1  CL 100*  --  101 100* 104 101 101 105  CO2 30  --  30 28 30 28 28 31   GLUCOSE 110*  --  107* 93 110* 110* 106* 88  BUN 10  --  17 18 22* 21* 19 14  CREATININE 1.15   < > 1.23 1.23 1.21 1.30* 1.18 1.15  CALCIUM 9.0  --  8.9 9.2 8.5* 8.6* 8.8* 8.7*  MG 2.1  --  2.1  --   --   --   --   --    < > = values in this interval not displayed.    GFR: Estimated Creatinine Clearance: 88.8 mL/min (by C-G formula based on SCr of 1.15 mg/dL).  Coagulation Profile: Recent Labs  Lab 05/09/18 0429  INR 0.96     Urine analysis:    Component  Value Date/Time   COLORURINE YELLOW 04/30/2018 0024   APPEARANCEUR CLEAR 04/30/2018 0024   LABSPEC >1.046 (H) 04/30/2018 0024   PHURINE 5.0 04/30/2018 0024   GLUCOSEU NEGATIVE 04/30/2018 0024   HGBUR NEGATIVE 04/30/2018 0024   BILIRUBINUR NEGATIVE 04/30/2018 0024   KETONESUR NEGATIVE 04/30/2018 0024   PROTEINUR NEGATIVE 04/30/2018 0024   UROBILINOGEN 0.2 10/05/2009 1913   NITRITE NEGATIVE 04/30/2018 0024   LEUKOCYTESUR NEGATIVE 04/30/2018 0024    Sepsis Labs: Lactic Acid, Venous    Component Value Date/Time   LATICACIDVEN 0.73 11/10/2016 1321    MICROBIOLOGY: No results found for this or any previous visit (from the past 240 hour(s)).  RADIOLOGY STUDIES/RESULTS: Dg Orthopantogram  Result Date: 05/04/2018 CLINICAL DATA:  Bottom right and top left tooth pain. EXAM: ORTHOPANTOGRAM/PANORAMIC COMPARISON:  None. FINDINGS: Dental caries of the right lower first and third molars are suggested characterized by tooth deformity and lucencies. Periapical lucencies consistent with diffuse periodontal disease more notably about the right and left canines and bilateral upper lateral incisors. Dental caries of the left upper remaining molar. IMPRESSION: Dental caries as above with scattered areas of periapical lucencies compatible with periodontal disease. Electronically Signed   By: Tollie Ethavid  Kwon M.D.   On: 05/04/2018 22:44   Dg Chest 2  View  Result Date: 04/29/2018 CLINICAL DATA:  Patient with left shoulder pain. EXAM: CHEST - 2 VIEW COMPARISON:  Chest radiograph 01/21/2016. FINDINGS: Stable cardiac and mediastinal contours. Heterogeneous opacities right lung base. No pleural effusion or pneumothorax. Thoracic spine degenerative changes. Surgical hardware left humerus. IMPRESSION: Heterogeneous opacities right lung base may represent scarring. Infection not excluded. Electronically Signed   By: Annia Beltrew  Davis M.D.   On: 04/29/2018 17:20   Ct Angio Chest Pe W/cm &/or Wo Cm  Result Date: 04/29/2018 CLINICAL DATA:  Dyspnea. Left-sided chest/shoulder pain. High pretest probability for pulmonary embolus. EXAM: CT ANGIOGRAPHY CHEST WITH CONTRAST TECHNIQUE: Multidetector CT imaging of the chest was performed using the standard protocol during bolus administration of intravenous contrast. Multiplanar CT image reconstructions and MIPs were obtained to evaluate the vascular anatomy. CONTRAST:  100mL ISOVUE-370 IOPAMIDOL (ISOVUE-370) INJECTION 76% COMPARISON:  Radiographs 04/29/2018.  Chest CT 01/14/2016 FINDINGS: Cardiovascular: There are no filling defects within the pulmonary arteries to suggest pulmonary embolus. Thoracic aorta is normal in caliber without dissection or aneurysm. The heart is normal in size. No pericardial effusion. Mediastinum/Nodes: Prominent right lower hilar lymph node measures 10 mm short axis, may be reactive. No mediastinal adenopathy. The esophagus is decompressed. No visualized thyroid nodule. Lungs/Pleura: Slight heterogeneous attenuation of lung parenchyma. Areas of bronchial filling in the right lower lobe subsegmental branches, with small rounded intraluminal densities within the right middle lobe bronchus, right and left lower lobe bronchus. Bronchial thickening in the lower lobe bronchi with scattered mucous plugging. Heterogeneous densities in the dependent lower lobes may be atelectasis or infectious. No pulmonary  edema. No significant pleural fluid. Upper Abdomen: No acute abnormality. Musculoskeletal: Remote left rib fractures. Remote proximal left humerus fracture post ORIF. There are no acute or suspicious osseous abnormalities. Review of the MIP images confirms the above findings. IMPRESSION: 1. No pulmonary embolus. 2. Heterogeneous lung parenchyma can be seen in the setting of small airways disease. Bronchial thickening in the lower lobes, with areas of mucous plugging. Small rounded intraluminal densities in the right middle, right and left lower lobe bronchi, may be retained secretions or aspiration in the appropriate clinical setting. 3. Dependent lower lobe densities may be atelectasis or infectious pneumonitis, possibly  related to bronchial thickening and mucous plugging. Electronically Signed   By: Rubye Oaks M.D.   On: 04/29/2018 22:30   Dg Foot 2 Views Right  Result Date: 04/29/2018 CLINICAL DATA:  Right foot pain EXAM: RIGHT FOOT - 2 VIEW COMPARISON:  None. FINDINGS: No fracture or malalignment. Joint spaces are maintained. Soft tissues are unremarkable. IMPRESSION: Negative. Electronically Signed   By: Jasmine Pang M.D.   On: 04/29/2018 19:41   [place] Ekg Site Rite  Result Date: 05/07/2018 If Site Rite image not attached, placement could not be confirmed due to current cardiac rhythm.    LOS: 14 days   Marcellus Scott, MD, FACP, [facility]. [facility] Pager [phone]  If 7PM-7AM, please contact night-coverage [email or web address] Password [email or web address] 05/14/2018, 2:59 PM

## 2018-05-15 ENCOUNTER — Encounter (HOSPITAL_COMMUNITY): Payer: Self-pay | Admitting: Anesthesiology

## 2018-05-15 DIAGNOSIS — B952 Enterococcus as the cause of diseases classified elsewhere: Secondary | ICD-10-CM

## 2018-05-15 DIAGNOSIS — I330 Acute and subacute infective endocarditis: Principal | ICD-10-CM

## 2018-05-15 DIAGNOSIS — B182 Chronic viral hepatitis C: Secondary | ICD-10-CM

## 2018-05-15 DIAGNOSIS — Z79899 Other long term (current) drug therapy: Secondary | ICD-10-CM

## 2018-05-15 LAB — BASIC METABOLIC PANEL
ANION GAP: 8 (ref 5–15)
BUN: 16 mg/dL (ref 6–20)
CALCIUM: 9 mg/dL (ref 8.9–10.3)
CO2: 30 mmol/L (ref 22–32)
CREATININE: 1.19 mg/dL (ref 0.61–1.24)
Chloride: 101 mmol/L (ref 101–111)
GFR calc Af Amer: >60 mL/min (ref >60)
Glucose, Bld: 96 mg/dL (ref 65–99)
Potassium: 4.2 mmol/L (ref 3.5–5.1)
Sodium: 139 mmol/L (ref 135–145)

## 2018-05-15 LAB — CBC
HCT: 32.2 % — ABNORMAL LOW (ref 39.0–52.0)
Hemoglobin: 10.1 g/dL — ABNORMAL LOW (ref 13.0–17.0)
MCH: 26.2 pg (ref 26.0–34.0)
MCHC: 31.4 g/dL (ref 30.0–36.0)
MCV: 83.4 fL (ref 78.0–100.0)
PLATELETS: 300 10*3/uL (ref 150–400)
RBC: 3.86 MIL/uL — ABNORMAL LOW (ref 4.22–5.81)
RDW: 14.1 % (ref 11.5–15.5)
WBC: 8 10*3/uL (ref 4.0–10.5)

## 2018-05-15 LAB — SURGICAL PCR SCREEN
MRSA, PCR: NEGATIVE
STAPHYLOCOCCUS AUREUS: NEGATIVE

## 2018-05-15 NOTE — Progress Notes (Signed)
    [facility]   Reason for visit: Follow up on enterococcal native valve endocarditis  Interval History: no associated rash, no diarrhea; WBC wnl, no fever; no complaints.  Dr. Robin SearingKulinsky to extract teeth tomorrow, surgery planned for Friday according to patient.    Physical Exam: Constitutional:  Vitals:   05/15/18 0457 05/15/18 1050  BP: (!) 108/47 (!) 94/43  Pulse: 79 71  Resp: 14 18  Temp: 97.8 F (36.6 C) 97.7 F (36.5 C)  SpO2: 98% 98%   patient appears in NAD Eyes: anicteric HENT: no thrush Respiratory: Normal respiratory effort; CTA B Cardiovascular: RRR with murmur GI: soft, nt, nd  Review of Systems: Constitutional: negative for fevers, chills and anorexia Gastrointestinal: negative for nausea and diarrhea Integument/breast: negative for rash Musculoskeletal: negative for myalgias and arthralgias  Lab Results  Component Value Date   WBC 8.0 05/15/2018   HGB 10.1 (L) 05/15/2018   HCT 32.2 (L) 05/15/2018   MCV 83.4 05/15/2018   PLT 300 05/15/2018    Lab Results  Component Value Date   CREATININE 1.19 05/15/2018   BUN 16 05/15/2018   NA 139 05/15/2018   K 4.2 05/15/2018   CL 101 05/15/2018   CO2 30 05/15/2018    Lab Results  Component Value Date   ALT 10 (L) 04/30/2018   AST 12 (L) 04/30/2018   ALKPHOS 66 04/30/2018     Microbiology: No results found for this or any previous visit (from the past 240 hour(s)).  Impression/Plan:  1. Enterococcal native valve endocarditis - aortic valve, septic emboli to lungs, feet with nodes.   Continue on amp/gent, no changes.  Surgery Friday per patient.    2.  Medication monitoring - creat stable at 1.19.  Continue to monitor on gentamicin.   3.  Bacteremia - repeat cultures have remained negative from 5/21.    4.  Chronic hepatitis C - will consider treatment for this as an outpatient.

## 2018-05-15 NOTE — Progress Notes (Signed)
PROGRESS NOTE        PATIENT DETAILS Name: Jeffery AlbeBrian K Brennan Age: 37 y.o. Sex: male Date of Birth: 01/03/1981 Admit Date: 04/29/2018 Admitting Physician Eduard ClosArshad N Kakrakandy, MD UJW:[id number]:Patient, No Pcp Per  Brief Narrative:  Patient is a 37 y.o. 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  Remains on empiric ampicillin and gentamicin-evaluated by thoracic surgery, with potential plans for aortic valve replacement.  See below for further details  Subjective: Denies complaints.   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 Orthopantogram also reveals some dental caries and periodontal disease for which dentist Dr. Robin SearingKulinsky plans surgical intervention 6/5.  Stable. As per ID follow-up, day 12 of amp/gent on 5/31.  Creatinine which had gone up to 1.3 has normalized again.  Management per pharmacy protocol.  Per thoracic surgery consult note from 5/24: Enterococcal endocarditis of the aortic valve with partial destruction of his valve leaflets and severe aortic insufficiency.  As per ID note, patient informed them that he supposed to have his cardiac surgery on 6/7.  IV drug use: Acknowledged using heroin 3 weeks back to prior MD, urine drug screen positive for cocaine as well.  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  It seems that he may be using all these illicit agents in spite of being on methadone as an outpatient.  Remains on methadone- EKG done 5/28 showed normal QTC.  He was counseled.  No  change/stable.  NSVT/SVT-   TTE 5/24: LVEF 60-65%, severe AR, SBE on aortic valve likely large vegetation versus stone leaflet segments, severe AR.  Possibly due to endocarditis and severe AR.  Stable.  Telemetry discontinued 6/2.  Chronic hepatitis C: Outpatient follow-up with infectious disease  Mild normocytic anemia: Mild-probably secondary to acute illness-doubt any further work-up is required-apart from periodic follow-up.  Stable   DVT Prophylaxis: Prophylactic Lovenox-as discussed with Dr. Kristin BruinsKulinski and pharmacist on 6/3, changed this to daily a.m. for today and tomorrow then hold for dental procedure.  Code Status: Full code   Family Communication: None at bedside  Disposition Plan: Remain inpatient-will likely remain inpatient for several weeks to complete IV antibiotics/and aortic valve replacement  Antimicrobial agents: Anti-infectives (From admission, onward)   Start     Dose/Rate Route Frequency Ordered Stop   04/30/18 1800  gentamicin (GARAMYCIN) IVPB 100 mg     100 mg 200 mL/hr over 30 Minutes Intravenous Every 12 hours 04/30/18 1204     04/30/18 1200  vancomycin (VANCOCIN) IVPB 1000 mg/200 mL premix  Status:  Discontinued     1,000 mg 200 mL/hr over 60 Minutes Intravenous Every 8 hours 04/30/18 0237 04/30/18 1154   04/30/18 1200  ampicillin (OMNIPEN) 2 g in sodium chloride 0.9 % 100 mL IVPB     2 g 300 mL/hr over 20 Minutes Intravenous Every 4 hours 04/30/18 1154     04/30/18 0245  vancomycin (VANCOCIN) 1,500 mg in sodium chloride 0.9 % 500  mL IVPB     1,500 mg 250 mL/hr over 120 Minutes Intravenous  Once 04/30/18 0237 04/30/18 0555      Procedures: 5/24>>TEE  CONSULTS:  ID and TCTS, dental surgery  Time spent: 20 minutes  MEDICATIONS: Scheduled Meds: . Chlorhexidine Gluconate Cloth  6 each Topical Q0600  . methadone  100 mg Oral Daily  . nicotine  21 mg Transdermal Daily   Continuous Infusions: . ampicillin (OMNIPEN) IV Stopped (05/15/18 1640)   . gentamicin Stopped (05/15/18 1658)   PRN Meds:.acetaminophen **OR** acetaminophen, ondansetron **OR** ondansetron (ZOFRAN) IV, sodium chloride flush   PHYSICAL EXAM: Vital signs: Vitals:   05/14/18 1758 05/14/18 2107 05/15/18 0457 05/15/18 1050  BP: (!) 107/49 (!) 116/59 (!) 108/47 (!) 94/43  Pulse: 74 75 79 71  Resp: 20 14 14 18   Temp: 98.2 F (36.8 C) 98.2 F (36.8 C) 97.8 F (36.6 C) 97.7 F (36.5 C)  TempSrc: Oral Oral Oral Oral  SpO2: 99% 98% 98% 98%  Weight:      Height:       Filed Weights   05/10/18 2240 05/11/18 2018 05/12/18 2033  Weight: 83.1 kg (183 lb 4.8 oz) 83.2 kg (183 lb 8 oz) 83.2 kg (183 lb 8.1 oz)   Body mass index is 27.1 kg/m.   Exam: No change in clinical exam over the last several days.  General exam: Pleasant young male, well-built and nourished, sitting up comfortably in bed. RS: Clear to auscultation.  No increased work of breathing. CVS: S1 and S2 heard, RRR.  No JVD.  Mid systolic and early diastolic murmurs best heard at apex.   GI: Abdomen nondistended, soft and nontender.  Normal bowel sounds heard. CNS: Alert and oriented.  No focal neurological deficits. Extremities: Symmetric 5 x 5 power. Osler's node in the right second toe stable Oral cavity: Multiple missing teeth.  Caries +.   I have personally reviewed following labs and imaging studies  LABORATORY DATA: CBC: Recent Labs  Lab 05/09/18 0429 05/15/18 0413  WBC 9.7 8.0  HGB 10.2* 10.1*  HCT 32.9* 32.2*  MCV 83.3 83.4  PLT 338 300    Basic Metabolic Panel: Recent Labs  Lab 05/09/18 0429  05/11/18 0410 05/12/18 0219 05/13/18 0454 05/14/18 0411 05/15/18 0413  NA 138   < > 138 138 138 140 139  K 4.1   < > 4.1 3.8 3.6 4.1 4.2  CL 101   < > 104 101 101 105 101  CO2 30   < > 30 28 28 31 30   GLUCOSE 107*   < > 110* 110* 106* 88 96  BUN 17   < > 22* 21* 19 14 16   CREATININE 1.23   < > 1.21 1.30* 1.18 1.15 1.19  CALCIUM 8.9   < > 8.5* 8.6* 8.8* 8.7* 9.0  MG 2.1  --    --   --   --   --   --    < > = values in this interval not displayed.    GFR: Estimated Creatinine Clearance: 85.8 mL/min (by C-G formula based on SCr of 1.19 mg/dL).  Coagulation Profile: Recent Labs  Lab 05/09/18 0429  INR 0.96     Urine analysis:    Component Value Date/Time   COLORURINE YELLOW 04/30/2018 0024   APPEARANCEUR CLEAR 04/30/2018 0024   LABSPEC >1.046 (H) 04/30/2018 0024   PHURINE 5.0 04/30/2018 0024   GLUCOSEU NEGATIVE 04/30/2018 0024   HGBUR NEGATIVE 04/30/2018 0024   BILIRUBINUR  NEGATIVE 04/30/2018 0024   KETONESUR NEGATIVE 04/30/2018 0024   PROTEINUR NEGATIVE 04/30/2018 0024   UROBILINOGEN 0.2 10/05/2009 1913   NITRITE NEGATIVE 04/30/2018 0024   LEUKOCYTESUR NEGATIVE 04/30/2018 0024    Sepsis Labs: Lactic Acid, Venous    Component Value Date/Time   LATICACIDVEN 0.73 11/10/2016 1321    MICROBIOLOGY: Recent Results (from the past 240 hour(s))  Surgical pcr screen     Status: None   Collection Time: 05/15/18 12:01 PM  Result Value Ref Range Status   MRSA, PCR NEGATIVE NEGATIVE Final   Staphylococcus aureus NEGATIVE NEGATIVE Final    Comment: (NOTE) The Xpert SA Assay (FDA approved for NASAL specimens in patients 72 years of age and older), is one component of a comprehensive surveillance program. It is not intended to diagnose infection nor to guide or monitor treatment. Performed at [facility], [address], [place], [place] [address]     RADIOLOGY STUDIES/RESULTS: Dg Orthopantogram  Result Date: 05/04/2018 CLINICAL DATA:  Bottom right and top left tooth pain. EXAM: ORTHOPANTOGRAM/PANORAMIC COMPARISON:  None. FINDINGS: Dental caries of the right lower first and third molars are suggested characterized by tooth deformity and lucencies. Periapical lucencies consistent with diffuse periodontal disease more notably about the right and left canines and bilateral upper lateral incisors. Dental caries of the left upper remaining molar.  IMPRESSION: Dental caries as above with scattered areas of periapical lucencies compatible with periodontal disease. Electronically Signed   By: Tollie Eth M.D.   On: 05/04/2018 22:44   Dg Chest 2 View  Result Date: 04/29/2018 CLINICAL DATA:  Patient with left shoulder pain. EXAM: CHEST - 2 VIEW COMPARISON:  Chest radiograph 01/21/2016. FINDINGS: Stable cardiac and mediastinal contours. Heterogeneous opacities right lung base. No pleural effusion or pneumothorax. Thoracic spine degenerative changes. Surgical hardware left humerus. IMPRESSION: Heterogeneous opacities right lung base may represent scarring. Infection not excluded. Electronically Signed   By: Annia Belt M.D.   On: 04/29/2018 17:20   Ct Angio Chest Pe W/cm &/or Wo Cm  Result Date: 04/29/2018 CLINICAL DATA:  Dyspnea. Left-sided chest/shoulder pain. High pretest probability for pulmonary embolus. EXAM: CT ANGIOGRAPHY CHEST WITH CONTRAST TECHNIQUE: Multidetector CT imaging of the chest was performed using the standard protocol during bolus administration of intravenous contrast. Multiplanar CT image reconstructions and MIPs were obtained to evaluate the vascular anatomy. CONTRAST:  ISOVUE-370 IOPAMIDOL (ISOVUE-370) INJECTION 76% COMPARISON:  Radiographs 04/29/2018.  Chest CT 01/14/2016 FINDINGS: Cardiovascular: There are no filling defects within the pulmonary arteries to suggest pulmonary embolus. Thoracic aorta is normal in caliber without dissection or aneurysm. The heart is normal in size. No pericardial effusion. Mediastinum/Nodes: Prominent right lower hilar lymph node measures 10 mm short axis, may be reactive. No mediastinal adenopathy. The esophagus is decompressed. No visualized thyroid nodule. Lungs/Pleura: Slight heterogeneous attenuation of lung parenchyma. Areas of bronchial filling in the right lower lobe subsegmental branches, with small rounded intraluminal densities within the right middle lobe bronchus, right and left  lower lobe bronchus. Bronchial thickening in the lower lobe bronchi with scattered mucous plugging. Heterogeneous densities in the dependent lower lobes may be atelectasis or infectious. No pulmonary edema. No significant pleural fluid. Upper Abdomen: No acute abnormality. Musculoskeletal: Remote left rib fractures. Remote proximal left humerus fracture post ORIF. There are no acute or suspicious osseous abnormalities. Review of the MIP images confirms the above findings. IMPRESSION: 1. No pulmonary embolus. 2. Heterogeneous lung parenchyma can be seen in the setting of small airways disease. Bronchial  thickening in the lower lobes, with areas of mucous plugging. Small rounded intraluminal densities in the right middle, right and left lower lobe bronchi, may be retained secretions or aspiration in the appropriate clinical setting. 3. Dependent lower lobe densities may be atelectasis or infectious pneumonitis, possibly related to bronchial thickening and mucous plugging. Electronically Signed   By: Rubye Oaks M.D.   On: 04/29/2018 22:30   Dg Foot 2 Views Right  Result Date: 04/29/2018 CLINICAL DATA:  Right foot pain EXAM: RIGHT FOOT - 2 VIEW COMPARISON:  None. FINDINGS: No fracture or malalignment. Joint spaces are maintained. Soft tissues are unremarkable. IMPRESSION: Negative. Electronically Signed   By: Jasmine Pang M.D.   On: 04/29/2018 19:41   [place] Ekg Site Rite  Result Date: 05/07/2018 If Site Rite image not attached, placement could not be confirmed due to current cardiac rhythm.    LOS: 15 days   Marcellus Scott, MD, FACP, [facility]. [facility] Pager [phone]  If 7PM-7AM, please contact night-coverage [email or web address] Password [email or web address] 05/15/2018, 5:11 PM
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
## 2018-05-16 ENCOUNTER — Encounter (HOSPITAL_COMMUNITY)
Admission: EM | Discharge status: Against Medical Advice | Payer: Self-pay | Source: Home / Self Care | Attending: Surgery

## 2018-05-16 ENCOUNTER — Inpatient Hospital Stay (HOSPITAL_COMMUNITY): Payer: Self-pay | Admitting: Anesthesiology

## 2018-05-16 HISTORY — PX: MULTIPLE EXTRACTIONS WITH ALVEOLOPLASTY: SHX5342

## 2018-05-16 LAB — BASIC METABOLIC PANEL
Anion gap: 5 (ref 5–15)
BUN: 15 mg/dL (ref 6–20)
CHLORIDE: 105 mmol/L (ref 101–111)
CO2: 28 mmol/L (ref 22–32)
Calcium: 8.5 mg/dL — ABNORMAL LOW (ref 8.9–10.3)
Creatinine, Ser: 1.22 mg/dL (ref 0.61–1.24)
GFR calc Af Amer: >60 mL/min (ref >60)
GFR calc non Af Amer: >60 mL/min (ref >60)
Glucose, Bld: 99 mg/dL (ref 65–99)
POTASSIUM: 4.1 mmol/L (ref 3.5–5.1)
Sodium: 138 mmol/L (ref 135–145)

## 2018-05-16 SURGERY — MULTIPLE EXTRACTION WITH ALVEOLOPLASTY
Anesthesia: General

## 2018-05-16 MED ORDER — FENTANYL CITRATE (PF) 250 MCG/5ML IJ SOLN
INTRAMUSCULAR | Status: DC | PRN
  Administered 2018-05-16: 150 ug via INTRAVENOUS
  Administered 2018-05-16 (×2): 50 ug via INTRAVENOUS
  Administered 2018-05-16: 100 ug via INTRAVENOUS

## 2018-05-16 MED ORDER — DEXAMETHASONE SODIUM PHOSPHATE 10 MG/ML IJ SOLN
INTRAMUSCULAR | Status: AC
  Filled 2018-05-16: qty 2

## 2018-05-16 MED ORDER — LIDOCAINE 2% (20 MG/ML) 5 ML SYRINGE
INTRAMUSCULAR | Status: DC | PRN
  Administered 2018-05-16: 100 mg via INTRAVENOUS

## 2018-05-16 MED ORDER — LACTATED RINGERS IV SOLN: INTRAVENOUS | Status: DC

## 2018-05-16 MED ORDER — MIDAZOLAM HCL 2 MG/2ML IJ SOLN
INTRAMUSCULAR | Status: AC
  Filled 2018-05-16: qty 2

## 2018-05-16 MED ORDER — ROCURONIUM BROMIDE 10 MG/ML (PF) SYRINGE
PREFILLED_SYRINGE | INTRAVENOUS | Status: DC | PRN
  Administered 2018-05-16: 50 mg via INTRAVENOUS

## 2018-05-16 MED ORDER — 0.9 % SODIUM CHLORIDE (POUR BTL) OPTIME
TOPICAL | Status: DC | PRN
  Administered 2018-05-16: 1000 mL

## 2018-05-16 MED ORDER — ONDANSETRON HCL 4 MG/2ML IJ SOLN
INTRAMUSCULAR | Status: AC
  Filled 2018-05-16: qty 2

## 2018-05-16 MED ORDER — OXYMETAZOLINE HCL 0.05 % NA SOLN
NASAL | Status: AC
  Filled 2018-05-16: qty 15

## 2018-05-16 MED ORDER — OXYCODONE-ACETAMINOPHEN 5-325 MG PO TABS
1.0000 | ORAL_TABLET | ORAL | Status: DC | PRN
  Administered 2018-05-17 (×4): 2 via ORAL
  Administered 2018-05-18: 1 via ORAL
  Filled 2018-05-16 (×5): qty 2

## 2018-05-16 MED ORDER — MIDAZOLAM HCL 2 MG/2ML IJ SOLN
INTRAMUSCULAR | Status: DC | PRN
  Administered 2018-05-16 (×2): 1 mg via INTRAVENOUS

## 2018-05-16 MED ORDER — AMINOCAPROIC ACID SOLUTION 5% (50 MG/ML)
10.0000 mL | ORAL | Status: AC
  Administered 2018-05-16 (×3): 10 mL via ORAL
  Administered 2018-05-16: 20 mL via ORAL
  Administered 2018-05-16 (×4): 10 mL via ORAL
  Filled 2018-05-16: qty 100

## 2018-05-16 MED ORDER — BUPIVACAINE-EPINEPHRINE 0.5% -1:200000 IJ SOLN
INTRAMUSCULAR | Status: DC | PRN
  Administered 2018-05-16: 1.8 mL

## 2018-05-16 MED ORDER — ROCURONIUM BROMIDE 10 MG/ML (PF) SYRINGE
PREFILLED_SYRINGE | INTRAVENOUS | Status: AC
  Filled 2018-05-16: qty 10

## 2018-05-16 MED ORDER — BUPIVACAINE-EPINEPHRINE (PF) 0.5% -1:200000 IJ SOLN
INTRAMUSCULAR | Status: AC
  Filled 2018-05-16: qty 3.6

## 2018-05-16 MED ORDER — FENTANYL CITRATE (PF) 250 MCG/5ML IJ SOLN
INTRAMUSCULAR | Status: AC
  Filled 2018-05-16: qty 5

## 2018-05-16 MED ORDER — DEXAMETHASONE SODIUM PHOSPHATE 10 MG/ML IJ SOLN
INTRAMUSCULAR | Status: DC | PRN
  Administered 2018-05-16: 10 mg via INTRAVENOUS

## 2018-05-16 MED ORDER — LIDOCAINE-EPINEPHRINE 2 %-1:100000 IJ SOLN
INTRAMUSCULAR | Status: DC | PRN
  Administered 2018-05-16: 5.1 mL via INTRADERMAL

## 2018-05-16 MED ORDER — LIDOCAINE-EPINEPHRINE 2 %-1:100000 IJ SOLN
INTRAMUSCULAR | Status: AC
  Filled 2018-05-16: qty 10.2

## 2018-05-16 MED ORDER — DEXMEDETOMIDINE HCL IN NACL 200 MCG/50ML IV SOLN
INTRAVENOUS | Status: DC | PRN
  Administered 2018-05-16 (×5): 8 ug via INTRAVENOUS

## 2018-05-16 MED ORDER — SUGAMMADEX SODIUM 200 MG/2ML IV SOLN
INTRAVENOUS | Status: DC | PRN
  Administered 2018-05-16: 200 mg via INTRAVENOUS

## 2018-05-16 MED ORDER — PROPOFOL 10 MG/ML IV BOLUS
INTRAVENOUS | Status: AC
  Filled 2018-05-16: qty 20

## 2018-05-16 MED ORDER — LIDOCAINE 2% (20 MG/ML) 5 ML SYRINGE
INTRAMUSCULAR | Status: AC
  Filled 2018-05-16: qty 5

## 2018-05-16 MED ORDER — LACTATED RINGERS IV SOLN
INTRAVENOUS | Status: DC | PRN
  Administered 2018-05-16: 08:00:00 via INTRAVENOUS

## 2018-05-16 MED ORDER — DEXAMETHASONE SODIUM PHOSPHATE 10 MG/ML IJ SOLN
INTRAMUSCULAR | Status: AC
  Filled 2018-05-16: qty 1

## 2018-05-16 MED ORDER — ONDANSETRON HCL 4 MG/2ML IJ SOLN
INTRAMUSCULAR | Status: DC | PRN
  Administered 2018-05-16: 4 mg via INTRAVENOUS

## 2018-05-16 MED ORDER — HEMOSTATIC AGENTS (NO CHARGE) OPTIME
TOPICAL | Status: DC | PRN
  Administered 2018-05-16: 1 via TOPICAL

## 2018-05-16 MED ORDER — PROPOFOL 10 MG/ML IV BOLUS
INTRAVENOUS | Status: DC | PRN
  Administered 2018-05-16: 150 mg via INTRAVENOUS

## 2018-05-16 MED ORDER — SUGAMMADEX SODIUM 200 MG/2ML IV SOLN
INTRAVENOUS | Status: AC
  Filled 2018-05-16: qty 2

## 2018-05-16 MED ORDER — OXYMETAZOLINE HCL 0.05 % NA SOLN
NASAL | Status: DC | PRN
  Administered 2018-05-16: 1 via TOPICAL

## 2018-05-16 SURGICAL SUPPLY — 41 items
ALCOHOL 70% 16 OZ (MISCELLANEOUS) ×3 IMPLANT
ATTRACTOMAT 16X20 MAGNETIC DRP (DRAPES) ×3 IMPLANT
BANDAGE HEMOSTAT MRDH 4X4 STRL (MISCELLANEOUS) IMPLANT
BLADE SURG 15 STRL LF DISP TIS (BLADE) ×2 IMPLANT
BLADE SURG 15 STRL SS (BLADE) ×6
BNDG HEMOSTAT MRDH 4X4 STRL (MISCELLANEOUS) ×3
COVER SURGICAL LIGHT HANDLE (MISCELLANEOUS) ×3 IMPLANT
GAUZE PACKING FOLDED 2  STR (GAUZE/BANDAGES/DRESSINGS) ×2
GAUZE PACKING FOLDED 2 STR (GAUZE/BANDAGES/DRESSINGS) ×1 IMPLANT
GAUZE SPONGE 4X4 16PLY XRAY LF (GAUZE/BANDAGES/DRESSINGS) ×3 IMPLANT
GLOVE BIO SURGEON STRL SZ 6.5 (GLOVE) ×2 IMPLANT
GLOVE BIO SURGEONS STRL SZ 6.5 (GLOVE) ×1
GLOVE SURG ORTHO 8.0 STRL STRW (GLOVE) ×3 IMPLANT
GOWN STRL REUS W/ TWL LRG LVL3 (GOWN DISPOSABLE) ×1 IMPLANT
GOWN STRL REUS W/TWL 2XL LVL3 (GOWN DISPOSABLE) ×3 IMPLANT
GOWN STRL REUS W/TWL LRG LVL3 (GOWN DISPOSABLE) ×3
HEMOSTAT SURGICEL 2X14 (HEMOSTASIS) IMPLANT
KIT BASIN OR (CUSTOM PROCEDURE TRAY) ×3 IMPLANT
KIT TURNOVER KIT B (KITS) ×3 IMPLANT
MANIFOLD NEPTUNE WASTE (CANNULA) ×3 IMPLANT
NDL BLUNT 16X1.5 OR ONLY (NEEDLE) ×1 IMPLANT
NDL DENTAL 27 LONG (NEEDLE) ×2 IMPLANT
NEEDLE BLUNT 16X1.5 OR ONLY (NEEDLE) ×3 IMPLANT
NEEDLE DENTAL 27 LONG (NEEDLE) ×6 IMPLANT
NS IRRIG 1000ML POUR BTL (IV SOLUTION) ×3 IMPLANT
PACK EENT II TURBAN DRAPE (CUSTOM PROCEDURE TRAY) ×3 IMPLANT
PAD ARMBOARD 7.5X6 YLW CONV (MISCELLANEOUS) ×3 IMPLANT
SPONGE SURGIFOAM ABS GEL 100 (HEMOSTASIS) IMPLANT
SPONGE SURGIFOAM ABS GEL 12-7 (HEMOSTASIS) IMPLANT
SPONGE SURGIFOAM ABS GEL SZ50 (HEMOSTASIS) IMPLANT
SUCTION FRAZIER HANDLE 10FR (MISCELLANEOUS) ×2
SUCTION TUBE FRAZIER 10FR DISP (MISCELLANEOUS) ×1 IMPLANT
SUT CHROMIC 3 0 PS 2 (SUTURE) ×6 IMPLANT
SUT CHROMIC 4 0 P 3 18 (SUTURE) IMPLANT
SYR 50ML SLIP (SYRINGE) ×3 IMPLANT
TOWEL NATURAL 10PK STERILE (DISPOSABLE) ×3 IMPLANT
TUBE CONNECTING 12'X1/4 (SUCTIONS) ×1
TUBE CONNECTING 12X1/4 (SUCTIONS) ×2 IMPLANT
WATER STERILE IRR 1000ML POUR (IV SOLUTION) ×3 IMPLANT
WATER TABLETS ICX (MISCELLANEOUS) ×3 IMPLANT
YANKAUER SUCT BULB TIP NO VENT (SUCTIONS) ×3 IMPLANT

## 2018-05-16 NOTE — Transfer of Care (Signed)
Immediate Anesthesia Transfer of Care Note  Patient: Jeffery Bennett  Procedure(s) Performed: Extraction of tooth #'s 16, 30,31, and 32 with alveoloplasty and gross debridement of remaining teeth (N/A )  Patient Location: PACU  Anesthesia Type:General  Level of Consciousness: drowsy and patient cooperative  Airway & Oxygen Therapy: Patient Spontanous Breathing and Patient connected to nasal cannula oxygen  Post-op Assessment: Report given to RN, Post -op Vital signs reviewed and stable and Patient moving all extremities X 4  Post vital signs: Reviewed and stable  Last Vitals:  Vitals Value Taken Time  BP 130/66 05/16/2018 10:21 AM  Temp    Pulse 96 05/16/2018 10:22 AM  Resp 9 05/16/2018 10:22 AM  SpO2 92 % 05/16/2018 10:22 AM  Vitals shown include unvalidated device data.  Last Pain:  Vitals:   05/16/18 0512  TempSrc: Oral  PainSc:       Patients Stated Pain Goal: 0 (05/06/18 2212)  Complications: No apparent anesthesia complications

## 2018-05-16 NOTE — Anesthesia Procedure Notes (Signed)
Procedure Name: Intubation Date/Time: 05/16/2018 8:57 AM Performed by: Julieta Bellini, CRNA Pre-anesthesia Checklist: Patient identified, Emergency Drugs available, Suction available and Patient being monitored Patient Re-evaluated:Patient Re-evaluated prior to induction Oxygen Delivery Method: Circle system utilized Preoxygenation: Pre-oxygenation with 100% oxygen Induction Type: IV induction Ventilation: Mask ventilation without difficulty and Nasal airway inserted- appropriate to patient size Laryngoscope Size: Mac and 4 Grade View: Grade I Nasal Tubes: Left, Nasal prep performed, Nasal Rae and Magill forceps- large, utilized Tube size: 7.0 mm Number of attempts: 1 Placement Confirmation: ETT inserted through vocal cords under direct vision,  positive ETCO2 and breath sounds checked- equal and bilateral Tube secured with: Tape Dental Injury: Teeth and Oropharynx as per pre-operative assessment  Comments: Attempt to dilate right nare using nasal airway size 7.63m, not able. Attempted to dilate left nare with 7.023mnasal airway, successful and then 7.13m52masal airway. A 7.13mm21msal rae was attempted with no success in passing. Decision to use 7.0mm 66mal rae with successful passing through the left nare, grade 1 view using Mac 4 and large McGills.

## 2018-05-16 NOTE — Anesthesia Postprocedure Evaluation (Signed)
Anesthesia Post Note  Patient: Jeffery Bennett  Procedure(s) Performed: Extraction of tooth #'s 16, 30,31, and 32 with alveoloplasty and gross debridement of remaining teeth (N/A )     Patient location during evaluation: PACU Anesthesia Type: General Level of consciousness: awake Pain management: pain level controlled Vital Signs Assessment: post-procedure vital signs reviewed and stable Respiratory status: spontaneous breathing Cardiovascular status: stable Postop Assessment: no apparent nausea or vomiting Anesthetic complications: no    Last Vitals:  Vitals:   05/16/18 1025 05/16/18 1044  BP: 135/69 (!) 121/59  Pulse: 93 80  Resp: 12 14  Temp:  36.6 C  SpO2: 94% 93%    Last Pain:  Vitals:   05/16/18 1200  TempSrc:   PainSc: Asleep   Pain Goal: Patients Stated Pain Goal: 0 (05/06/18 2212)               Toriann Spadoni JR,JOHN Susann GivensFRANKLIN

## 2018-05-16 NOTE — Progress Notes (Signed)
PRE-OPERATIVE NOTE:  05/16/2018 Devoria AlbeBrian K Fincher [id number]  VITALS: BP (!) 101/46 (BP Location: Right Arm)   Pulse 67   Temp 98.2 F (36.8 C) (Oral)   Resp 20   Ht 5\' 9"  (1.753 m)   Wt 183 lb 8.1 oz (83.2 kg)   SpO2 99%   BMI 27.10 kg/m   Lab Results  Component Value Date   WBC 8.0 05/15/2018   HGB 10.1 (L) 05/15/2018   HCT 32.2 (L) 05/15/2018   MCV 83.4 05/15/2018   PLT 300 05/15/2018   BMET    Component Value Date/Time   NA 138 05/16/2018 0609   K 4.1 05/16/2018 0609   CL 105 05/16/2018 0609   CO2 28 05/16/2018 0609   GLUCOSE 99 05/16/2018 0609   BUN 15 05/16/2018 0609   CREATININE 1.22 05/16/2018 0609   CALCIUM 8.5 (L) 05/16/2018 0609   GFRNONAA >60 05/16/2018 0609   GFRAA >60 05/16/2018 0609    Lab Results  Component Value Date   INR 0.96 05/09/2018   INR 0.97 01/14/2016   INR 0.87 10/05/2009   No results found for: PTT   Devoria AlbeBrian K Blyden presents for multiple dental extractions with alveoloplasty and gross debridement remaining dentition in the operating room with general anesthesia.  The patient understands that after more thorough examination in the operating room, additional teeth may be extracted than was initially planned.    SUBJECTIVE: The patient denies any acute medical or dental changes and agrees to proceed with treatment as planned.  EXAM: No sign of acute dental changes.  ASSESSMENT: Patient is affected by acute pulpitis, dental caries, retained root segments, chronic periodontitis, and Accretions.  PLAN: Patient agrees to proceed with treatment as planned in the operating room as previously discussed and accepts the risks, benefits, and complications of the proposed treatment. Patient is aware of the risk for bleeding, bruising, swelling, infection, pain, nerve damage, soft tissue damage, damage to adjacent teeth, sinus involvement, root tip fracture, mandible fracture, and the risks of complications associated with the anesthesia.  Patient also is aware of the potential for other complications not mentioned above.   Charlynne Panderonald F. Kulinski, DDS

## 2018-05-16 NOTE — Progress Notes (Signed)
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 Repeat blood cultures from 5/21 are negative. Last gentamicin trough was 0.6 mcg/ml. Renal function stable. Patient s/p dental extraction today   Plan: Gentamicin 100mg  IV q12h for synergy dosing Ampicillin 2gm IV q4h Plan for at least weekly GT to ensure staying <1 for synergy- watching renal function/UOP closely   Height: 5\' 9"  (175.3 cm) Weight: 183 lb 8.1 oz (83.2 kg) IBW/kg (Calculated) : 70.7  Temp (24hrs), Avg:98 F (36.7 C), Min:97.6 F (36.4 C), Max:98.2 F (36.8 C)  Recent Labs  Lab 05/11/18 0410 05/12/18 0219 05/13/18 0454 05/14/18 0411 05/14/18 1741 05/15/18 0413 05/16/18 0609  WBC  --   --   --   --   --  8.0  --   CREATININE 1.21 1.30* 1.18 1.15  --  1.19 1.22  GENTTROUGH  --   --   --   --  0.6  --   --   GENTRANDOM 1.2  --   --   --   --   --   --     Estimated Creatinine Clearance: 83.7 mL/min (by C-G formula based on SCr of 1.22 mg/dL).    No Known Allergies  Shakiyah Cirilo A. Jeanella CrazePierce, PharmD, BCPS Clinical Pharmacist [place] Pager: [phone]   05/16/2018 11:15 AM

## 2018-05-16 NOTE — Op Note (Signed)
OPERATIVE REPORT  Patient:            Jeffery Bennett Date of Birth:  10/01/1981 MRN:                [id number]   DATE OF PROCEDURE:  05/16/2018  PREOPERATIVE DIAGNOSES: 1.  Aortic valve endocarditis 2.  Pre-heart valve surgery dental protocol 3.  History of acute pulpitis 4.  Retained root segment 5.  Dental caries 6.  Chronic periodontitis 7.  Accretions 8.  Tooth mobility  POSTOPERATIVE DIAGNOSES: 1.  Aortic valve endocarditis 2.  Pre-heart valve surgery dental protocol 3.  History of acute pulpitis 4.  Retained root segment 5.  Dental caries 6.  Chronic periodontitis 7.  Accretions 8.  Tooth mobility   OPERATIONS: 1. Multiple extraction of tooth numbers 16, 30, 31, and 32 2.  2 Quadrants of alveoloplasty 3. Gross debridement of remaining dentition   SURGEON: Charlynne Pander, DDS  ASSISTANT: Pearletha Alfred (dental assistant)  ANESTHESIA: General anesthesia via nasoendotracheal tube.  MEDICATIONS: 1.  Ampicillin and gentamicin IV per previous orders  2. Local anesthesia with a total utilization of 3 carpules each containing 34 mg of lidocaine with 0.017 mg of epinephrine as well as 1 carpules each containing 9 mg of bupivacaine with 0.009 mg of epinephrine.  SPECIMENS: There are 4 teeth that were discarded.  DRAINS: None  CULTURES: None  COMPLICATIONS: None  ESTIMATED BLOOD LOSS: 150 mLs.  INTRAVENOUS FLUIDS: 600 mLs of Lactated ringers solution.  INDICATIONS: The patient was recently diagnosed with aortic valve endocarditis.  A medically necessary dental consultation was then requested to evaluate poor dentition.  The patient was examined and treatment planned for multiple extractions with alveoloplasty and gross debridement remaining dentition in the operating room and general anesthesia.  This treatment plan was formulated to decrease the risks and complications associated with dental infection from further affecting the patient's systemic  health and the anticipated heart valve surgery.  OPERATIVE FINDINGS: Patient was examined operating room number 18.  The teeth were identified for extraction.  Tooth numbers 16, 30, 31, and 32 were identified for extraction.  Tooth #31 was added to the treatment plan for extraction secondary to significant dental caries.  The patient was noted be affected by history of acute pulpitis, dental caries, retained root segments, chronic periodontitis, accretions, and tooth mobility.  DESCRIPTION OF PROCEDURE: Patient was brought to the main operating room number 18. Patient was then placed in the supine position on the operating table. General anesthesia was then induced per the anesthesia team. The patient was then prepped and draped in the usual manner for the dental medicine procedure. A timeout was performed. The patient was identified and procedures were verified. A throat pack was placed at this time. The oral cavity was then thoroughly examined with the findings noted above. The patient was then ready for dental medicine procedure as follows:  Local anesthesia was then administered sequentially with a total utilization of 3 carpules each containing 34 mg of lidocaine with 0.017 mg of epinephrine as well as 1 carpule  each containing 9 mg bupivacaine with 0.009 mg of epinephrine.  The Maxillary left quadrant was first approached. Anesthesia was then delivered utilizing infiltration with lidocaine with epinephrine. A #15 blade incision was then made from the maxillary left tuberosity and extended to the mesial of #14.  A  surgical flap was then carefully reflected.  A surgical handpiece of birth copious amounts of sterile water were used to remove  buccal and interseptal bone around tooth #16.  A 53L forceps was then used to remove tooth #16 without complications.  Alveoloplasty was then performed utilizing a ronguers and bone file to help achieve primary closure.. The surgical site was then irrigated with  copious amounts of sterile saline.  A piece of MRDH was then placed in the extraction sockets appropriately.  The tissues were approximated and trimmed appropriately. The surgical site was then closed from the maxillary left tuberosity and extended the mesial #14 utilizing 3-0 chromic gut suture in a continuous interrupted suture technique x1.  At this point time, the mandibular right quadrant was approached. The patient was given an bilateral inferior alveolar nerve blocks and long buccal nerve block utilizing the bupivacaine with epinephrine. Further infiltration was then achieved utilizing the lidocaine with epinephrine. A 15 blade incision was then made from the distal of number 32 and extended to the mesial of #29.  A surgical flap was then carefully reflected.  Tooth numbers 30 through 32 were then subluxated with a series of straight elevators.  A surgical handpiece and bur and copious amounts sterile water were used to remove buccal and interseptal bone around tooth numbers 30, 31, and 32 at this time.  Teeth were then re-subluxated with a series straight elevators.  Tooth #30 and 31 was then removed with a 23 forceps without complications.  Tooth number 32 was then removed with a 151 forceps without complications.  Alveoloplasty was then performed utilizing a rongeurs and bone file to help achieve primary closure. The tissues were approximated and trimmed appropriately. The surgical sites were then irrigated with copious amounts of sterile saline.  A piece of MRDH was then placed the extraction sockets appropriately.  The mandibular right surgical site was then closed from the distal of #32 and extended to the distal of #28 utilizing 3-0 chromic gut suture in a continuous interrupted suture technique x1.    At this point in time, the remaining teeth were approached.  A sonic scaler was used to remove accretions.  A series of hand curettes were then used to further remove accretions.  The Sonic scaler  was then again used to further refine the removal of accretions.  This completed the gross debridement procedure.  At this point time, the entire mouth was irrigated with copious amounts of sterile saline. The patient was examined for complications, seeing none, the dental medicine procedure was deemed to be complete. The throat pack was removed at this time. An oral airway was then placed at the request of the anesthesia team. A series of 4 x 4 gauze moistened with Amicar 5% rinse were placed in the mouth to aid hemostasis. The patient was then handed over to the anesthesia team for final disposition. After an appropriate amount of time, the patient was extubated and taken to the postanesthsia care unit in good condition. All counts were correct for the dental medicine procedure.  The patient is to continue Amicar 5% rinses postoperatively.  Patient is to rinse with 10 mils every hour for the next 10 hours in a swish and spit manner.  Lovenox therapy may be restarted at 2200 this evening.  Dr. Laneta SimmersBartle may proceed with heart valve surgery at his discretion barring any complications from dental extraction procedures.   Charlynne Panderonald F. Kulinski, DDS.

## 2018-05-16 NOTE — Progress Notes (Signed)
Scant drainage on gauze ite pads

## 2018-05-16 NOTE — Progress Notes (Signed)
PROGRESS NOTE    Jeffery Bennett  UEA:[id number] DOB: Sep 27, 1981 DOA: 04/29/2018 PCP: Patient, No Pcp Per    Brief Narrative:  37-year-old male who presented with left shoulder pain.  He does have significant past medical history of IV drug abuse.  He noticed lesions on his right foot which were tender and erythematous.  Located in the plantar aspect.  Worsening left shoulder pain over the last 3 days.  On the initial physical examination blood pressure 124/67, heart rate 101, respiratory 16, oxygen saturation 94%.  Lungs are clear to auscultation bilaterally, heart S1-S2 present rhythmic, abdomen soft nontender, tender skin lesions on his right foot.  Patient was admitted to the hospital with the working diagnosis of systemic inflammatory response syndrome suspected bacteremia/endocarditis.  Assessment & Plan:   Active Problems:   Polysubstance abuse (HCC)   Hepatitis C virus infection without hepatic coma   Chest pain   Normochromic normocytic anemia   Pneumonitis   Endocarditis  1.  Enterococcal native aortic valve endocarditis, complicated by septic emboli. Patient underwent multiple dental extractions today, continue IV antibiotic therapy, pan for aortic valve surgery on 6/7. Patient has been afebrile, wbc at 8.0. Cultures from 05/21 with no growth. Continue antibiotic therapy with ampicillin and gentamycin.   2. History of IV drug abuse. Will continue neuro checks per unit protocol, this am with no withdrawal symptoms.  3. SVT. Rate has been controlled, will continue telemetry monitoring, echocardiogram with normal LV systolic function.   4. Chronic hep C. No clinical signs of liver failure.     DVT prophylaxis:    Enoxaparin   Code Status:  full Family Communication: no family at the bedside  Disposition Plan:    Consultants:     Procedures:     Antimicrobials:   Ampicillin  Gentamycin     Subjective: Feeling somnolent post procedure, pain well  controlled, no nausea or vomiting, no chest pain, no dyspnea.   Objective: Vitals:   05/16/18 0512 05/16/18 1021 05/16/18 1025 05/16/18 1044  BP: (!) 101/46 130/66 135/69 (!) 121/59  Pulse: 67  93 80  Resp: 20 (!) 9 12 14   Temp: 98.2 F (36.8 C) 97.6 F (36.4 C)  97.9 F (36.6 C)  TempSrc: Oral   Oral  SpO2: 99% 94% 94% 93%  Weight:      Height:        Intake/Output Summary (Last 24 hours) at 05/16/2018 1131 Last data filed at 05/16/2018 1005 Gross per 24 hour  Intake 2610 ml  Output 150 ml  Net 2460 ml   Filed Weights   05/10/18 2240 05/11/18 2018 05/12/18 2033  Weight: 83.1 kg (183 lb 4.8 oz) 83.2 kg (183 lb 8 oz) 83.2 kg (183 lb 8.1 oz)    Examination:   General: deconditioned  Neurology: Awake and alert, non focal  E ENT: mild pallor, no icterus, oral mucosa moist Cardiovascular: No JVD. S1-S2 present, rhythmic, no gallops, rubs. Positive diastolic murmurs at the left sternal border 2/3-6 No lower extremity edema. Pulmonary: vesicular breath sounds bilaterally, adequate air movement, no wheezing, rhonchi or rales. Gastrointestinal. Abdomen with no organomegaly, non tender, no rebound or guarding Skin. No rashes Musculoskeletal: no joint deformities     Data Reviewed: I have personally reviewed following labs and imaging studies  CBC: Recent Labs  Lab 05/15/18 0413  WBC 8.0  HGB 10.1*  HCT 32.2*  MCV 83.4  PLT 300   Basic Metabolic Panel: Recent Labs  Lab 05/12/18 0219 05/13/18  [id number] 05/14/18 0411 05/15/18 0413 05/16/18 0609  NA 138 138 140 139 138  K 3.8 3.6 4.1 4.2 4.1  CL 101 101 105 101 105  CO2 28 28 31 30 28   GLUCOSE 110* 106* 88 96 99  BUN 21* 19 14 16 15   CREATININE 1.30* 1.18 1.15 1.19 1.22  CALCIUM 8.6* 8.8* 8.7* 9.0 8.5*   GFR: Estimated Creatinine Clearance: 83.7 mL/min (by C-G formula based on SCr of 1.22 mg/dL). Liver Function Tests: No results for input(s): AST, ALT, ALKPHOS, BILITOT, PROT, ALBUMIN in the last 168 hours. No  results for input(s): LIPASE, AMYLASE in the last 168 hours. No results for input(s): AMMONIA in the last 168 hours. Coagulation Profile: No results for input(s): INR, PROTIME in the last 168 hours. Cardiac Enzymes: No results for input(s): CKTOTAL, CKMB, CKMBINDEX, TROPONINI in the last 168 hours. BNP (last 3 results) No results for input(s): PROBNP in the last 8760 hours. HbA1C: No results for input(s): HGBA1C in the last 72 hours. CBG: No results for input(s): GLUCAP in the last 168 hours. Lipid Profile: No results for input(s): CHOL, HDL, LDLCALC, TRIG, CHOLHDL, LDLDIRECT in the last 72 hours. Thyroid Function Tests: No results for input(s): TSH, T4TOTAL, FREET4, T3FREE, THYROIDAB in the last 72 hours. Anemia Panel: No results for input(s): VITAMINB12, FOLATE, FERRITIN, TIBC, IRON, RETICCTPCT in the last 72 hours.    Radiology Studies: I have reviewed all of the imaging during this hospital visit personally     Scheduled Meds: . aminocaproic acid  10 mL Oral Q1H  . Chlorhexidine Gluconate Cloth  6 each Topical Q0600  . methadone  100 mg Oral Daily  . nicotine  21 mg Transdermal Daily   Continuous Infusions: . ampicillin (OMNIPEN) IV 2 g (05/16/18 1049)  . gentamicin Stopped (05/16/18 0554)  . lactated ringers       LOS: 16 days        Coralie KeensMauricio Daniel Dhrithi Riche, MD [facility] Pager [phone]

## 2018-05-17 ENCOUNTER — Encounter (HOSPITAL_COMMUNITY): Payer: Self-pay | Admitting: Dentistry

## 2018-05-17 DIAGNOSIS — F1910 Other psychoactive substance abuse, uncomplicated: Secondary | ICD-10-CM

## 2018-05-17 DIAGNOSIS — I351 Nonrheumatic aortic (valve) insufficiency: Secondary | ICD-10-CM

## 2018-05-17 LAB — BASIC METABOLIC PANEL
ANION GAP: 9 (ref 5–15)
BUN: 15 mg/dL (ref 6–20)
CHLORIDE: 102 mmol/L (ref 101–111)
CO2: 26 mmol/L (ref 22–32)
Calcium: 8.7 mg/dL — ABNORMAL LOW (ref 8.9–10.3)
Creatinine, Ser: 1.07 mg/dL (ref 0.61–1.24)
GFR calc non Af Amer: >60 mL/min (ref >60)
Glucose, Bld: 140 mg/dL — ABNORMAL HIGH (ref 65–99)
POTASSIUM: 4.6 mmol/L (ref 3.5–5.1)
SODIUM: 137 mmol/L (ref 135–145)

## 2018-05-17 LAB — CBC WITH DIFFERENTIAL/PLATELET
ABS IMMATURE GRANULOCYTES: 0.1 10*3/uL (ref 0.0–0.1)
Basophils Absolute: 0 10*3/uL (ref 0.0–0.1)
Basophils Relative: 0 %
Eosinophils Absolute: 0 10*3/uL (ref 0.0–0.7)
Eosinophils Relative: 0 %
HEMATOCRIT: 33.3 % — AB (ref 39.0–52.0)
HEMOGLOBIN: 10.6 g/dL — AB (ref 13.0–17.0)
IMMATURE GRANULOCYTES: 1 %
LYMPHS ABS: 1.9 10*3/uL (ref 0.7–4.0)
LYMPHS PCT: 12 %
MCH: 26.1 pg (ref 26.0–34.0)
MCHC: 31.8 g/dL (ref 30.0–36.0)
MCV: 82 fL (ref 78.0–100.0)
Monocytes Absolute: 1.1 10*3/uL — ABNORMAL HIGH (ref 0.1–1.0)
Monocytes Relative: 7 %
NEUTROS ABS: 13.2 10*3/uL — AB (ref 1.7–7.7)
Neutrophils Relative %: 80 %
Platelets: 333 10*3/uL (ref 150–400)
RBC: 4.06 MIL/uL — AB (ref 4.22–5.81)
RDW: 14 % (ref 11.5–15.5)
WBC: 16.4 10*3/uL — AB (ref 4.0–10.5)

## 2018-05-17 LAB — ABO/RH: ABO/RH(D): A NEG

## 2018-05-17 LAB — PREPARE RBC (CROSSMATCH)

## 2018-05-17 MED ORDER — TEMAZEPAM 15 MG PO CAPS
15.0000 mg | ORAL_CAPSULE | Freq: Once | ORAL | Status: AC | PRN
  Administered 2018-05-18: 15 mg via ORAL
  Filled 2018-05-17: qty 1

## 2018-05-17 MED ORDER — SODIUM CHLORIDE 0.9 % IR SOLN
30.0000 mL | Status: DC
Start: 2018-05-17 — End: 2018-05-18
  Administered 2018-05-17: 30 mL
  Administered 2018-05-17: 23:00:00
  Administered 2018-05-18: 30 mL

## 2018-05-17 MED ORDER — SODIUM CHLORIDE 0.9 % IV SOLN
INTRAVENOUS | Status: AC
  Administered 2018-05-18: 1 [IU]/h via INTRAVENOUS
  Filled 2018-05-17: qty 1

## 2018-05-17 MED ORDER — DIAZEPAM 5 MG PO TABS
5.0000 mg | ORAL_TABLET | Freq: Once | ORAL | Status: AC
  Administered 2018-05-18: 5 mg via ORAL
  Filled 2018-05-17: qty 1

## 2018-05-17 MED ORDER — CHLORHEXIDINE GLUCONATE CLOTH 2 % EX PADS
6.0000 | MEDICATED_PAD | Freq: Once | CUTANEOUS | Status: AC
  Administered 2018-05-17: 6 via TOPICAL

## 2018-05-17 MED ORDER — MAGNESIUM SULFATE 50 % IJ SOLN
40.0000 meq | INTRAMUSCULAR | Status: DC
  Filled 2018-05-17: qty 9.85

## 2018-05-17 MED ORDER — TRANEXAMIC ACID 1000 MG/10ML IV SOLN
1.5000 mg/kg/h | INTRAVENOUS | Status: AC
  Administered 2018-05-18: 1.5 mg/kg/h via INTRAVENOUS
  Filled 2018-05-17: qty 25

## 2018-05-17 MED ORDER — MILRINONE LACTATE IN DEXTROSE 20-5 MG/100ML-% IV SOLN
0.1250 ug/kg/min | INTRAVENOUS | Status: DC
  Filled 2018-05-17 (×2): qty 100

## 2018-05-17 MED ORDER — DEXMEDETOMIDINE HCL IN NACL 400 MCG/100ML IV SOLN
0.1000 ug/kg/h | INTRAVENOUS | Status: AC
  Administered 2018-05-18: .2 ug/kg/h via INTRAVENOUS
  Filled 2018-05-17: qty 100

## 2018-05-17 MED ORDER — VANCOMYCIN HCL 10 G IV SOLR
1250.0000 mg | INTRAVENOUS | Status: AC
  Administered 2018-05-18: 1250 mg via INTRAVENOUS
  Filled 2018-05-17: qty 1250

## 2018-05-17 MED ORDER — NITROGLYCERIN IN D5W 200-5 MCG/ML-% IV SOLN
2.0000 ug/min | INTRAVENOUS | Status: AC
  Administered 2018-05-18: 10 ug/min via INTRAVENOUS
  Filled 2018-05-17: qty 250

## 2018-05-17 MED ORDER — PHENYLEPHRINE HCL 10 MG/ML IJ SOLN
30.0000 ug/min | INTRAMUSCULAR | Status: DC
  Filled 2018-05-17: qty 2

## 2018-05-17 MED ORDER — DOPAMINE-DEXTROSE 3.2-5 MG/ML-% IV SOLN
0.0000 ug/kg/min | INTRAVENOUS | Status: DC
  Filled 2018-05-17: qty 250

## 2018-05-17 MED ORDER — SODIUM CHLORIDE 0.9 % IV SOLN
750.0000 mg | INTRAVENOUS | Status: DC
  Filled 2018-05-17: qty 750

## 2018-05-17 MED ORDER — METOPROLOL TARTRATE 12.5 MG HALF TABLET
12.5000 mg | ORAL_TABLET | Freq: Once | ORAL | Status: AC
  Administered 2018-05-18: 12.5 mg via ORAL
  Filled 2018-05-17: qty 1

## 2018-05-17 MED ORDER — SODIUM CHLORIDE 0.9 % IV SOLN
INTRAVENOUS | Status: DC
  Filled 2018-05-17: qty 30

## 2018-05-17 MED ORDER — TRANEXAMIC ACID (OHS) PUMP PRIME SOLUTION
2.0000 mg/kg | INTRAVENOUS | Status: DC
  Filled 2018-05-17: qty 1.66

## 2018-05-17 MED ORDER — TRANEXAMIC ACID (OHS) BOLUS VIA INFUSION
15.0000 mg/kg | INTRAVENOUS | Status: AC
  Administered 2018-05-18: 1248 mg via INTRAVENOUS
  Filled 2018-05-17: qty 1248

## 2018-05-17 MED ORDER — CHLORHEXIDINE GLUCONATE 0.12 % MT SOLN
15.0000 mL | Freq: Once | OROMUCOSAL | Status: AC
  Administered 2018-05-18: 15 mL via OROMUCOSAL
  Filled 2018-05-17: qty 15

## 2018-05-17 MED ORDER — EPINEPHRINE PF 1 MG/ML IJ SOLN
0.0000 ug/min | INTRAVENOUS | Status: DC
  Filled 2018-05-17: qty 4

## 2018-05-17 MED ORDER — CHLORHEXIDINE GLUCONATE CLOTH 2 % EX PADS: 6.0000 | MEDICATED_PAD | Freq: Once | CUTANEOUS | Status: DC

## 2018-05-17 MED ORDER — BISACODYL 5 MG PO TBEC
5.0000 mg | DELAYED_RELEASE_TABLET | Freq: Once | ORAL | Status: DC
  Filled 2018-05-17: qty 1

## 2018-05-17 MED ORDER — CHLORHEXIDINE GLUCONATE 0.12 % MT SOLN
15.0000 mL | Freq: Two times a day (BID) | OROMUCOSAL | Status: DC
  Administered 2018-05-17 (×3): 15 mL via OROMUCOSAL
  Filled 2018-05-17 (×2): qty 15

## 2018-05-17 MED ORDER — POTASSIUM CHLORIDE 2 MEQ/ML IV SOLN
80.0000 meq | INTRAVENOUS | Status: DC
  Filled 2018-05-17: qty 40

## 2018-05-17 MED ORDER — SODIUM CHLORIDE 0.9 % IV SOLN
1.5000 g | INTRAVENOUS | Status: AC
  Administered 2018-05-18: 1.5 g via INTRAVENOUS
  Filled 2018-05-17: qty 1.5

## 2018-05-17 MED ORDER — PLASMA-LYTE 148 IV SOLN
INTRAVENOUS | Status: AC
  Administered 2018-05-18: 500 mL
  Filled 2018-05-17: qty 2.5

## 2018-05-17 NOTE — Progress Notes (Signed)
PROGRESS NOTE    Jeffery AlbeBrian K Crittendon  EAV:[id number] DOB: 31-Oct-1981 DOA: 04/29/2018 PCP: Patient, No Pcp Per    Brief Narrative:  37 year old male who presented with left shoulder pain.  He does have significant past medical history of IV drug abuse.  He noticed lesions on his right foot which were tender and erythematous.  Located in the plantar aspect.  Worsening left shoulder pain over the last 3 days.  On the initial physical examination blood pressure 124/67, heart rate 101, respiratory 16, oxygen saturation 94%.  Lungs are clear to auscultation bilaterally, heart S1-S2 present rhythmic, abdomen soft nontender, tender skin lesions on his right foot.  Patient was admitted to the hospital with the working diagnosis of systemic inflammatory response syndrome suspected bacteremia/endocarditis.  Assessment & Plan:   Active Problems:   Polysubstance abuse (HCC)   Hepatitis C virus infection without hepatic coma   Chest pain   Normochromic normocytic anemia   Pneumonitis   Endocarditis   1.  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 Sp multiple dental extractions (06/06).  IV antibiotic therapy with ampicillin and gentamycin. Scheduled for aortic valve replacement on 6/7. Patient has remained afebrile, with no growth on blood cultures.   2. History of IV drug abuse. Neuro checks per unit protocol, this am with no withdrawal symptoms.   3. SVT. Telemetry monitoring, patient has remained on sinus rhythm.   4. Chronic hep C. No clinical signs of liver failure. Will need close follow up as outpatient.     DVT prophylaxis:    Enoxaparin   Code Status:  full Family Communication: no family at the bedside  Disposition Plan:    Consultants:     Procedures:     Antimicrobials:   Ampicillin  Gentamycin    Subjective: Patient feeling well, mild dental pain, no nausea or vomiting, no chest pain  or dyspnea.   Objective: Vitals:   05/16/18 1700 05/16/18 2123 05/17/18 0537 05/17/18 0853  BP: (!) 127/56 (!) 121/57 115/65 (!) 108/55  Pulse: 79 (!) 113 74 72  Resp: 16 14 14 18   Temp: 98.4 F (36.9 C) (!) 97.5 F (36.4 C) 98.3 F (36.8 C) 98.1 F (36.7 C)  TempSrc: Oral Oral Oral Oral  SpO2: 99% 99% 98% 98%  Weight:      Height:        Intake/Output Summary (Last 24 hours) at 05/17/2018 1007 Last data filed at 05/17/2018 0936 Gross per 24 hour  Intake 1010 ml  Output 425 ml  Net 585 ml   Filed Weights   05/10/18 2240 05/11/18 2018 05/12/18 2033  Weight: 83.1 kg (183 lb 4.8 oz) 83.2 kg (183 lb 8 oz) 83.2 kg (183 lb 8.1 oz)    Examination:   General: Not in pain or dyspnea, deconditioned  Neurology: Awake and alert, non focal  E ENT: mild pallor, no icterus, oral mucosa moist Cardiovascular: No JVD. S1-S2 present, rhythmic, no gallops, rubs. Diastolic murmur at the base, no radiation. No lower extremity edema. Pulmonary: positive breath sounds bilaterally, adequate air movement, no wheezing, rhonchi or rales. Gastrointestinal. Abdomen with no organomegaly, non tender, no rebound or guarding Skin. No rashes Musculoskeletal: no joint deformities     Data Reviewed: I have personally reviewed following labs and imaging studies  CBC: Recent Labs  Lab 05/15/18 0413 05/17/18 0402  WBC 8.0 16.4*  NEUTROABS  --  13.2*  HGB 10.1* 10.6*  HCT 32.2* 33.3*  MCV 83.4  82.0  PLT 300 333   Basic Metabolic Panel: Recent Labs  Lab 05/13/18 0454 05/14/18 0411 05/15/18 0413 05/16/18 0609 05/17/18 0402  NA 138 140 139 138 137  K 3.6 4.1 4.2 4.1 4.6  CL 101 105 101 105 102  CO2 28 31 30 28 26   GLUCOSE 106* 88 96 99 140*  BUN 19 14 16 15 15   CREATININE 1.18 1.15 1.19 1.22 1.07  CALCIUM 8.8* 8.7* 9.0 8.5* 8.7*   GFR: Estimated Creatinine Clearance: 95.4 mL/min (by C-G formula based on SCr of 1.07 mg/dL). Liver Function Tests: No results for input(s): AST, ALT,  ALKPHOS, BILITOT, PROT, ALBUMIN in the last 168 hours. No results for input(s): LIPASE, AMYLASE in the last 168 hours. No results for input(s): AMMONIA in the last 168 hours. Coagulation Profile: No results for input(s): INR, PROTIME in the last 168 hours. Cardiac Enzymes: No results for input(s): CKTOTAL, CKMB, CKMBINDEX, TROPONINI in the last 168 hours. BNP (last 3 results) No results for input(s): PROBNP in the last 8760 hours. HbA1C: No results for input(s): HGBA1C in the last 72 hours. CBG: No results for input(s): GLUCAP in the last 168 hours. Lipid Profile: No results for input(s): CHOL, HDL, LDLCALC, TRIG, CHOLHDL, LDLDIRECT in the last 72 hours. Thyroid Function Tests: No results for input(s): TSH, T4TOTAL, FREET4, T3FREE, THYROIDAB in the last 72 hours. Anemia Panel: No results for input(s): VITAMINB12, FOLATE, FERRITIN, TIBC, IRON, RETICCTPCT in the last 72 hours.    Radiology Studies: I have reviewed all of the imaging during this hospital visit personally     Scheduled Meds: . Chlorhexidine Gluconate Cloth  6 each Topical Q0600  . [START ON 05/18/2018] heparin-papaverine-plasmalyte irrigation   Irrigation To OR  . [START ON 05/18/2018] magnesium sulfate  40 mEq Other To OR  . methadone  100 mg Oral Daily  . nicotine  21 mg Transdermal Daily  . [START ON 05/18/2018] potassium chloride  80 mEq Other To OR  . [START ON 05/18/2018] tranexamic acid  15 mg/kg Intravenous To OR  . [START ON 05/18/2018] tranexamic acid  2 mg/kg Intracatheter To OR   Continuous Infusions: . ampicillin (OMNIPEN) IV 2 g (05/17/18 0755)  . [START ON 05/18/2018] cefUROXime (ZINACEF)  IV    . [START ON 05/18/2018] cefUROXime (ZINACEF)  IV    . [START ON 05/18/2018] dexmedetomidine    . [START ON 05/18/2018] DOPamine    . [START ON 05/18/2018] epinephrine    . gentamicin Stopped (05/17/18 0610)  . [START ON 05/18/2018] heparin 30,000 units/NS 1000 mL solution for CELLSAVER    . [START ON 05/18/2018] insulin  (NOVOLIN-R) infusion    . lactated ringers    . [START ON 05/18/2018] milrinone    . [START ON 05/18/2018] nitroGLYCERIN    . [START ON 05/18/2018] phenylephrine 20mg /253mL NS (0.08mg /ml) infusion    . [START ON 05/18/2018] tranexamic acid (CYKLOKAPRON) infusion ([facility])    . [START ON 05/18/2018] vancomycin       LOS: 17 days        Mauricio Annett Gula, MD [facility] Pager [phone]

## 2018-05-17 NOTE — Progress Notes (Signed)
1 Day Post-Op Procedure(s) (LRB): Extraction of tooth #'s 16, 30,31, and 32 with alveoloplasty and gross debridement of remaining teeth (N/A) Subjective:  Mild soreness on right side of mouth from extractions, but otherwise feels well. No shortness of breath.  Objective: Vital signs in last 24 hours: Temp:  [97.5 F (36.4 C)-98.4 F (36.9 C)] 98 F (36.7 C) (06/06 1529) Pulse Rate:  [72-113] 81 (06/06 1529) Resp:  [14-18] 18 (06/06 1529) BP: (108-148)/(55-75) 148/75 (06/06 1529) SpO2:  [98 %-100 %] 100 % (06/06 1529)  Hemodynamic parameters for last 24 hours:    Intake/Output from previous day: 06/05 0701 - 06/06 0700 In: 1360 [P.O.:360; I.V.:600; IV Piggyback:400] Out: 575 [Urine:425; Blood:150] Intake/Output this shift: Total I/O In: 1050 [P.O.:540; I.V.:10; IV Piggyback:500] Out: -   General appearance: alert and cooperative Neurologic: intact Heart: regular rate and rhythm and 2/6 systolic and 3/6 diastolic AI murmur Lungs: clear to auscultation bilaterally Extremities: extremities normal, atraumatic, no cyanosis or edema  Lab Results: Recent Labs    05/15/18 0413 05/17/18 0402  WBC 8.0 16.4*  HGB 10.1* 10.6*  HCT 32.2* 33.3*  PLT 300 333   BMET:  Recent Labs    05/16/18 0609 05/17/18 0402  NA 138 137  K 4.1 4.6  CL 105 102  CO2 28 26  GLUCOSE 99 140*  BUN 15 15  CREATININE 1.22 1.07  CALCIUM 8.5* 8.7*    PT/INR: No results for input(s): LABPROT, INR in the last 72 hours. ABG No results found for: PHART, HCO3, TCO2, ACIDBASEDEF, O2SAT CBG (last 3)  No results for input(s): GLUCAP in the last 72 hours.  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  He has been on ampicillin and gentamicin IV since 04/30/2018 and remains afebrile with a normal white blood cell count.  He did well with his extractions  yesterday and Dr. Kristin Bruins feels that we can proceed with aortic valve replacement.  I discussed the alternatives of mechanical and bioprosthetic aortic valve replacement.  He is only 37 years old so I think a bioprosthetic valve would have a very limited life span in his case, possibly less than 10 years.  A mechanical valve would be a much better long-term treatment for him although he would need to be on daily Coumadin for the rest of his life.  With his intravenous drug use history there is some concern about his ability to comply with daily Coumadin and INR follow-up.  I discussed that with him and he feels like he could manage daily Coumadin and the long-term follow-up and would rather have a mechanical valve that would not need to be replaced.  I stressed the importance of taking the Coumadin as instructed every day and maintaining INR follow-up otherwise result may be thromboemboli with stroke or clotting of the mechanical valve.  This may result in severe disability or death and possible need for repeat surgery.  He understands that.  I also discussed the importance of no drug use and maintaining good dental hygiene and dental follow-up.  He understands that if he returns to intravenous drug use a prosthetic valve will likely get infected and that most likely he would not be a candidate for redo aortic valve replacement.  He does have a history of chronic hepatitis C but has normal liver function and coagulation profile. I discussed the operative procedure with the  patient and family including alternatives, benefits and risks; including but not limited to bleeding, blood transfusion, infection, stroke, myocardial infarction, heart block requiring a permanent pacemaker, organ dysfunction, and death.  Jeffery Bennett understands and agrees to proceed.  We will schedule surgery for tomorrow.   LOS: 17 days    Jeffery Bennett 05/17/2018

## 2018-05-17 NOTE — Anesthesia Preprocedure Evaluation (Addendum)
Anesthesia Evaluation  Patient identified by MRN, date of birth, ID band Patient awake    Reviewed: Allergy & Precautions, NPO status , Patient's Chart, lab work & pertinent test results  Airway Mallampati: II  TM Distance: >3 FB Neck ROM: Full    Dental   Pulmonary Current Smoker,    breath sounds clear to auscultation       Cardiovascular + Valvular Problems/Murmurs AI  Rhythm:Regular Rate:Normal  Study Conclusions  - Left ventricle: Systolic function was normal. The estimated ejection fraction was in the range of 60% to 65%. - Aortic valve: Severe AR. SBE on valve LIkely large vegetation vs torn leaflet segments involving the non and left coronary cusps No evidence of annular abscess. There was severe regurgitation. - Mitral valve: No evidence of vegetation. - Left atrium: No evidence of thrombus in the atrial cavity or appendage. - Right atrium: No evidence of thrombus in the atrial cavity or appendage. - Atrial septum: No defect or patent foramen ovale was identified.   Neuro/Psych negative neurological ROS     GI/Hepatic negative GI ROS, (+)     substance abuse  IV drug use, Hepatitis -, C  Endo/Other  negative endocrine ROS  Renal/GU negative Renal ROS     Musculoskeletal   Abdominal   Peds  Hematology  (+) anemia ,   Anesthesia Other Findings   Reproductive/Obstetrics                           Lab Results  Component Value Date   WBC 16.4 (H) 05/17/2018   HGB 10.6 (L) 05/17/2018   HCT 33.3 (L) 05/17/2018   MCV 82.0 05/17/2018   PLT 333 05/17/2018   Lab Results  Component Value Date   CREATININE 1.07 05/17/2018   BUN 15 05/17/2018   NA 137 05/17/2018   K 4.6 05/17/2018   CL 102 05/17/2018   CO2 26 05/17/2018    Anesthesia Physical Anesthesia Plan  ASA: IV  Anesthesia Plan: General   Post-op Pain Management:    Induction: Intravenous  PONV Risk Score and Plan: 2  and Ondansetron, Dexamethasone and Treatment may vary due to age or medical condition  Airway Management Planned: Oral ETT  Additional Equipment: Arterial line, CVP, PA Cath, TEE and Ultrasound Guidance Line Placement  Intra-op Plan:   Post-operative Plan: Post-operative intubation/ventilation  Informed Consent: I have reviewed the patients History and Physical, chart, labs and discussed the procedure including the risks, benefits and alternatives for the proposed anesthesia with the patient or authorized representative who has indicated his/her understanding and acceptance.   Dental advisory given  Plan Discussed with: CRNA  Anesthesia Plan Comments:        Anesthesia Quick Evaluation

## 2018-05-17 NOTE — Progress Notes (Signed)
POST OPERATIVE NOTE:  05/17/2018 Devoria AlbeBrian K Machia [id number]  VITALS: BP (!) 108/55 (BP Location: Left Arm)   Pulse 72   Temp 98.1 F (36.7 C) (Oral)   Resp 18   Ht 5\' 9"  (1.753 m)   Wt 183 lb 8.1 oz (83.2 kg)   SpO2 98%   BMI 27.10 kg/m   LABS:  Lab Results  Component Value Date   WBC 16.4 (H) 05/17/2018   HGB 10.6 (L) 05/17/2018   HCT 33.3 (L) 05/17/2018   MCV 82.0 05/17/2018   PLT 333 05/17/2018   BMET    Component Value Date/Time   NA 137 05/17/2018 0402   K 4.6 05/17/2018 0402   CL 102 05/17/2018 0402   CO2 26 05/17/2018 0402   GLUCOSE 140 (H) 05/17/2018 0402   BUN 15 05/17/2018 0402   CREATININE 1.07 05/17/2018 0402   CALCIUM 8.7 (L) 05/17/2018 0402   GFRNONAA >60 05/17/2018 0402   GFRAA >60 05/17/2018 0402    Lab Results  Component Value Date   INR 0.96 05/09/2018   INR 0.97 01/14/2016   INR 0.87 10/05/2009   No results found for: PTT   Devoria AlbeBrian K Gorr is status post multiple extractions with alveoloplasty and gross debridement remaining dentition in the operating room on 05/16/2018.  SUBJECTIVE: Patient indicates extraction sites are little bit sore on the lower right quadrant.  Patient denies any active bleeding.  EXAM: There is no sign of infection, heme, or ooze.  Sutures are intact.  Clots are present.  Patient is healing in by generalized primary closure.  ASSESSMENT: Post operative course is consistent with dental procedures performed in the operating room with general anesthesia. Loss of teeth due to extraction  PLAN: 1.  Use chlorhexidine rinses twice daily after breakfast and at bedtime. 2.  Use salt water rinses every 2 hours while awake in between the chlorhexidine rinses 3.  Advance diet as tolerated with nutritional consultation. 4.  Patient is currently cleared for heart valve surgery. 5.  Patient to contact dental medicine for follow-up appointment for evaluation of healing once he is discharged from his anticipated heart valve  surgery.   Charlynne Panderonald F. Kulinski, DDS

## 2018-05-18 ENCOUNTER — Encounter (HOSPITAL_COMMUNITY)
Admission: EM | Discharge status: Against Medical Advice | Payer: Self-pay | Source: Home / Self Care | Attending: Surgery

## 2018-05-18 ENCOUNTER — Encounter (HOSPITAL_COMMUNITY): Payer: Self-pay | Admitting: Anesthesiology

## 2018-05-18 ENCOUNTER — Inpatient Hospital Stay (HOSPITAL_COMMUNITY): Payer: Self-pay | Admitting: Anesthesiology

## 2018-05-18 ENCOUNTER — Inpatient Hospital Stay (HOSPITAL_COMMUNITY): Payer: Self-pay

## 2018-05-18 DIAGNOSIS — Z952 Presence of prosthetic heart valve: Secondary | ICD-10-CM

## 2018-05-18 HISTORY — PX: AORTIC VALVE REPLACEMENT: SHX41

## 2018-05-18 HISTORY — PX: TEE WITHOUT CARDIOVERSION: SHX5443

## 2018-05-18 LAB — POCT I-STAT, CHEM 8
BUN: 17 mg/dL (ref 6–20)
BUN: 14 mg/dL (ref 6–20)
BUN: 16 mg/dL (ref 6–20)
BUN: 15 mg/dL (ref 6–20)
BUN: 16 mg/dL (ref 6–20)
BUN: 16 mg/dL (ref 6–20)
BUN: 13 mg/dL (ref 6–20)
CALCIUM ION: 0.93 mmol/L — AB (ref 1.15–1.40)
CALCIUM ION: 1.16 mmol/L (ref 1.15–1.40)
CALCIUM ION: 1.06 mmol/L — AB (ref 1.15–1.40)
CALCIUM ION: 1.09 mmol/L — AB (ref 1.15–1.40)
CALCIUM ION: 1.16 mmol/L (ref 1.15–1.40)
CHLORIDE: 105 mmol/L (ref 101–111)
CHLORIDE: 100 mmol/L — AB (ref 101–111)
CHLORIDE: 104 mmol/L (ref 101–111)
CHLORIDE: 104 mmol/L (ref 101–111)
CREATININE: 1.1 mg/dL (ref 0.61–1.24)
CREATININE: 0.8 mg/dL (ref 0.61–1.24)
Calcium, Ion: 1.19 mmol/L (ref 1.15–1.40)
Calcium, Ion: 1.08 mmol/L — ABNORMAL LOW (ref 1.15–1.40)
Chloride: 104 mmol/L (ref 101–111)
Chloride: 103 mmol/L (ref 101–111)
Chloride: 103 mmol/L (ref 101–111)
Creatinine, Ser: 0.8 mg/dL (ref 0.61–1.24)
Creatinine, Ser: 1.1 mg/dL (ref 0.61–1.24)
Creatinine, Ser: 1 mg/dL (ref 0.61–1.24)
Creatinine, Ser: 1.1 mg/dL (ref 0.61–1.24)
Creatinine, Ser: 1 mg/dL (ref 0.61–1.24)
GLUCOSE: 93 mg/dL (ref 65–99)
GLUCOSE: 109 mg/dL — AB (ref 65–99)
GLUCOSE: 95 mg/dL (ref 65–99)
GLUCOSE: 115 mg/dL — AB (ref 65–99)
Glucose, Bld: 163 mg/dL — ABNORMAL HIGH (ref 65–99)
Glucose, Bld: 147 mg/dL — ABNORMAL HIGH (ref 65–99)
Glucose, Bld: 106 mg/dL — ABNORMAL HIGH (ref 65–99)
HCT: 28 % — ABNORMAL LOW (ref 39.0–52.0)
HCT: 26 % — ABNORMAL LOW (ref 39.0–52.0)
HCT: 27 % — ABNORMAL LOW (ref 39.0–52.0)
HCT: 26 % — ABNORMAL LOW (ref 39.0–52.0)
HCT: 25 % — ABNORMAL LOW (ref 39.0–52.0)
HCT: 27 % — ABNORMAL LOW (ref 39.0–52.0)
HCT: 28 % — ABNORMAL LOW (ref 39.0–52.0)
HEMOGLOBIN: 8.5 g/dL — AB (ref 13.0–17.0)
HEMOGLOBIN: 9.5 g/dL — AB (ref 13.0–17.0)
Hemoglobin: 9.5 g/dL — ABNORMAL LOW (ref 13.0–17.0)
Hemoglobin: 8.8 g/dL — ABNORMAL LOW (ref 13.0–17.0)
Hemoglobin: 9.2 g/dL — ABNORMAL LOW (ref 13.0–17.0)
Hemoglobin: 8.8 g/dL — ABNORMAL LOW (ref 13.0–17.0)
Hemoglobin: 9.2 g/dL — ABNORMAL LOW (ref 13.0–17.0)
POTASSIUM: 3.8 mmol/L (ref 3.5–5.1)
POTASSIUM: 4.3 mmol/L (ref 3.5–5.1)
POTASSIUM: 4.8 mmol/L (ref 3.5–5.1)
Potassium: 3.9 mmol/L (ref 3.5–5.1)
Potassium: 4 mmol/L (ref 3.5–5.1)
Potassium: 4.2 mmol/L (ref 3.5–5.1)
Potassium: 4.6 mmol/L (ref 3.5–5.1)
SODIUM: 139 mmol/L (ref 135–145)
SODIUM: 137 mmol/L (ref 135–145)
SODIUM: 139 mmol/L (ref 135–145)
Sodium: 139 mmol/L (ref 135–145)
Sodium: 139 mmol/L (ref 135–145)
Sodium: 141 mmol/L (ref 135–145)
Sodium: 137 mmol/L (ref 135–145)
TCO2: 28 mmol/L (ref 22–32)
TCO2: 26 mmol/L (ref 22–32)
TCO2: 28 mmol/L (ref 22–32)
TCO2: 24 mmol/L (ref 22–32)
TCO2: 24 mmol/L (ref 22–32)
TCO2: 24 mmol/L (ref 22–32)
TCO2: 23 mmol/L (ref 22–32)

## 2018-05-18 LAB — POCT I-STAT 3, ART BLOOD GAS (G3+)
ACID-BASE DEFICIT: 2 mmol/L (ref 0.0–2.0)
ACID-BASE DEFICIT: 2 mmol/L (ref 0.0–2.0)
Acid-Base Excess: 1 mmol/L (ref 0.0–2.0)
Acid-base deficit: 1 mmol/L (ref 0.0–2.0)
Acid-base deficit: 3 mmol/L — ABNORMAL HIGH (ref 0.0–2.0)
BICARBONATE: 24.1 mmol/L (ref 20.0–28.0)
Bicarbonate: 26.8 mmol/L (ref 20.0–28.0)
Bicarbonate: 24.9 mmol/L (ref 20.0–28.0)
Bicarbonate: 22.6 mmol/L (ref 20.0–28.0)
Bicarbonate: 22.8 mmol/L (ref 20.0–28.0)
O2 SAT: 100 %
O2 SAT: 99 %
O2 Saturation: 100 %
O2 Saturation: 98 %
O2 Saturation: 99 %
PCO2 ART: 45.8 mmHg (ref 32.0–48.0)
PCO2 ART: 46.8 mmHg (ref 32.0–48.0)
PH ART: 7.334 — AB (ref 7.350–7.450)
PH ART: 7.362 (ref 7.350–7.450)
PO2 ART: 162 mmHg — AB (ref 83.0–108.0)
TCO2: 28 mmol/L (ref 22–32)
TCO2: 26 mmol/L (ref 22–32)
TCO2: 26 mmol/L (ref 22–32)
TCO2: 24 mmol/L (ref 22–32)
TCO2: 24 mmol/L (ref 22–32)
pCO2 arterial: 46.4 mmHg (ref 32.0–48.0)
pCO2 arterial: 38.1 mmHg (ref 32.0–48.0)
pCO2 arterial: 40 mmHg (ref 32.0–48.0)
pH, Arterial: 7.375 (ref 7.350–7.450)
pH, Arterial: 7.316 — ABNORMAL LOW (ref 7.350–7.450)
pH, Arterial: 7.378 (ref 7.350–7.450)
pO2, Arterial: 399 mmHg — ABNORMAL HIGH (ref 83.0–108.0)
pO2, Arterial: 229 mmHg — ABNORMAL HIGH (ref 83.0–108.0)
pO2, Arterial: 103 mmHg (ref 83.0–108.0)
pO2, Arterial: 140 mmHg — ABNORMAL HIGH (ref 83.0–108.0)

## 2018-05-18 LAB — POCT I-STAT 3, VENOUS BLOOD GAS (G3P V)
ACID-BASE DEFICIT: 3 mmol/L — AB (ref 0.0–2.0)
Bicarbonate: 23.3 mmol/L (ref 20.0–28.0)
O2 SAT: 66 %
PCO2 VEN: 44.6 mmHg (ref 44.0–60.0)
TCO2: 25 mmol/L (ref 22–32)
pH, Ven: 7.327 (ref 7.250–7.430)
pO2, Ven: 37 mmHg (ref 32.0–45.0)

## 2018-05-18 LAB — CBC
HCT: 29.8 % — ABNORMAL LOW (ref 39.0–52.0)
HEMATOCRIT: 29.3 % — AB (ref 39.0–52.0)
HEMOGLOBIN: 9.1 g/dL — AB (ref 13.0–17.0)
HEMOGLOBIN: 9.4 g/dL — AB (ref 13.0–17.0)
MCH: 26.5 pg (ref 26.0–34.0)
MCH: 26.2 pg (ref 26.0–34.0)
MCHC: 31.1 g/dL (ref 30.0–36.0)
MCHC: 31.5 g/dL (ref 30.0–36.0)
MCV: 85.2 fL (ref 78.0–100.0)
MCV: 83 fL (ref 78.0–100.0)
PLATELETS: 209 10*3/uL (ref 150–400)
Platelets: 199 10*3/uL (ref 150–400)
RBC: 3.44 MIL/uL — AB (ref 4.22–5.81)
RBC: 3.59 MIL/uL — AB (ref 4.22–5.81)
RDW: 14.6 % (ref 11.5–15.5)
RDW: 14.5 % (ref 11.5–15.5)
WBC: 24.2 10*3/uL — ABNORMAL HIGH (ref 4.0–10.5)
WBC: 14.2 10*3/uL — ABNORMAL HIGH (ref 4.0–10.5)

## 2018-05-18 LAB — CBC WITH DIFFERENTIAL/PLATELET
ABS IMMATURE GRANULOCYTES: 0.1 10*3/uL (ref 0.0–0.1)
BASOS ABS: 0.1 10*3/uL (ref 0.0–0.1)
BASOS PCT: 1 %
Eosinophils Absolute: 0.2 10*3/uL (ref 0.0–0.7)
Eosinophils Relative: 2 %
HCT: 32.3 % — ABNORMAL LOW (ref 39.0–52.0)
Hemoglobin: 10.1 g/dL — ABNORMAL LOW (ref 13.0–17.0)
Immature Granulocytes: 1 %
Lymphocytes Relative: 30 %
Lymphs Abs: 3.3 10*3/uL (ref 0.7–4.0)
MCH: 26.2 pg (ref 26.0–34.0)
MCHC: 31.3 g/dL (ref 30.0–36.0)
MCV: 83.9 fL (ref 78.0–100.0)
MONO ABS: 0.9 10*3/uL (ref 0.1–1.0)
Monocytes Relative: 8 %
NEUTROS ABS: 6.5 10*3/uL (ref 1.7–7.7)
NEUTROS PCT: 58 %
PLATELETS: 293 10*3/uL (ref 150–400)
RBC: 3.85 MIL/uL — AB (ref 4.22–5.81)
RDW: 14.5 % (ref 11.5–15.5)
WBC: 11 10*3/uL — AB (ref 4.0–10.5)

## 2018-05-18 LAB — BASIC METABOLIC PANEL
Anion gap: 8 (ref 5–15)
BUN: 17 mg/dL (ref 6–20)
CO2: 27 mmol/L (ref 22–32)
CREATININE: 1.27 mg/dL — AB (ref 0.61–1.24)
Calcium: 8.5 mg/dL — ABNORMAL LOW (ref 8.9–10.3)
Chloride: 104 mmol/L (ref 101–111)
GFR calc Af Amer: >60 mL/min (ref >60)
Glucose, Bld: 99 mg/dL (ref 65–99)
POTASSIUM: 4.1 mmol/L (ref 3.5–5.1)
SODIUM: 139 mmol/L (ref 135–145)

## 2018-05-18 LAB — GLUCOSE, CAPILLARY
GLUCOSE-CAPILLARY: 123 mg/dL — AB (ref 65–99)
GLUCOSE-CAPILLARY: 124 mg/dL — AB (ref 65–99)
GLUCOSE-CAPILLARY: 125 mg/dL — AB (ref 65–99)
GLUCOSE-CAPILLARY: 115 mg/dL — AB (ref 65–99)
GLUCOSE-CAPILLARY: 118 mg/dL — AB (ref 65–99)
GLUCOSE-CAPILLARY: 112 mg/dL — AB (ref 65–99)
Glucose-Capillary: 115 mg/dL — ABNORMAL HIGH (ref 65–99)

## 2018-05-18 LAB — POCT I-STAT 4, (NA,K, GLUC, HGB,HCT)
GLUCOSE: 89 mg/dL (ref 65–99)
HCT: 24 % — ABNORMAL LOW (ref 39.0–52.0)
HEMOGLOBIN: 8.2 g/dL — AB (ref 13.0–17.0)
POTASSIUM: 4.4 mmol/L (ref 3.5–5.1)
SODIUM: 140 mmol/L (ref 135–145)

## 2018-05-18 LAB — PLATELET COUNT: Platelets: 253 10*3/uL (ref 150–400)

## 2018-05-18 LAB — APTT: aPTT: 30 seconds (ref 24–36)

## 2018-05-18 LAB — PREPARE RBC (CROSSMATCH)

## 2018-05-18 LAB — PROTIME-INR
INR: 1.33
Prothrombin Time: 16.4 seconds — ABNORMAL HIGH (ref 11.4–15.2)

## 2018-05-18 LAB — CREATININE, SERUM
CREATININE: 0.94 mg/dL (ref 0.61–1.24)
GFR calc Af Amer: >60 mL/min (ref >60)
GFR calc non Af Amer: >60 mL/min (ref >60)

## 2018-05-18 LAB — HEMOGLOBIN AND HEMATOCRIT, BLOOD
HCT: 25.3 % — ABNORMAL LOW (ref 39.0–52.0)
Hemoglobin: 7.9 g/dL — ABNORMAL LOW (ref 13.0–17.0)

## 2018-05-18 LAB — MAGNESIUM: Magnesium: 3 mg/dL — ABNORMAL HIGH (ref 1.7–2.4)

## 2018-05-18 SURGERY — REPLACEMENT, AORTIC VALVE, OPEN
Anesthesia: General | Site: Chest

## 2018-05-18 MED ORDER — MIDAZOLAM HCL 5 MG/5ML IJ SOLN
INTRAMUSCULAR | Status: DC | PRN
  Administered 2018-05-18 (×3): 2 mg via INTRAVENOUS
  Administered 2018-05-18: 4 mg via INTRAVENOUS

## 2018-05-18 MED ORDER — ACETAMINOPHEN 160 MG/5ML PO SOLN: 650.0000 mg | Freq: Once | ORAL | Status: AC

## 2018-05-18 MED ORDER — INSULIN REGULAR BOLUS VIA INFUSION
0.0000 [IU] | Freq: Three times a day (TID) | INTRAVENOUS | Status: DC
  Filled 2018-05-18: qty 10

## 2018-05-18 MED ORDER — FAMOTIDINE IN NACL 20-0.9 MG/50ML-% IV SOLN
20.0000 mg | Freq: Two times a day (BID) | INTRAVENOUS | Status: AC
  Administered 2018-05-18 (×2): 20 mg via INTRAVENOUS
  Filled 2018-05-18: qty 50

## 2018-05-18 MED ORDER — BISACODYL 10 MG RE SUPP: 10.0000 mg | Freq: Every day | RECTAL | Status: DC

## 2018-05-18 MED ORDER — LACTATED RINGERS IV SOLN: INTRAVENOUS | Status: DC

## 2018-05-18 MED ORDER — SODIUM CHLORIDE 0.9 % IV SOLN
0.0000 ug/min | INTRAVENOUS | Status: DC
  Filled 2018-05-18: qty 2

## 2018-05-18 MED ORDER — PANTOPRAZOLE SODIUM 40 MG PO TBEC
40.0000 mg | DELAYED_RELEASE_TABLET | Freq: Every day | ORAL | Status: DC
  Administered 2018-05-20 – 2018-05-21 (×2): 40 mg via ORAL
  Filled 2018-05-18 (×3): qty 1

## 2018-05-18 MED ORDER — CHLORHEXIDINE GLUCONATE 0.12% ORAL RINSE (MEDLINE KIT)
15.0000 mL | Freq: Two times a day (BID) | OROMUCOSAL | Status: DC
  Administered 2018-05-19 – 2018-05-24 (×6): 15 mL via OROMUCOSAL

## 2018-05-18 MED ORDER — SODIUM CHLORIDE 0.9% FLUSH: 10.0000 mL | INTRAVENOUS | Status: DC | PRN

## 2018-05-18 MED ORDER — ASPIRIN EC 325 MG PO TBEC
325.0000 mg | DELAYED_RELEASE_TABLET | Freq: Every day | ORAL | Status: DC
  Administered 2018-05-19 – 2018-05-21 (×2): 325 mg via ORAL
  Filled 2018-05-18 (×4): qty 1

## 2018-05-18 MED ORDER — SODIUM CHLORIDE 0.45 % IV SOLN
INTRAVENOUS | Status: DC | PRN
  Administered 2018-05-18: 13:00:00 via INTRAVENOUS

## 2018-05-18 MED ORDER — SODIUM CHLORIDE 0.9 % IV SOLN
INTRAVENOUS | Status: DC | PRN
  Administered 2018-05-18: 750 mg via INTRAVENOUS

## 2018-05-18 MED ORDER — PROTAMINE SULFATE 10 MG/ML IV SOLN
INTRAVENOUS | Status: DC | PRN
  Administered 2018-05-18: 10 mg via INTRAVENOUS

## 2018-05-18 MED ORDER — HEMOSTATIC AGENTS (NO CHARGE) OPTIME
TOPICAL | Status: DC | PRN
  Administered 2018-05-18: 1 via TOPICAL

## 2018-05-18 MED ORDER — METOPROLOL TARTRATE 5 MG/5ML IV SOLN: 2.5000 mg | INTRAVENOUS | Status: DC | PRN

## 2018-05-18 MED ORDER — PROPOFOL 10 MG/ML IV BOLUS
INTRAVENOUS | Status: DC | PRN
  Administered 2018-05-18: 100 mg via INTRAVENOUS
  Administered 2018-05-18: 50 mg via INTRAVENOUS

## 2018-05-18 MED ORDER — HEMOSTATIC AGENTS (NO CHARGE) OPTIME
TOPICAL | Status: DC | PRN
  Administered 2018-05-18 (×2): 1 via TOPICAL

## 2018-05-18 MED ORDER — TRAMADOL HCL 50 MG PO TABS
50.0000 mg | ORAL_TABLET | ORAL | Status: DC | PRN
  Administered 2018-05-19 – 2018-05-21 (×4): 100 mg via ORAL
  Administered 2018-05-22: 50 mg via ORAL
  Administered 2018-05-23 – 2018-05-25 (×2): 100 mg via ORAL
  Filled 2018-05-18 (×4): qty 2
  Filled 2018-05-18: qty 1
  Filled 2018-05-18 (×3): qty 2

## 2018-05-18 MED ORDER — LIDOCAINE 2% (20 MG/ML) 5 ML SYRINGE
INTRAMUSCULAR | Status: AC
  Filled 2018-05-18: qty 5

## 2018-05-18 MED ORDER — POTASSIUM CHLORIDE 10 MEQ/50ML IV SOLN: 10.0000 meq | INTRAVENOUS | Status: AC

## 2018-05-18 MED ORDER — CHLORHEXIDINE GLUCONATE 0.12 % MT SOLN
15.0000 mL | OROMUCOSAL | Status: AC
  Administered 2018-05-18: 15 mL via OROMUCOSAL

## 2018-05-18 MED ORDER — PHENYLEPHRINE HCL 10 MG/ML IJ SOLN
INTRAMUSCULAR | Status: DC | PRN
  Administered 2018-05-18: 80 ug via INTRAVENOUS

## 2018-05-18 MED ORDER — MIDAZOLAM HCL 2 MG/2ML IJ SOLN
2.0000 mg | INTRAMUSCULAR | Status: DC | PRN
  Administered 2018-05-18: 2 mg via INTRAVENOUS
  Filled 2018-05-18: qty 2

## 2018-05-18 MED ORDER — MIDAZOLAM HCL 10 MG/2ML IJ SOLN
INTRAMUSCULAR | Status: AC
  Filled 2018-05-18: qty 2

## 2018-05-18 MED ORDER — LACTATED RINGERS IV SOLN
INTRAVENOUS | Status: DC | PRN
  Administered 2018-05-18 (×2): via INTRAVENOUS

## 2018-05-18 MED ORDER — THROMBIN 20000 UNITS EX SOLR
CUTANEOUS | Status: AC
  Filled 2018-05-18: qty 20000

## 2018-05-18 MED ORDER — SODIUM CHLORIDE 0.9% FLUSH
10.0000 mL | Freq: Two times a day (BID) | INTRAVENOUS | Status: DC
  Administered 2018-05-18 (×2): 10 mL

## 2018-05-18 MED ORDER — ACETAMINOPHEN 160 MG/5ML PO SOLN: 1000.0000 mg | Freq: Four times a day (QID) | ORAL | Status: AC

## 2018-05-18 MED ORDER — LACTATED RINGERS IV SOLN: 500.0000 mL | Freq: Once | INTRAVENOUS | Status: DC | PRN

## 2018-05-18 MED ORDER — OXYCODONE HCL 5 MG PO TABS
5.0000 mg | ORAL_TABLET | ORAL | Status: DC | PRN
  Administered 2018-05-18 – 2018-05-20 (×7): 10 mg via ORAL
  Administered 2018-05-21: 5 mg via ORAL
  Administered 2018-05-22 – 2018-05-23 (×3): 10 mg via ORAL
  Filled 2018-05-18 (×10): qty 2
  Filled 2018-05-18: qty 1
  Filled 2018-05-18 (×3): qty 2

## 2018-05-18 MED ORDER — ALBUMIN HUMAN 5 % IV SOLN
250.0000 mL | INTRAVENOUS | Status: AC | PRN
  Administered 2018-05-18: 125 mL via INTRAVENOUS

## 2018-05-18 MED ORDER — ARTIFICIAL TEARS OPHTHALMIC OINT
TOPICAL_OINTMENT | OPHTHALMIC | Status: DC | PRN
  Administered 2018-05-18: 1 via OPHTHALMIC

## 2018-05-18 MED ORDER — DEXMEDETOMIDINE HCL IN NACL 200 MCG/50ML IV SOLN
0.0000 ug/kg/h | INTRAVENOUS | Status: DC
  Administered 2018-05-18: 0.04 ug/kg/h via INTRAVENOUS
  Administered 2018-05-18: 0.7 ug/kg/h via INTRAVENOUS
  Filled 2018-05-18 (×3): qty 50

## 2018-05-18 MED ORDER — MORPHINE SULFATE (PF) 2 MG/ML IV SOLN
2.0000 mg | INTRAVENOUS | Status: DC | PRN
  Administered 2018-05-18: 2 mg via INTRAVENOUS
  Administered 2018-05-19: 4 mg via INTRAVENOUS
  Administered 2018-05-19: 2 mg via INTRAVENOUS
  Administered 2018-05-19 (×2): 4 mg via INTRAVENOUS
  Filled 2018-05-18 (×2): qty 2
  Filled 2018-05-18: qty 1
  Filled 2018-05-18 (×3): qty 2
  Filled 2018-05-18: qty 1

## 2018-05-18 MED ORDER — NITROGLYCERIN IN D5W 200-5 MCG/ML-% IV SOLN: 0.0000 ug/min | INTRAVENOUS | Status: DC

## 2018-05-18 MED ORDER — HEPARIN SODIUM (PORCINE) 1000 UNIT/ML IJ SOLN
INTRAMUSCULAR | Status: AC
  Filled 2018-05-18: qty 2

## 2018-05-18 MED ORDER — MORPHINE SULFATE (PF) 2 MG/ML IV SOLN
1.0000 mg | INTRAVENOUS | Status: AC | PRN
  Administered 2018-05-18: 2 mg via INTRAVENOUS
  Administered 2018-05-18 (×2): 4 mg via INTRAVENOUS
  Filled 2018-05-18 (×2): qty 2

## 2018-05-18 MED ORDER — DOCUSATE SODIUM 100 MG PO CAPS
200.0000 mg | ORAL_CAPSULE | Freq: Every day | ORAL | Status: DC
  Administered 2018-05-19 – 2018-05-26 (×6): 200 mg via ORAL
  Filled 2018-05-18 (×8): qty 2

## 2018-05-18 MED ORDER — CHLORHEXIDINE GLUCONATE CLOTH 2 % EX PADS
6.0000 | MEDICATED_PAD | Freq: Every day | CUTANEOUS | Status: DC
  Administered 2018-05-18 – 2018-05-25 (×6): 6 via TOPICAL

## 2018-05-18 MED ORDER — BISACODYL 5 MG PO TBEC
10.0000 mg | DELAYED_RELEASE_TABLET | Freq: Every day | ORAL | Status: DC
  Administered 2018-05-19 – 2018-05-26 (×6): 10 mg via ORAL
  Filled 2018-05-18 (×7): qty 2

## 2018-05-18 MED ORDER — ROCURONIUM BROMIDE 10 MG/ML (PF) SYRINGE
PREFILLED_SYRINGE | INTRAVENOUS | Status: AC
  Filled 2018-05-18: qty 5

## 2018-05-18 MED ORDER — INSULIN ASPART 100 UNIT/ML ~~LOC~~ SOLN: 0.0000 [IU] | SUBCUTANEOUS | Status: DC

## 2018-05-18 MED ORDER — FENTANYL CITRATE (PF) 250 MCG/5ML IJ SOLN
INTRAMUSCULAR | Status: AC
  Filled 2018-05-18: qty 10

## 2018-05-18 MED ORDER — METOPROLOL TARTRATE 25 MG/10 ML ORAL SUSPENSION: 12.5000 mg | Freq: Two times a day (BID) | ORAL | Status: DC

## 2018-05-18 MED ORDER — LIDOCAINE 2% (20 MG/ML) 5 ML SYRINGE
INTRAMUSCULAR | Status: DC | PRN
  Administered 2018-05-18: 50 mg via INTRAVENOUS

## 2018-05-18 MED ORDER — FENTANYL CITRATE (PF) 250 MCG/5ML IJ SOLN
INTRAMUSCULAR | Status: AC
  Filled 2018-05-18: qty 25

## 2018-05-18 MED ORDER — LACTATED RINGERS IV SOLN
INTRAVENOUS | Status: DC | PRN
  Administered 2018-05-18: 07:00:00 via INTRAVENOUS

## 2018-05-18 MED ORDER — ROCURONIUM BROMIDE 10 MG/ML (PF) SYRINGE
PREFILLED_SYRINGE | INTRAVENOUS | Status: DC | PRN
  Administered 2018-05-18 (×2): 30 mg via INTRAVENOUS
  Administered 2018-05-18: 70 mg via INTRAVENOUS
  Administered 2018-05-18: 50 mg via INTRAVENOUS

## 2018-05-18 MED ORDER — KETOROLAC TROMETHAMINE 15 MG/ML IJ SOLN
15.0000 mg | Freq: Four times a day (QID) | INTRAMUSCULAR | Status: DC | PRN
  Administered 2018-05-18 – 2018-05-22 (×13): 15 mg via INTRAVENOUS
  Filled 2018-05-18 (×14): qty 1

## 2018-05-18 MED ORDER — PROTAMINE SULFATE 10 MG/ML IV SOLN
INTRAVENOUS | Status: AC
  Filled 2018-05-18: qty 25

## 2018-05-18 MED ORDER — SODIUM CHLORIDE 0.9 % IV SOLN
INTRAVENOUS | Status: DC
  Administered 2018-05-18: 13:00:00 via INTRAVENOUS

## 2018-05-18 MED ORDER — SODIUM CHLORIDE 0.9 % IV SOLN: 250.0000 mL | INTRAVENOUS | Status: DC

## 2018-05-18 MED ORDER — DEXMEDETOMIDINE HCL IN NACL 200 MCG/50ML IV SOLN
INTRAVENOUS | Status: AC
  Filled 2018-05-18: qty 50

## 2018-05-18 MED ORDER — ONDANSETRON HCL 4 MG/2ML IJ SOLN
4.0000 mg | Freq: Four times a day (QID) | INTRAMUSCULAR | Status: DC | PRN
  Administered 2018-05-20 – 2018-05-22 (×5): 4 mg via INTRAVENOUS
  Filled 2018-05-18 (×5): qty 2

## 2018-05-18 MED ORDER — SODIUM CHLORIDE 0.9 % IV SOLN
INTRAVENOUS | Status: DC
  Filled 2018-05-18: qty 1

## 2018-05-18 MED ORDER — THROMBIN 20000 UNITS EX SOLR
CUTANEOUS | Status: DC | PRN
  Administered 2018-05-18: 20000 [IU] via TOPICAL

## 2018-05-18 MED ORDER — ORAL CARE MOUTH RINSE
15.0000 mL | OROMUCOSAL | Status: DC
  Administered 2018-05-18 – 2018-05-19 (×4): 15 mL via OROMUCOSAL

## 2018-05-18 MED ORDER — SODIUM CHLORIDE 0.9% FLUSH: 3.0000 mL | INTRAVENOUS | Status: DC | PRN

## 2018-05-18 MED ORDER — HEPARIN SODIUM (PORCINE) 1000 UNIT/ML IJ SOLN
INTRAMUSCULAR | Status: AC
  Filled 2018-05-18: qty 4

## 2018-05-18 MED ORDER — FENTANYL CITRATE (PF) 250 MCG/5ML IJ SOLN
INTRAMUSCULAR | Status: DC | PRN
  Administered 2018-05-18 (×2): 250 ug via INTRAVENOUS
  Administered 2018-05-18: 600 ug via INTRAVENOUS
  Administered 2018-05-18: 150 ug via INTRAVENOUS
  Administered 2018-05-18: 350 ug via INTRAVENOUS
  Administered 2018-05-18: 250 ug via INTRAVENOUS
  Administered 2018-05-18: 50 ug via INTRAVENOUS
  Administered 2018-05-18: 100 ug via INTRAVENOUS

## 2018-05-18 MED ORDER — FENTANYL CITRATE (PF) 250 MCG/5ML IJ SOLN
INTRAMUSCULAR | Status: AC
  Filled 2018-05-18: qty 5

## 2018-05-18 MED ORDER — METHADONE HCL 10 MG PO TABS
50.0000 mg | ORAL_TABLET | ORAL | Status: AC
  Administered 2018-05-18: 50 mg via ORAL
  Filled 2018-05-18: qty 5

## 2018-05-18 MED ORDER — ACETAMINOPHEN 500 MG PO TABS
1000.0000 mg | ORAL_TABLET | Freq: Four times a day (QID) | ORAL | Status: AC
  Administered 2018-05-18 – 2018-05-23 (×18): 1000 mg via ORAL
  Filled 2018-05-18 (×19): qty 2

## 2018-05-18 MED ORDER — ASPIRIN 81 MG PO CHEW
324.0000 mg | CHEWABLE_TABLET | Freq: Every day | ORAL | Status: DC
  Administered 2018-05-20 – 2018-05-22 (×2): 324 mg
  Filled 2018-05-18: qty 4

## 2018-05-18 MED ORDER — SODIUM CHLORIDE 0.9% FLUSH: 3.0000 mL | Freq: Two times a day (BID) | INTRAVENOUS | Status: DC

## 2018-05-18 MED ORDER — METOPROLOL TARTRATE 12.5 MG HALF TABLET: 12.5000 mg | ORAL_TABLET | Freq: Two times a day (BID) | ORAL | Status: DC

## 2018-05-18 MED ORDER — METOCLOPRAMIDE HCL 5 MG/ML IJ SOLN
10.0000 mg | Freq: Four times a day (QID) | INTRAMUSCULAR | Status: AC
  Administered 2018-05-18 – 2018-05-19 (×4): 10 mg via INTRAVENOUS
  Filled 2018-05-18 (×3): qty 2

## 2018-05-18 MED ORDER — ACETAMINOPHEN 650 MG RE SUPP
650.0000 mg | Freq: Once | RECTAL | Status: AC
  Administered 2018-05-18: 650 mg via RECTAL

## 2018-05-18 MED ORDER — PROPOFOL 10 MG/ML IV BOLUS
INTRAVENOUS | Status: AC
  Filled 2018-05-18: qty 20

## 2018-05-18 MED ORDER — MAGNESIUM SULFATE 4 GM/100ML IV SOLN
4.0000 g | Freq: Once | INTRAVENOUS | Status: AC
  Administered 2018-05-18: 4 g via INTRAVENOUS
  Filled 2018-05-18: qty 100

## 2018-05-18 MED ORDER — HEPARIN SODIUM (PORCINE) 1000 UNIT/ML IJ SOLN
INTRAMUSCULAR | Status: DC | PRN
  Administered 2018-05-18: 30000 [IU] via INTRAVENOUS

## 2018-05-18 MED ORDER — PROTAMINE SULFATE 10 MG/ML IV SOLN
INTRAVENOUS | Status: AC
  Filled 2018-05-18: qty 5

## 2018-05-18 SURGICAL SUPPLY — 75 items
ADAPTER CARDIO PERF ANTE/RETRO (ADAPTER) ×3 IMPLANT
ADPR PRFSN 84XANTGRD RTRGD (ADAPTER) ×2
APL SWBSTK 6 STRL LF DISP (MISCELLANEOUS) ×2
APPLICATOR COTTON TIP 6 STRL (MISCELLANEOUS) IMPLANT
APPLICATOR COTTON TIP 6IN STRL (MISCELLANEOUS) ×3
BAG DECANTER FOR FLEXI CONT (MISCELLANEOUS) ×3 IMPLANT
BLADE STERNUM SYSTEM 6 (BLADE) ×3 IMPLANT
BLADE SURG 15 STRL LF DISP TIS (BLADE) ×2 IMPLANT
BLADE SURG 15 STRL SS (BLADE) ×6
CANISTER SUCT 3000ML PPV (MISCELLANEOUS) ×3 IMPLANT
CANNULA GUNDRY RCSP 15FR (MISCELLANEOUS) ×3 IMPLANT
CATH HEART VENT LEFT (CATHETERS) ×2 IMPLANT
CATH ROBINSON RED A/P 18FR (CATHETERS) ×9 IMPLANT
CATH THORACIC 36FR (CATHETERS) ×3 IMPLANT
CATH THORACIC 36FR RT ANG (CATHETERS) ×3 IMPLANT
CONT SPEC 4OZ CLIKSEAL STRL BL (MISCELLANEOUS) ×4 IMPLANT
COVER SURGICAL LIGHT HANDLE (MISCELLANEOUS) ×3 IMPLANT
CRADLE DONUT ADULT HEAD (MISCELLANEOUS) ×3 IMPLANT
DRAPE SLUSH/WARMER DISC (DRAPES) ×3 IMPLANT
DRSG COVADERM 4X14 (GAUZE/BANDAGES/DRESSINGS) ×3 IMPLANT
ELECT CAUTERY BLADE 6.4 (BLADE) ×3 IMPLANT
ELECT REM PT RETURN 9FT ADLT (ELECTROSURGICAL) ×6
ELECTRODE REM PT RTRN 9FT ADLT (ELECTROSURGICAL) ×4 IMPLANT
FELT TEFLON 1X6 (MISCELLANEOUS) ×5 IMPLANT
FLOSEAL 5ML (HEMOSTASIS) ×2 IMPLANT
GAUZE SPONGE 4X4 12PLY STRL (GAUZE/BANDAGES/DRESSINGS) ×3 IMPLANT
GAUZE SPONGE 4X4 12PLY STRL LF (GAUZE/BANDAGES/DRESSINGS) ×1 IMPLANT
GLOVE BIO SURGEON STRL SZ 6 (GLOVE) IMPLANT
GLOVE BIO SURGEON STRL SZ 6.5 (GLOVE) IMPLANT
GLOVE BIO SURGEON STRL SZ7 (GLOVE) IMPLANT
GLOVE BIO SURGEON STRL SZ7.5 (GLOVE) IMPLANT
GLOVE EUDERMIC 7 POWDERFREE (GLOVE) ×6 IMPLANT
GOWN STRL REUS W/ TWL LRG LVL3 (GOWN DISPOSABLE) ×8 IMPLANT
GOWN STRL REUS W/ TWL XL LVL3 (GOWN DISPOSABLE) ×2 IMPLANT
GOWN STRL REUS W/TWL LRG LVL3 (GOWN DISPOSABLE) ×12
GOWN STRL REUS W/TWL XL LVL3 (GOWN DISPOSABLE) ×3
HEMOSTAT ARISTA ABSORB 3G PWDR (MISCELLANEOUS) ×1 IMPLANT
HEMOSTAT POWDER SURGIFOAM 1G (HEMOSTASIS) ×6 IMPLANT
HEMOSTAT SURGICEL 2X14 (HEMOSTASIS) ×3 IMPLANT
KIT BASIN OR (CUSTOM PROCEDURE TRAY) ×3 IMPLANT
KIT CATH CPB BARTLE (MISCELLANEOUS) ×3 IMPLANT
KIT SUCTION CATH 14FR (SUCTIONS) ×3 IMPLANT
KIT TURNOVER KIT B (KITS) ×3 IMPLANT
LINE VENT (MISCELLANEOUS) ×1 IMPLANT
NS IRRIG 1000ML POUR BTL (IV SOLUTION) ×18 IMPLANT
PACK E OPEN HEART (SUTURE) ×3 IMPLANT
PACK OPEN HEART (CUSTOM PROCEDURE TRAY) ×3 IMPLANT
PAD ARMBOARD 7.5X6 YLW CONV (MISCELLANEOUS) ×6 IMPLANT
POWDER SURGICEL 3.0 GRAM (HEMOSTASIS) ×1 IMPLANT
SET CARDIOPLEGIA MPS 5001102 (MISCELLANEOUS) ×1 IMPLANT
SUT BONE WAX W31G (SUTURE) ×3 IMPLANT
SUT ETHIBON 2 0 V 52N 30 (SUTURE) ×6 IMPLANT
SUT ETHIBON EXCEL 2-0 V-5 (SUTURE) ×10 IMPLANT
SUT ETHIBOND 2 0 SH (SUTURE) ×3
SUT ETHIBOND 2 0 SH 36X2 (SUTURE) IMPLANT
SUT ETHIBOND V-5 VALVE (SUTURE) ×10 IMPLANT
SUT PROLENE 3 0 SH DA (SUTURE) IMPLANT
SUT PROLENE 3 0 SH1 36 (SUTURE) ×3 IMPLANT
SUT PROLENE 4 0 RB 1 (SUTURE) ×9
SUT PROLENE 4-0 RB1 .5 CRCL 36 (SUTURE) ×6 IMPLANT
SUT PROLENE 5 0 C 1 36 (SUTURE) ×1 IMPLANT
SUT PROLENE 6 0 C 1 30 (SUTURE) ×1 IMPLANT
SUT STEEL 6MS V (SUTURE) ×4 IMPLANT
SUT VIC AB 1 CTX 36 (SUTURE) ×9
SUT VIC AB 1 CTX36XBRD ANBCTR (SUTURE) ×4 IMPLANT
SYSTEM SAHARA CHEST DRAIN ATS (WOUND CARE) ×3 IMPLANT
TAPE CLOTH SURG 4X10 WHT LF (GAUZE/BANDAGES/DRESSINGS) ×1 IMPLANT
TAPE PAPER 2X10 WHT MICROPORE (GAUZE/BANDAGES/DRESSINGS) ×1 IMPLANT
TOWEL GREEN STERILE (TOWEL DISPOSABLE) ×3 IMPLANT
TOWEL GREEN STERILE FF (TOWEL DISPOSABLE) ×3 IMPLANT
TRAY FOLEY SLVR 16FR TEMP STAT (SET/KITS/TRAYS/PACK) ×3 IMPLANT
UNDERPAD 30X30 (UNDERPADS AND DIAPERS) ×3 IMPLANT
VALVE ON-X AORTIC 21MM (Prosthesis & Implant Heart) ×1 IMPLANT
VENT LEFT HEART 12002 (CATHETERS) ×3
WATER STERILE IRR 1000ML POUR (IV SOLUTION) ×6 IMPLANT

## 2018-05-18 NOTE — Op Note (Signed)
CARDIOVASCULAR SURGERY OPERATIVE NOTE  05/18/2018 Jeffery Bennett [id number]  Surgeon:  Alleen BorneBryan K. Justice Milliron, MD  First Assistant: Lowella DandyErin Barrett, PA-C   Preoperative Diagnosis:  Severe aortic insufficiency due to endocarditis   Postoperative Diagnosis:  Same   Procedure:  1. Median Sternotomy 2. Extracorporeal circulation 3.   Aortic valve replacement using a 21 mm ON-X  Mechanical  valve.  Anesthesia:  General Endotracheal   Clinical History/Surgical Indication:  He is a 37 year old gentleman with severe AI due to aortic valve endocarditis.  He has been on ampicillin and gentamicin IV since 04/30/2018 and remains afebrile with a normal white blood cell count.  He did well with his extractions two days ago  and Dr. Kristin BruinsKulinski feels that we can proceed with aortic valve replacement.  I discussed the alternatives of mechanical and bioprosthetic aortic valve replacement.  He is only 37 years old so I think a bioprosthetic valve would have a very limited life span in his case, possibly less than 10 years.  A mechanical valve would be a much better long-term treatment for him although he would need to be on daily Coumadin for the rest of his life.  With his intravenous drug use history there is some concern about his ability to comply with daily Coumadin and INR follow-up.  I discussed that with him and he feels like he could manage daily Coumadin and the long-term follow-up and would rather have a mechanical valve that would not need to be replaced.  I stressed the importance of taking the Coumadin as instructed every day and maintaining INR follow-up otherwise result may be thromboemboli with stroke or clotting of the mechanical valve.  This may result in severe disability or death and possible need for repeat surgery.  He understands that.  I also discussed the importance of no drug use and maintaining good dental hygiene and dental follow-up.  He understands that if he returns to intravenous drug  use a prosthetic valve will likely get infected and that most likely he would not be a candidate for redo aortic valve replacement.  He does have a history of chronic hepatitis C but has normal liver function and coagulation profile. I discussed the operative procedure with the patient and family including alternatives, benefits and risks; including but not limited to bleeding, blood transfusion, infection, stroke, myocardial infarction, heart block requiring a permanent pacemaker, organ dysfunction, and death.    Preparation:  The patient was seen in the preoperative holding area and the correct patient, correct operation were confirmed with the patient after reviewing the medical record and catheterization. The consent was signed by me. Preoperative antibiotics were given. A pulmonary arterial line and radial arterial line were placed by the anesthesia team. The patient was taken back to the operating room and positioned supine on the operating room table. After being placed under general endotracheal anesthesia by the anesthesia team a foley catheter was placed. The neck, chest, abdomen, and both legs were prepped with betadine soap and solution and draped in the usual sterile manner. A surgical time-out was taken and the correct patient and operative procedure were confirmed with the nursing and anesthesia staff.   Pre-bypass TEE:   Complete TEE assessment was performed by Dr. Marcene Duosobert Fitzgerald. Severe AI with partially flail non-coronary valve leaflet and prolapse of right coronary leaflet. LV function normal. Mild to moderate central MR with some thickening of free edge of anterior leaflet, no definite vegetation.   Post-bypass TEE:   Normal  functioning prosthetic aortic valve with no perivalvular leak or regurgitation through the valve. Left ventricular function preserved. Trivial  mitral regurgitation.    Cardiopulmonary Bypass:  A median sternotomy was performed. The pericardium was  opened in the midline. Right ventricular function appeared normal. The ascending aorta was of normal size and had no palpable plaque. There were no contraindications to aortic cannulation or cross-clamping. The patient was fully systemically heparinized and the ACT was maintained > 400 sec. The proximal aortic arch was cannulated with a 20 F aortic cannula for arterial inflow. Venous cannulation was performed via the right atrial appendage using a two-staged venous cannula. An antegrade cardioplegia/vent cannula was inserted into the mid-ascending aorta. A left ventricular vent was placed via the right superior pulmonary vein. A retrograde cardioplegia cannnula was placed into the coronary sinus via the right atrium. Aortic occlusion was performed with a single cross-clamp. Systemic cooling to 32 degrees Centigrade and topical cooling of the heart with iced saline were used. Hyperkalemic antegrade cold blood cardioplegia was used to induce diastolic arrest and then cold blood retrograde cardioplegia was given at about 20 minute intervals throughout the period of arrest to maintain myocardial temperature at or below 10 degrees centigrade. A temperature probe was inserted into the interventricular septum and an insulating pad was placed in the pericardium. Carbon dioxide was insufflated into the pericardium at 5L/min throughout the procedure to minimize intracardiac air.   Aortic Valve Replacement:   A transverse aortotomy was performed 1 cm above the take-off of the right coronary artery. The native valve was tricuspid with destruction of the non-coronary leaflet. The right coronary leaflet had a couple small vegetations on it and the free edge was partially eaten away. There was no sign of annular involvement and the anterior mitral leaflet was not involved. There was some thickening near the free edge of the anterior leaflet but no vegetation seen. The ostia of the coronary arteries were in normal position and  were not obstructed. The native valve leaflets were excised and the non-coronary leaflet sent for gram stain and culture. Care was taken to remove all particulate debris. The left ventricle was directly inspected for debris and then irrigated with ice saline solution. The annulus was sized and a size 21 mm ON-X  Mechanical  valve was chosen. The model number was [facility] and the serial number was [id number]. While the valve was being prepared 2-0 Ethibond small-pledgeted horizontal mattress sutures were placed around the annulus with the pledgets in a sub-annular position. The sutures were placed through the sewing ring and the valve lowered into place. The sutures were tied sequentially. The valve seated nicely and the coronary ostia were not obstructed. The prosthetic valve leaflets moved normally and there was no sub-valvular obstruction. The aortotomy was closed using 4-0 Prolene suture in 2 layers with felt strips to reinforce the closure.  Completion:  The patient was rewarmed to 37 degrees Centigrade. De-airing maneuvers were performed and the head placed in trendelenburg position. The crossclamp was removed with a time of 101 minutes. There was spontaneous return of sinus rhythm. The aortotomy was checked for hemostasis. Two temporary epicardial pacing wires were placed on the right atrium and two on the right ventricle. The left ventricular vent and retrograde cardioplegia cannulas were removed. The patient was weaned from CPB without difficulty on no inotropes. CPB time was 124 minutes. Cardiac output was 5 LPM. Heparin was fully reversed with protamine and the aortic and venous cannulas removed. Hemostasis was achieved.  Mediastinal drainage tubes were placed. The sternum was closed with  #6 stainless steel wires. The fascia was closed with continuous # 1 vicryl suture. The subcutaneous tissue was closed with 2-0 vicryl continuous suture. The skin was closed with 3-0 vicryl subcuticular suture. All sponge,  needle, and instrument counts were reported correct at the end of the case. Dry sterile dressings were placed over the incisions and around the chest tubes which were connected to pleurevac suction. The patient was then transported to the surgical intensive care unit in critical but stable condition.

## 2018-05-18 NOTE — Progress Notes (Signed)
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 Repeat blood cultures from 5/21 are negative.  Last gentamicin trough was 0.6 mcg/ml. Renal function stable. Patient s/p dental extraction 6/5. S/p valve surgery this am 6/7.   Orders to continue ampicillin and gent post-op.   Plan: Gentamicin 100mg  IV q12h for synergy dosing Ampicillin 2gm IV q4h Plan for at least weekly GT to ensure staying <1 for synergy- watching renal function/UOP closely Will plan on rechecking gent level 6/10 unless renal function changes significantly   Height: 5\' 9"  (175.3 cm) Weight: 184 lb 14.4 oz (83.9 kg) IBW/kg (Calculated) : 70.7  Temp (24hrs), Avg:97.9 F (36.6 C), Min:97.7 F (36.5 C), Max:98 F (36.7 C)  Recent Labs  Lab 05/14/18 1741 05/15/18 0413  05/17/18 0402 05/18/18 0406  05/18/18 0852 05/18/18 0922 05/18/18 1008 05/18/18 1115 05/18/18 1149 05/18/18 1252  WBC  --  8.0  --  16.4* 11.0*  --   --   --   --   --   --  24.2*  CREATININE  --  1.19   < > 1.07 1.27*   < > 1.10 0.80 1.00 1.10 1.00  --   GENTTROUGH 0.6  --   --   --   --   --   --   --   --   --   --   --    < > = values in this interval not displayed.    Estimated Creatinine Clearance: 102.1 mL/min (by C-G formula based on SCr of 1 mg/dL).    No Known Allergies  Sheppard CoilFrank Wilson PharmD., BCPS Clinical Pharmacist 05/18/2018 1:27 PM

## 2018-05-18 NOTE — Brief Op Note (Signed)
04/29/2018 - 05/18/2018  7:15 AM  PATIENT:  Jeffery Bennett  37 y.o. male  PRE-OPERATIVE DIAGNOSIS:  SEVERE AI AV VEGETATION  POST-OPERATIVE DIAGNOSIS:  SEVERE AI; AV VEGETATION  PROCEDURE:  Procedure(s):  AORTIC VALVE REPLACEMENT (AVR) -21mm On-x Mechanical Valve Prosthesis  TRANSESOPHAGEAL ECHOCARDIOGRAM (TEE) (N/A)  SURGEON:  Surgeon(s) and Role:    * Bartle, Payton DoughtyBryan K, MD - Primary  PHYSICIAN ASSISTANT: Lowella DandyErin Wilfredo Canterbury PA-C  ANESTHESIA:   general  EBL:  400 mL   BLOOD ADMINISTERED: CELLSAVER  DRAINS: Mediastinal Chest Drains   LOCAL MEDICATIONS USED:  NONE  SPECIMEN:  Source of Specimen:  Aortic Valve Leaflets  DISPOSITION OF SPECIMEN:  PATHOLOGY  COUNTS:  YES  TOURNIQUET:  * No tourniquets in log *  DICTATION: .Dragon Dictation  PLAN OF CARE: Admit to inpatient   PATIENT DISPOSITION:  ICU - intubated and hemodynamically stable.   Delay start of Pharmacological VTE agent (>24hrs) due to surgical blood loss or risk of bleeding: yes

## 2018-05-18 NOTE — Anesthesia Procedure Notes (Signed)
Central Venous Catheter Insertion Performed by: Suzette Battiest, MD, anesthesiologist Start/End6/06/2018 7:10 AM, 05/18/2018 7:20 AM Patient location: Pre-op. Preanesthetic checklist: patient identified, IV checked, site marked, risks and benefits discussed, surgical consent, monitors and equipment checked, pre-op evaluation, timeout performed and anesthesia consent Position: Trendelenburg Lidocaine 1% used for infiltration and patient sedated Hand hygiene performed , maximum sterile barriers used  and Seldinger technique used Catheter size: 9 Fr Total catheter length 10. Central line was placed.MAC introducer Swan type:thermodilution PA Cath depth:50 Procedure performed using ultrasound guided technique. Ultrasound Notes:anatomy identified, needle tip was noted to be adjacent to the nerve/plexus identified, no ultrasound evidence of intravascular and/or intraneural injection and image(s) printed for medical record Attempts: 1 Following insertion, line sutured and dressing applied. Post procedure assessment: blood return through all ports, free fluid flow and no air  Patient tolerated the procedure well with no immediate complications.

## 2018-05-18 NOTE — Procedures (Signed)
Extubation Procedure Note  Patient Details:   Name: Jeffery Bennett DOB: [birth year] MRN: [id number]   Airway Documentation:    Vent end date: (not recorded) Vent end time: (not recorded)   Evaluation  O2 sats: stable throughout Complications: No apparent complications Patient did tolerate procedure well. Bilateral Breath Sounds: Clear, Other (Comment)(coarse)   Yes   Patient extubated per protocol. VC 1L, NIF -25. No noted respiratory issues at this time.   Peggye FormSteven  Alberta Lenhard 05/18/2018, 4:43 PM

## 2018-05-18 NOTE — Progress Notes (Signed)
CT surgery p.m. Rounds  Doing well after CABG Patient extubated with stable hemodynamics Dangled on side of bed

## 2018-05-18 NOTE — Anesthesia Procedure Notes (Signed)
Central Venous Catheter Insertion Performed by: Marcene DuosFitzgerald, Samia Kukla, MD, anesthesiologist Start/End6/06/2018 7:10 AM, 05/18/2018 7:20 AM Patient location: Pre-op. Preanesthetic checklist: patient identified, IV checked, site marked, risks and benefits discussed, surgical consent, monitors and equipment checked, pre-op evaluation, timeout performed and anesthesia consent Hand hygiene performed  and maximum sterile barriers used  PA cath was placed.Swan type:thermodilution PA Cath depth:50 Procedure performed without using ultrasound guided technique. Attempts: 1 Patient tolerated the procedure well with no immediate complications.

## 2018-05-18 NOTE — Progress Notes (Signed)
Initial Nutrition Assessment  DOCUMENTATION CODES:   Not applicable  INTERVENTION:  RD to monitor diet advancement   NUTRITION DIAGNOSIS:   Increased nutrient needs related to post-op healing, acute illness as evidenced by estimated needs.  GOAL:   Patient will meet greater than or equal to 90% of their needs  MONITOR:   Diet advancement, Vent status, Skin, Weight trends, Labs, I & O's  REASON FOR ASSESSMENT:   Ventilator    ASSESSMENT:   37 y.o. M admitted on 04/29/18 for endocarditis and bacteremia. 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 Transesophageal echocardiogram 5/24. PMH of hep C, normocytic anemia, pneumonitis, endocarditis.   5/24 TEE 6/5 tooth extraction Pt intubated 6/7 6/7 Pt underwent median sternotomy, extracorporeal circulation, and aortic valve replacement.    PTA pt weight stable, pt up 10 lbs since admission per chart review. Per chart review pt eating 75-100% of meals.   RN reports pt ventilated due to surgery, will wean today - possible even sooner than usual given pt alertness and medication weaning.   Pt on vent, mildly alert weaning off medications. Pt able to report eating 100% of meals before the surgery and did not have any decreased appetite or weight loss. Unable to retrieve detailed nutrition or weight hx at this time due to pt vent status and no family at beside.   Medications reviewed: colace, reglan, lopressor, insulin regular, peridex, protonix, albumin, ampicillin, precedex, pepcid, gentamicin, lactated ringers infusion, magnesium sulfate.  Labs reviewed: BG 106 (H), WBC 24.2 (H), RBC 3.44 (L), hemoglobin 9.1 (L), HCT 29.3 (L).   Net I/O since admission: + 18.7 - possibly contributing to weight gain during admission  NUTRITION - FOCUSED PHYSICAL EXAM:    Most Recent Value  Orbital Region  No depletion  Upper Arm Region  No depletion  Thoracic and Lumbar Region  No depletion  Buccal Region   No depletion  Temple Region  No depletion  Clavicle Bone Region  No depletion  Clavicle and Acromion Bone Region  No depletion  Scapular Bone Region  Unable to assess  Dorsal Hand  No depletion  Patellar Region  No depletion  Anterior Thigh Region  No depletion  Posterior Calf Region  No depletion  Edema (RD Assessment)  None  Hair  Reviewed  Eyes  Reviewed  Mouth  Unable to assess  Skin  Reviewed  Nails  Reviewed       Diet Order:   Diet Order    None      EDUCATION NEEDS:   Not appropriate for education at this time  Skin:  Skin Assessment: Skin Integrity Issues: Skin Integrity Issues:: Incisions Incisions: chest  Last BM:  05/16/18  Height:   Ht Readings from Last 1 Encounters:  05/04/18 5\' 9"  (1.753 m)    Weight:   Wt Readings from Last 1 Encounters:  05/17/18 184 lb 14.4 oz (83.9 kg)    Ideal Body Weight:  72.72 kg  BMI:  Body mass index is 27.3 kg/m.  Estimated Nutritional Needs:   Kcal:  2000-2200 kcal  Protein:  95-110 grams  Fluid:  >/= 2.1 L or per MD    Sherrine MaplesMelissa Skylin Kennerson, Dietetic Intern

## 2018-05-18 NOTE — Anesthesia Postprocedure Evaluation (Signed)
Anesthesia Post Note  Patient: Jeffery Bennett  Procedure(s) Performed: AORTIC VALVE REPLACEMENT (AVR) using On-X Size 21 Mechanical Valve (N/A Chest) TRANSESOPHAGEAL ECHOCARDIOGRAM (TEE) (N/A )     Patient location during evaluation: SICU Anesthesia Type: General Level of consciousness: sedated Pain management: pain level controlled Vital Signs Assessment: post-procedure vital signs reviewed and stable Respiratory status: patient remains intubated per anesthesia plan Cardiovascular status: stable Postop Assessment: no apparent nausea or vomiting Anesthetic complications: no    Last Vitals:  Vitals:   05/18/18 1330 05/18/18 1345  BP:    Pulse: (!) 108 (!) 108  Resp: (!) 29 (!) 30  Temp: (!) 35.7 C (!) 35.7 C  SpO2: 100% 99%    Last Pain:  Vitals:   05/18/18 1300  TempSrc: Core  PainSc:                  Kennieth RadFitzgerald, Macrina Lehnert E

## 2018-05-18 NOTE — Anesthesia Procedure Notes (Signed)
Arterial Line Insertion Start/End6/06/2018 6:54 AM, 05/18/2018 7:00 AM Performed by: Adair LaundryPaxton, Lynn A, CRNA, CRNA  Patient location: OOR procedure area. Preanesthetic checklist: patient identified, IV checked, site marked, risks and benefits discussed, surgical consent, monitors and equipment checked, pre-op evaluation, timeout performed and anesthesia consent Lidocaine 1% used for infiltration radial was placed Catheter size: 20 G Maximum sterile barriers used   Attempts: 1 Procedure performed without using ultrasound guided technique. Following insertion, dressing applied and Biopatch. Post procedure assessment: normal  Patient tolerated the procedure well with no immediate complications.

## 2018-05-18 NOTE — Progress Notes (Signed)
  Echocardiogram Echocardiogram Transesophageal has been performed.  Delcie RochENNINGTON, Lc Joynt 05/18/2018, 11:02 AM

## 2018-05-18 NOTE — Transfer of Care (Signed)
Immediate Anesthesia Transfer of Care Note  Patient: Jeffery Bennett  Procedure(s) Performed: AORTIC VALVE REPLACEMENT (AVR) using On-X Size 21 Mechanical Valve (N/A Chest) TRANSESOPHAGEAL ECHOCARDIOGRAM (TEE) (N/A )  Patient Location: SICU  Anesthesia Type:General  Level of Consciousness: Patient remains intubated per anesthesia plan  Airway & Oxygen Therapy: Patient remains intubated per anesthesia plan and Patient placed on Ventilator (see vital sign flow sheet for setting)  Post-op Assessment: Report given to RN and Post -op Vital signs reviewed and stable  Post vital signs: Reviewed and stable  Last Vitals:  Vitals Value Taken Time  BP    Temp 35.4 C 05/18/2018  1:00 PM  Pulse 86 05/18/2018  1:00 PM  Resp 0 05/18/2018  1:00 PM  SpO2 100 % 05/18/2018  1:00 PM  Vitals shown include unvalidated device data.  Last Pain:  Vitals:   05/18/18 0531  TempSrc: Oral  PainSc:       Patients Stated Pain Goal: 2 (05/18/18 0300)  Complications: No apparent anesthesia complications

## 2018-05-19 ENCOUNTER — Inpatient Hospital Stay (HOSPITAL_COMMUNITY): Payer: Self-pay

## 2018-05-19 LAB — CREATININE, SERUM
Creatinine, Ser: 0.86 mg/dL (ref 0.61–1.24)
GFR calc Af Amer: >60 mL/min (ref >60)
GFR calc non Af Amer: >60 mL/min (ref >60)

## 2018-05-19 LAB — BASIC METABOLIC PANEL
Anion gap: 6 (ref 5–15)
BUN: 12 mg/dL (ref 6–20)
CALCIUM: 7.7 mg/dL — AB (ref 8.9–10.3)
CO2: 25 mmol/L (ref 22–32)
Chloride: 103 mmol/L (ref 101–111)
Creatinine, Ser: 1.01 mg/dL (ref 0.61–1.24)
GFR calc Af Amer: >60 mL/min (ref >60)
GLUCOSE: 109 mg/dL — AB (ref 65–99)
Potassium: 4.5 mmol/L (ref 3.5–5.1)
Sodium: 134 mmol/L — ABNORMAL LOW (ref 135–145)

## 2018-05-19 LAB — CBC
HEMATOCRIT: 26.5 % — AB (ref 39.0–52.0)
HEMATOCRIT: 26.8 % — AB (ref 39.0–52.0)
Hemoglobin: 8.4 g/dL — ABNORMAL LOW (ref 13.0–17.0)
Hemoglobin: 8.3 g/dL — ABNORMAL LOW (ref 13.0–17.0)
MCH: 26.1 pg (ref 26.0–34.0)
MCH: 26.1 pg (ref 26.0–34.0)
MCHC: 31.7 g/dL (ref 30.0–36.0)
MCHC: 31 g/dL (ref 30.0–36.0)
MCV: 82.3 fL (ref 78.0–100.0)
MCV: 84.3 fL (ref 78.0–100.0)
PLATELETS: 201 10*3/uL (ref 150–400)
PLATELETS: 188 10*3/uL (ref 150–400)
RBC: 3.22 MIL/uL — ABNORMAL LOW (ref 4.22–5.81)
RBC: 3.18 MIL/uL — ABNORMAL LOW (ref 4.22–5.81)
RDW: 14.6 % (ref 11.5–15.5)
RDW: 14.7 % (ref 11.5–15.5)
WBC: 11.2 10*3/uL — ABNORMAL HIGH (ref 4.0–10.5)
WBC: 12.2 10*3/uL — ABNORMAL HIGH (ref 4.0–10.5)

## 2018-05-19 LAB — POCT I-STAT, CHEM 8
BUN: 10 mg/dL (ref 6–20)
Calcium, Ion: 1.18 mmol/L (ref 1.15–1.40)
Chloride: 98 mmol/L — ABNORMAL LOW (ref 101–111)
Creatinine, Ser: 0.8 mg/dL (ref 0.61–1.24)
Glucose, Bld: 106 mg/dL — ABNORMAL HIGH (ref 65–99)
HEMATOCRIT: 25 % — AB (ref 39.0–52.0)
HEMOGLOBIN: 8.5 g/dL — AB (ref 13.0–17.0)
POTASSIUM: 3.7 mmol/L (ref 3.5–5.1)
SODIUM: 136 mmol/L (ref 135–145)
TCO2: 25 mmol/L (ref 22–32)

## 2018-05-19 LAB — GLUCOSE, CAPILLARY
GLUCOSE-CAPILLARY: 113 mg/dL — AB (ref 65–99)
GLUCOSE-CAPILLARY: 117 mg/dL — AB (ref 65–99)
GLUCOSE-CAPILLARY: 85 mg/dL (ref 65–99)
Glucose-Capillary: 120 mg/dL — ABNORMAL HIGH (ref 65–99)
Glucose-Capillary: 108 mg/dL — ABNORMAL HIGH (ref 65–99)

## 2018-05-19 LAB — MAGNESIUM
Magnesium: 2.3 mg/dL (ref 1.7–2.4)
Magnesium: 1.8 mg/dL (ref 1.7–2.4)

## 2018-05-19 MED ORDER — FUROSEMIDE 10 MG/ML IJ SOLN
20.0000 mg | Freq: Two times a day (BID) | INTRAMUSCULAR | Status: DC
  Administered 2018-05-19 – 2018-05-20 (×3): 20 mg via INTRAVENOUS
  Filled 2018-05-19 (×3): qty 2

## 2018-05-19 MED ORDER — WARFARIN SODIUM 2.5 MG PO TABS
2.5000 mg | ORAL_TABLET | Freq: Every day | ORAL | Status: DC
  Administered 2018-05-19: 2.5 mg via ORAL
  Filled 2018-05-19: qty 1

## 2018-05-19 MED ORDER — DEXMEDETOMIDINE HCL IN NACL 400 MCG/100ML IV SOLN
0.0000 ug/kg/h | INTRAVENOUS | Status: DC
  Administered 2018-05-19: 0.4 ug/kg/h via INTRAVENOUS

## 2018-05-19 MED ORDER — INSULIN ASPART 100 UNIT/ML ~~LOC~~ SOLN: 0.0000 [IU] | SUBCUTANEOUS | Status: DC

## 2018-05-19 MED ORDER — METHADONE HCL 10 MG PO TABS
100.0000 mg | ORAL_TABLET | Freq: Every day | ORAL | Status: DC
  Administered 2018-05-19 – 2018-05-28 (×10): 100 mg via ORAL
  Filled 2018-05-19 (×11): qty 10

## 2018-05-19 MED ORDER — WARFARIN - PHYSICIAN DOSING INPATIENT
Freq: Every day | Status: DC
  Administered 2018-05-24 – 2018-05-28 (×5)

## 2018-05-19 MED ORDER — ORAL CARE MOUTH RINSE
15.0000 mL | Freq: Two times a day (BID) | OROMUCOSAL | Status: DC
  Administered 2018-05-19 – 2018-05-27 (×10): 15 mL via OROMUCOSAL

## 2018-05-19 MED ORDER — POTASSIUM CHLORIDE 10 MEQ/50ML IV SOLN
10.0000 meq | INTRAVENOUS | Status: AC
  Administered 2018-05-19 (×3): 10 meq via INTRAVENOUS

## 2018-05-19 MED ORDER — METOPROLOL TARTRATE 25 MG PO TABS
25.0000 mg | ORAL_TABLET | Freq: Two times a day (BID) | ORAL | Status: DC
  Administered 2018-05-19 – 2018-05-28 (×19): 25 mg via ORAL
  Filled 2018-05-19 (×20): qty 1

## 2018-05-19 NOTE — Progress Notes (Signed)
1 Day Post-Op Procedure(s) (LRB): AORTIC VALVE REPLACEMENT (AVR) using On-X Size 21 Mechanical Valve (N/A) TRANSESOPHAGEAL ECHOCARDIOGRAM (TEE) (N/A) Subjective: HR up- resuming methadone and increasing lopressor  Objective: Vital signs in last 24 hours: Temp:  [95.7 F (35.4 C)-99.9 F (37.7 C)] 98.2 F (36.8 C) (06/08 0900) Pulse Rate:  [86-121] 86 (06/08 0900) Resp:  [0-38] 17 (06/08 0900) BP: (95-125)/(57-97) 117/79 (06/08 0900) SpO2:  [98 %-100 %] 100 % (06/08 0900) Arterial Line BP: (83-153)/(46-92) 136/67 (06/08 0900) FiO2 (%):  [40 %-50 %] 40 % (06/07 1600) Weight:  [195 lb 5.2 oz (88.6 kg)] 195 lb 5.2 oz (88.6 kg) (06/08 0500)  Hemodynamic parameters for last 24 hours: PAP: (11-33)/(0-18) 11/4 CO:  [5 L/min-7 L/min] 7 L/min CI:  [2.5 L/min/m2-3.5 L/min/m2] 3.5 L/min/m2  Intake/Output from previous day: 06/07 0701 - 06/08 0700 In: 5361.2 [I.V.:3786.2; Blood:250; IV Piggyback:1325] Out: 3559 [Urine:2855; Blood:400; Chest Tube:304] Intake/Output this shift: Total I/O In: 156.8 [I.V.:156.8] Out: -        Exam    General- alert and comfortable    Neck- no JVD, no cervical adenopathy palpable, no carotid bruit   Lungs- clear without rales, wheezes   Cor- regular rate and rhythm, no murmur , gallop   Abdomen- soft, non-tender   Extremities - warm, non-tender, minimal edema   Neuro- oriented, appropriate, no focal weakness   Lab Results: Recent Labs    05/18/18 1849 05/18/18 1851 05/19/18 0311  WBC 14.2*  --  11.2*  HGB 9.4* 9.5* 8.4*  HCT 29.8* 28.0* 26.5*  PLT 209  --  201   BMET:  Recent Labs    05/18/18 0406  05/18/18 1851 05/19/18 0311  NA 139   < > 137 134*  K 4.1   < > 4.8 4.5  CL 104   < > 103 103  CO2 27  --   --  25  GLUCOSE 99   < > 115* 109*  BUN 17   < > 13 12  CREATININE 1.27*   < > 0.80 1.01  CALCIUM 8.5*  --   --  7.7*   < > = values in this interval not displayed.    PT/INR:  Recent Labs    05/18/18 1252  LABPROT 16.4*   INR 1.33   ABG    Component Value Date/Time   PHART 7.362 05/18/2018 1746   HCO3 22.8 05/18/2018 1746   TCO2 23 05/18/2018 1851   ACIDBASEDEF 3.0 (H) 05/18/2018 1746   O2SAT 99.0 05/18/2018 1746   CBG (last 3)  Recent Labs    05/18/18 2011 05/18/18 2346 05/19/18 0749  GLUCAP 112* 117* 113*    Assessment/Plan: S/P Procedure(s) (LRB): AORTIC VALVE REPLACEMENT (AVR) using On-X Size 21 Mechanical Valve (N/A) TRANSESOPHAGEAL ECHOCARDIOGRAM (TEE) (N/A) Mobilize Diuresis d/c tubes/lines See progression orders   LOS: 19 days    Jeffery Bennett 05/19/2018

## 2018-05-19 NOTE — Progress Notes (Signed)
   CT surgery p.m. Rounds  Patient more comfortable Sinus rhythm Ambulated in hallway P.m. labs satisfactory  Vitals:   05/19/18 1600 05/19/18 1700  BP: 128/84 122/86  Pulse: (!) 103 (!) 101  Resp: (!) 23 (!) 21  Temp:    SpO2: 93% 97%

## 2018-05-20 ENCOUNTER — Inpatient Hospital Stay (HOSPITAL_COMMUNITY): Payer: Self-pay

## 2018-05-20 LAB — BASIC METABOLIC PANEL
Anion gap: 6 (ref 5–15)
BUN: 14 mg/dL (ref 6–20)
CO2: 29 mmol/L (ref 22–32)
Calcium: 8.3 mg/dL — ABNORMAL LOW (ref 8.9–10.3)
Chloride: 102 mmol/L (ref 101–111)
Creatinine, Ser: 1.01 mg/dL (ref 0.61–1.24)
GFR calc Af Amer: >60 mL/min (ref >60)
GFR calc non Af Amer: >60 mL/min (ref >60)
Glucose, Bld: 96 mg/dL (ref 65–99)
Potassium: 3.9 mmol/L (ref 3.5–5.1)
Sodium: 137 mmol/L (ref 135–145)

## 2018-05-20 LAB — PROTIME-INR
INR: 1.12
Prothrombin Time: 14.3 seconds (ref 11.4–15.2)

## 2018-05-20 LAB — CBC
HCT: 23 % — ABNORMAL LOW (ref 39.0–52.0)
Hemoglobin: 7.2 g/dL — ABNORMAL LOW (ref 13.0–17.0)
MCH: 26 pg (ref 26.0–34.0)
MCHC: 31.3 g/dL (ref 30.0–36.0)
MCV: 83 fL (ref 78.0–100.0)
Platelets: 157 10*3/uL (ref 150–400)
RBC: 2.77 MIL/uL — ABNORMAL LOW (ref 4.22–5.81)
RDW: 14.6 % (ref 11.5–15.5)
WBC: 8.8 10*3/uL (ref 4.0–10.5)

## 2018-05-20 LAB — GLUCOSE, CAPILLARY: Glucose-Capillary: 85 mg/dL (ref 65–99)

## 2018-05-20 MED ORDER — FUROSEMIDE 10 MG/ML IJ SOLN
40.0000 mg | Freq: Every day | INTRAMUSCULAR | Status: DC
  Administered 2018-05-20 – 2018-05-24 (×5): 40 mg via INTRAVENOUS
  Filled 2018-05-20 (×5): qty 4

## 2018-05-20 MED ORDER — MAGIC MOUTHWASH W/LIDOCAINE
5.0000 mL | Freq: Four times a day (QID) | ORAL | Status: DC
  Administered 2018-05-20 – 2018-05-25 (×15): 5 mL via ORAL
  Filled 2018-05-20 (×37): qty 5

## 2018-05-20 MED ORDER — METOLAZONE 5 MG PO TABS
5.0000 mg | ORAL_TABLET | Freq: Every day | ORAL | Status: DC
  Administered 2018-05-20 – 2018-05-24 (×5): 5 mg via ORAL
  Filled 2018-05-20 (×5): qty 1

## 2018-05-20 MED ORDER — MIDAZOLAM HCL 2 MG/2ML IJ SOLN: 1.0000 mg | Freq: Four times a day (QID) | INTRAMUSCULAR | Status: DC | PRN

## 2018-05-20 MED ORDER — WARFARIN SODIUM 5 MG PO TABS
5.0000 mg | ORAL_TABLET | Freq: Every day | ORAL | Status: DC
  Administered 2018-05-20 – 2018-05-21 (×2): 5 mg via ORAL
  Filled 2018-05-20 (×2): qty 1

## 2018-05-20 MED ORDER — FUROSEMIDE 10 MG/ML IJ SOLN: 40.0000 mg | Freq: Two times a day (BID) | INTRAMUSCULAR | Status: DC

## 2018-05-20 MED ORDER — MOVING RIGHT ALONG BOOK
Freq: Once | Status: AC
  Administered 2018-05-20: 11:00:00
  Filled 2018-05-20: qty 1

## 2018-05-20 MED ORDER — SODIUM CHLORIDE 0.9 % IV SOLN
250.0000 mL | INTRAVENOUS | Status: DC | PRN
  Administered 2018-05-23: 10:00:00 via INTRAVENOUS

## 2018-05-20 MED ORDER — MAGNESIUM HYDROXIDE 400 MG/5ML PO SUSP: 30.0000 mL | Freq: Every day | ORAL | Status: DC | PRN

## 2018-05-20 MED ORDER — SODIUM CHLORIDE 0.9% FLUSH: 3.0000 mL | INTRAVENOUS | Status: DC | PRN

## 2018-05-20 MED ORDER — ZOLPIDEM TARTRATE 5 MG PO TABS
5.0000 mg | ORAL_TABLET | Freq: Every evening | ORAL | Status: DC | PRN
  Administered 2018-05-20 – 2018-05-27 (×8): 5 mg via ORAL
  Filled 2018-05-20 (×8): qty 1

## 2018-05-20 MED ORDER — FE FUMARATE-B12-VIT C-FA-IFC PO CAPS
1.0000 | ORAL_CAPSULE | Freq: Three times a day (TID) | ORAL | Status: DC
Start: 2018-05-20 — End: 2018-05-28
  Administered 2018-05-20 – 2018-05-28 (×21): 1 via ORAL
  Filled 2018-05-20 (×24): qty 1

## 2018-05-20 MED ORDER — PATIENT'S GUIDE TO USING COUMADIN BOOK
Freq: Once | Status: DC
  Filled 2018-05-20 (×2): qty 1

## 2018-05-20 MED ORDER — SODIUM CHLORIDE 0.9% FLUSH
3.0000 mL | Freq: Two times a day (BID) | INTRAVENOUS | Status: DC
  Administered 2018-05-22 – 2018-05-25 (×5): 3 mL via INTRAVENOUS
  Administered 2018-05-26: 10 mL via INTRAVENOUS
  Administered 2018-05-26 – 2018-05-27 (×2): 3 mL via INTRAVENOUS
  Administered 2018-05-27 – 2018-05-28 (×2): 10 mL via INTRAVENOUS

## 2018-05-20 MED ORDER — COUMADIN BOOK
Freq: Once | Status: AC
  Administered 2018-05-20: 15:00:00
  Filled 2018-05-20: qty 1

## 2018-05-20 NOTE — Progress Notes (Signed)
Pt arrived from 2h. Telemetry box applied and CCMD notified x2. Vitals obtained. Epicardial pacing wires rolled and taped. Pt denies needs at this time. Will continue current plan of care.   Ardeen JourdainLauren Allura Doepke BSN, RN

## 2018-05-20 NOTE — Progress Notes (Signed)
Report called to lauren RN 4E

## 2018-05-20 NOTE — Discharge Instructions (Addendum)
MOUTH CARE AFTER SURGERY ° °FACTS: °· Ice used in ice bag helps keep the swelling down, and can help lessen the pain. °· It is easier to treat pain BEFORE it happens. °· Spitting disturbs the clot and may cause bleeding to start again, or to get worse. °· Smoking delays healing and can cause complications. °· Sharing prescriptions can be dangerous.  Do not take medications not recently prescribed for you. °· Antibiotics may stop birth control pills from working.  Use other means of birth control while on antibiotics. °· Warm salt water rinses after the first 24 hours will help lessen the swelling:  Use 1/2 teaspoonful of table salt per oz.of water. ° °DO NOT: °· Do not spit.  Do not drink through a straw. °· Strongly advised not to smoke, dip snuff or chew tobacco at least for 3 days. °· Do not eat sharp or crunchy foods.  Avoid the area of surgery when chewing. °· Do not stop your antibiotics before your instructions say to do so. °· Do not eat hot foods until bleeding has stopped.  If you need to, let your food cool down to room temperature. ° °EXPECT: °· Some swelling, especially first 2-3 days. °· Soreness or discomfort in varying degrees.  Follow your dentist's instructions about how to handle pain before it starts. °· Pinkish saliva or light blood in saliva, or on your pillow in the morning.  This can last around 24 hours. °· Bruising inside or outside the mouth.  This may not show up until 2-3 days after surgery.  Don't worry, it will go away in time. °· Pieces of "bone" may work themselves loose.  It's OK.  If they bother you, let us know. ° °WHAT TO DO IMMEDIATELY AFTER SURGERY: °· Bite on the gauze with steady pressure for 1-2 hours.  Don't chew on the gauze. °· Do not lie down flat.  Raise your head support especially for the first 24 hours. °· Apply ice to your face on the side of the surgery.  You may apply it 20 minutes on and a few minutes off.  Ice for 8-12 hours.  You may use ice up to 24  hours. °· Before the numbness wears off, take a pain pill as instructed. °· Prescription pain medication is not always required. ° °SWELLING: °· Expect swelling for the first couple of days.  It should get better after that. °· If swelling increases 3 days or so after surgery; let us know as soon as possible. ° °FEVER: °· Take Tylenol every 4 hours if needed to lower your temperature, especially if it is at 100F or higher. °· Drink lots of fluids. °· If the fever does not go away, let us know. ° °BREATHING TROUBLE: °· Any unusual difficulty breathing means you have to have someone bring you to the emergency room ASAP ° °BLEEDING: °· Light oozing is expected for 24 hours or so. °· Prop head up with pillows °· Avoid spitting °· Do not confuse bright red fresh flowing blood with lots of saliva colored with a little bit of blood. °· If you notice some bleeding, place gauze or a tea bag where it is bleeding and apply CONSTANT pressure by biting down for 1 hour.  Avoid talking during this time.  Do not remove the gauze or tea bag during this hour to "check" the bleeding. °· If you notice bright RED bleeding FLOWING out of particular area, and filling the floor of your mouth, put   a wad of gauze on that area, bite down firmly and constantly.  Call us immediately.  If we're closed, have someone bring you to the emergency room.  ORAL HYGIENE:  Brush your teeth as usual after meals and before bedtime.  Use a soft toothbrush around the area of surgery.  DO NOT AVOID BRUSHING.  Otherwise bacteria(germs) will grow and may delay healing or encourage infection.  Since you cannot spit, just gently rinse and let the water flow out of your mouth.  DO NOT SWISH HARD.  EATING:  Cool liquids are a good point to start.  Increase to soft foods as tolerated.  PRESCRIPTIONS:  Follow the directions for your prescriptions exactly as written.  If Dr. Kristin Bruins gave you a narcotic pain medication, do not drive, operate  machinery or drink alcohol when on that medication.  QUESTIONS:  Call our office during office hours [phone] or call the Emergency Room at [phone].  Information on my medicine - Coumadin   (Warfarin)  This medication education was reviewed with me or my healthcare representative as part of my discharge preparation.  The pharmacist that spoke with me during my hospital stay was:  Severiano Gilbert, [facility]  Why was Coumadin prescribed for you? Coumadin was prescribed for you because you have a blood clot or a medical condition that can cause an increased risk of forming blood clots. Blood clots can cause serious health problems by blocking the flow of blood to the heart, lung, or brain. Coumadin can prevent harmful blood clots from forming. As a reminder your indication for Coumadin is:   Blood Clot Prevention After Heart Valve Surgery  What test will check on my response to Coumadin? While on Coumadin (warfarin) you will need to have an INR test regularly to ensure that your dose is keeping you in the desired range. The INR (international normalized ratio) number is calculated from the result of the laboratory test called prothrombin time (PT).  If an INR APPOINTMENT HAS NOT ALREADY BEEN MADE FOR YOU please schedule an appointment to have this lab work done by your health care provider within 7 days. Your INR goal is usually a number between:  2 to 3 or your provider may give you a more narrow range like 2-2.5.  Ask your health care provider during an office visit what your goal INR is.  What  do you need to  know  About  COUMADIN? Take Coumadin (warfarin) exactly as prescribed by your healthcare provider about the same time each day.  DO NOT stop taking without talking to the doctor who prescribed the medication.  Stopping without other blood clot prevention medication to take the place of Coumadin may increase your risk of developing a new clot or stroke.  Get refills before you run  out.  What do you do if you miss a dose? If you miss a dose, take it as soon as you remember on the same day then continue your regularly scheduled regimen the next day.  Do not take two doses of Coumadin at the same time.  Important Safety Information A possible side effect of Coumadin (Warfarin) is an increased risk of bleeding. You should call your healthcare provider right away if you experience any of the following: ? Bleeding from an injury or your nose that does not stop. ? Unusual colored urine (red or dark brown) or unusual colored stools (red or black). ? Unusual bruising for unknown reasons. ? A serious fall or if you  hit your head (even if there is no bleeding).  Some foods or medicines interact with Coumadin (warfarin) and might alter your response to warfarin. To help avoid this: ? Eat a balanced diet, maintaining a consistent amount of Vitamin K. ? Notify your provider about major diet changes you plan to make. ? Avoid alcohol or limit your intake to 1 drink for women and 2 drinks for men per day. (1 drink is 5 oz. wine, 12 oz. beer, or 1.5 oz. liquor.)  Make sure that ANY health care provider who prescribes medication for you knows that you are taking Coumadin (warfarin).  Also make sure the healthcare provider who is monitoring your Coumadin knows when you have started a new medication including herbals and non-prescription products.  Coumadin (Warfarin)  Major Drug Interactions  Increased Warfarin Effect Decreased Warfarin Effect  Alcohol (large quantities) Antibiotics (esp. Septra/Bactrim, Flagyl, Cipro) Amiodarone (Cordarone) Aspirin (ASA) Cimetidine (Tagamet) Megestrol (Megace) NSAIDs (ibuprofen, naproxen, etc.) Piroxicam (Feldene) Propafenone (Rythmol SR) Propranolol (Inderal) Isoniazid (INH) Posaconazole (Noxafil) Barbiturates (Phenobarbital) Carbamazepine (Tegretol) Chlordiazepoxide (Librium) Cholestyramine (Questran) Griseofulvin Oral  Contraceptives Rifampin Sucralfate (Carafate) Vitamin K   Coumadin (Warfarin) Major Herbal Interactions  Increased Warfarin Effect Decreased Warfarin Effect  Garlic Ginseng Ginkgo biloba Coenzyme Q10 Green tea St. Johns wort    Coumadin (Warfarin) FOOD Interactions  Eat a consistent number of servings per week of foods HIGH in Vitamin K (1 serving =  cup)  Collards (cooked, or boiled & drained) Kale (cooked, or boiled & drained) Mustard greens (cooked, or boiled & drained) Parsley *serving size only =  cup Spinach (cooked, or boiled & drained) Swiss chard (cooked, or boiled & drained) Turnip greens (cooked, or boiled & drained)  Eat a consistent number of servings per week of foods MEDIUM-HIGH in Vitamin K (1 serving = 1 cup)  Asparagus (cooked, or boiled & drained) Broccoli (cooked, boiled & drained, or raw & chopped) Brussel sprouts (cooked, or boiled & drained) *serving size only =  cup Lettuce, raw (green leaf, endive, romaine) Spinach, raw Turnip greens, raw & chopped   These websites have more information on Coumadin (warfarin):  http://www.king-russell.com/www.coumadin.com; https://www.hines.net/www.ahrq.gov/consumer/coumadin.htm;   Discharge Instructions:  1. You may shower, please wash incisions daily with soap and water and keep dry.  If you wish to cover wounds with dressing you may do so but please keep clean and change daily.  No tub baths or swimming until incisions have completely healed.  If your incisions become red or develop any drainage please call our office at [phone]  2. No Driving until cleared by Dr. Sharee PimpleBartle's  office and you are no longer using narcotic pain medications  3. Monitor your weight daily.. Please use the same scale and weigh at same time... If you gain 5-10 lbs in 48 hours with associated lower extremity swelling, please contact our office at [phone]  4. Fever of 101.5 for at least 24 hours with no source, please contact our office at [phone]  5. Activity- up as  tolerated, please walk at least 3 times per day.  Avoid strenuous activity, no lifting, pushing, or pulling with your arms over 8-10 lbs for a minimum of 6 weeks  6. If any questions or concerns arise, please do not hesitate to contact our office at [phone]

## 2018-05-20 NOTE — Progress Notes (Signed)
2 Days Post-Op Procedure(s) (LRB): AORTIC VALVE REPLACEMENT (AVR) using On-X Size 21 Mechanical Valve (N/A) TRANSESOPHAGEAL ECHOCARDIOGRAM (TEE) (N/A) Subjective: Progressing after AVR for endocarditis Ao valve leaflet cultures no growth nsr Move to 4 E Objective: Vital signs in last 24 hours: Temp:  [97.8 F (36.6 C)-98.7 F (37.1 C)] 98.5 F (36.9 C) (06/09 0742) Pulse Rate:  [76-104] 89 (06/09 1000) Resp:  [14-39] 20 (06/09 1000) BP: (97-135)/(50-87) 116/80 (06/09 1002) SpO2:  [93 %-99 %] 95 % (06/09 1000)  Hemodynamic parameters for last 24 hours:    Intake/Output from previous day: 06/08 0701 - 06/09 0700 In: 1668.1 [P.O.:120; I.V.:898.1; IV Piggyback:450] Out: 4300 [Urine:4250; Chest Tube:50] Intake/Output this shift: Total I/O In: 101 [I.V.:101] Out: 950 [Urine:950]  No AI murmur Lungs clear  Lab Results: Recent Labs    05/19/18 1723 05/19/18 1731 05/20/18 0445  WBC 12.2*  --  8.8  HGB 8.3* 8.5* 7.2*  HCT 26.8* 25.0* 23.0*  PLT 188  --  157   BMET:  Recent Labs    05/19/18 0311  05/19/18 1731 05/20/18 0445  NA 134*  --  136 137  K 4.5  --  3.7 3.9  CL 103  --  98* 102  CO2 25  --   --  29  GLUCOSE 109*  --  106* 96  BUN 12  --  10 14  CREATININE 1.01   < > 0.80 1.01  CALCIUM 7.7*  --   --  8.3*   < > = values in this interval not displayed.    PT/INR:  Recent Labs    05/20/18 0445  LABPROT 14.3  INR 1.12   ABG    Component Value Date/Time   PHART 7.362 05/18/2018 1746   HCO3 22.8 05/18/2018 1746   TCO2 25 05/19/2018 1731   ACIDBASEDEF 3.0 (H) 05/18/2018 1746   O2SAT 99.0 05/18/2018 1746   CBG (last 3)  Recent Labs    05/19/18 1223 05/19/18 1938 05/19/18 2312  GLUCAP 120* 108* 85    Assessment/Plan: S/P Procedure(s) (LRB): AORTIC VALVE REPLACEMENT (AVR) using On-X Size 21 Mechanical Valve (N/A) TRANSESOPHAGEAL ECHOCARDIOGRAM (TEE) (N/A) Mobilize Diuresis Plan for transfer to step-down: see transfer orders coumadin for  mechanical AVR   LOS: 20 days    Kathlee Nationseter Van Trigt III 05/20/2018

## 2018-05-21 ENCOUNTER — Encounter (HOSPITAL_COMMUNITY): Payer: Self-pay | Admitting: Surgery

## 2018-05-21 ENCOUNTER — Inpatient Hospital Stay (HOSPITAL_COMMUNITY): Payer: Self-pay

## 2018-05-21 DIAGNOSIS — K5900 Constipation, unspecified: Secondary | ICD-10-CM

## 2018-05-21 DIAGNOSIS — F1190 Opioid use, unspecified, uncomplicated: Secondary | ICD-10-CM

## 2018-05-21 DIAGNOSIS — Z5181 Encounter for therapeutic drug level monitoring: Secondary | ICD-10-CM

## 2018-05-21 DIAGNOSIS — Z954 Presence of other heart-valve replacement: Secondary | ICD-10-CM

## 2018-05-21 LAB — CBC
HCT: 16.3 % — ABNORMAL LOW (ref 39.0–52.0)
HCT: 27.9 % — ABNORMAL LOW (ref 39.0–52.0)
Hemoglobin: 5.2 g/dL — CL (ref 13.0–17.0)
Hemoglobin: 9 g/dL — ABNORMAL LOW (ref 13.0–17.0)
MCH: 26.1 pg (ref 26.0–34.0)
MCH: 26.7 pg (ref 26.0–34.0)
MCHC: 31.9 g/dL (ref 30.0–36.0)
MCHC: 32.3 g/dL (ref 30.0–36.0)
MCV: 81.9 fL (ref 78.0–100.0)
MCV: 82.8 fL (ref 78.0–100.0)
PLATELETS: 223 10*3/uL (ref 150–400)
Platelets: 199 10*3/uL (ref 150–400)
RBC: 1.99 MIL/uL — ABNORMAL LOW (ref 4.22–5.81)
RBC: 3.37 MIL/uL — AB (ref 4.22–5.81)
RDW: 14.5 % (ref 11.5–15.5)
RDW: 14.5 % (ref 11.5–15.5)
WBC: 9.1 10*3/uL (ref 4.0–10.5)
WBC: 11.2 10*3/uL — AB (ref 4.0–10.5)

## 2018-05-21 LAB — BPAM RBC
BLOOD PRODUCT EXPIRATION DATE: 201906192359
Blood Product Expiration Date: 201906192359
Blood Product Expiration Date: 201906192359
Blood Product Expiration Date: 201906202359
ISSUE DATE / TIME: 201906070833
ISSUE DATE / TIME: 201906070833
UNIT TYPE AND RH: 600
UNIT TYPE AND RH: 600
UNIT TYPE AND RH: 600
Unit Type and Rh: 600

## 2018-05-21 LAB — PREPARE RBC (CROSSMATCH)

## 2018-05-21 LAB — BASIC METABOLIC PANEL
Anion gap: 6 (ref 5–15)
BUN: 18 mg/dL (ref 6–20)
CO2: 31 mmol/L (ref 22–32)
Calcium: 8.1 mg/dL — ABNORMAL LOW (ref 8.9–10.3)
Chloride: 99 mmol/L — ABNORMAL LOW (ref 101–111)
Creatinine, Ser: 1.24 mg/dL (ref 0.61–1.24)
GFR calc Af Amer: >60 mL/min (ref >60)
GFR calc non Af Amer: >60 mL/min (ref >60)
Glucose, Bld: 95 mg/dL (ref 65–99)
Potassium: 3.1 mmol/L — ABNORMAL LOW (ref 3.5–5.1)
Sodium: 136 mmol/L (ref 135–145)

## 2018-05-21 LAB — TYPE AND SCREEN
ABO/RH(D): A NEG
ANTIBODY SCREEN: NEGATIVE
UNIT DIVISION: 0
UNIT DIVISION: 0
Unit division: 0
Unit division: 0

## 2018-05-21 LAB — OCCULT BLOOD X 1 CARD TO LAB, STOOL: FECAL OCCULT BLD: POSITIVE — AB

## 2018-05-21 LAB — PROTIME-INR
INR: 1.05
Prothrombin Time: 13.6 seconds (ref 11.4–15.2)

## 2018-05-21 LAB — GLUCOSE, CAPILLARY: GLUCOSE-CAPILLARY: 104 mg/dL — AB (ref 65–99)

## 2018-05-21 LAB — GENTAMICIN LEVEL, TROUGH: GENTAMICIN TR: 1.3 ug/mL (ref 0.5–2.0)

## 2018-05-21 MED ORDER — LACTULOSE 10 GM/15ML PO SOLN
30.0000 g | Freq: Every day | ORAL | Status: DC | PRN
  Administered 2018-05-21: 30 g via ORAL
  Filled 2018-05-21: qty 45

## 2018-05-21 MED ORDER — GENTAMICIN IN SALINE 1.2-0.9 MG/ML-% IV SOLN
60.0000 mg | Freq: Two times a day (BID) | INTRAVENOUS | Status: DC
  Administered 2018-05-22: 60 mg via INTRAVENOUS
  Filled 2018-05-21 (×6): qty 50

## 2018-05-21 MED ORDER — SODIUM CHLORIDE 0.9 % IV SOLN: Freq: Once | INTRAVENOUS | Status: DC

## 2018-05-21 MED FILL — Magnesium Sulfate Inj 50%: INTRAMUSCULAR | Qty: 10 | Status: AC

## 2018-05-21 MED FILL — Phenylephrine HCl IV Soln 10 MG/ML: INTRAVENOUS | Qty: 2 | Status: AC

## 2018-05-21 MED FILL — Heparin Sodium (Porcine) Inj 1000 Unit/ML: INTRAMUSCULAR | Qty: 30 | Status: AC

## 2018-05-21 MED FILL — Potassium Chloride Inj 2 mEq/ML: INTRAVENOUS | Qty: 40 | Status: AC

## 2018-05-21 MED FILL — Sodium Chloride IV Soln 0.9%: INTRAVENOUS | Qty: 250 | Status: AC

## 2018-05-21 NOTE — Progress Notes (Signed)
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 Repeat blood cultures from 5/21 negative.  Tissue culture from OR 6/7 pending, but no organism seen on gram stain.  Last gentamicin trough was 0.6 mcg/ml on 6/3.  Repeat gent trough up to 1.3 today on Gentamicin 100 mg IV q12hrs. Creatinine has trended up.  Plan:  Decrease Gentamicin to 60 mg IV q12h for synergy dosing  Continue Ampicillin 2gm IV q4h  Watch renal function closely.  Plan for at least weekly gent trough, with goal <1 for synergy.  Height: 5\' 9"  (175.3 cm) Weight: 180 lb 8 oz (81.9 kg) IBW/kg (Calculated) : 70.7  Temp (24hrs), Avg:98.3 F (36.8 C), Min:97.7 F (36.5 C), Max:98.7 F (37.1 C)  Recent Labs  Lab 05/14/18 1741  05/18/18 1849  05/19/18 0311 05/19/18 1723 05/19/18 1731 05/20/18 0445 05/21/18 0625  WBC  --    < > 14.2*  --  11.2* 12.2*  --  8.8 9.1  CREATININE  --    < > 0.94   < > 1.01 0.86 0.80 1.01 1.24  GENTTROUGH 0.6  --   --   --   --   --   --   --  1.3   < > = values in this interval not displayed.    Estimated Creatinine Clearance: 82.4 mL/min (by C-G formula based on SCr of 1.24 mg/dL).    No Known Allergies  Jeffery Bennett, Jeffery Bennett Pager: [phone] or phone: [phone] 05/21/2018 11:54 AM

## 2018-05-21 NOTE — Progress Notes (Signed)
CARDIAC REHAB PHASE I   PRE:  Rate/Rhythm: 76 SR  BP:  Supine: 95/63  Sitting:   Standing:    SaO2: 96%RA  MODE:  Ambulation: 325 ft   POST:  Rate/Rhythm: 92 SR  BP:  Supine:   Sitting: 110/69  Standing:    SaO2: 96%RA 1432-1452 Pt walked 325 ft independently pushing IV pole. Tolerated well. No complaints.   Luetta Nuttingharlene Yarethzy Croak, RN BSN  05/21/2018 2:50 PM

## 2018-05-21 NOTE — Progress Notes (Signed)
[facility]  Date of Admission:  04/29/2018   Total days of antibiotics 21         Day 20 Ampicillin         Day 20 Gentamicin         Patient ID: Jeffery Bennett is a 37 y.o. male with  Principal Problem:   Enterococcal endocarditis of aortic valve Active Problems:   S/P AVR (aortic valve replacement)   Hepatitis C virus infection without hepatic coma   Polysubstance abuse (HCC)   Chest pain   Normochromic normocytic anemia   Pneumonitis   . acetaminophen  1,000 mg Oral Q6H   Or  . acetaminophen (TYLENOL) oral liquid 160 mg/5 mL  1,000 mg Per Tube Q6H  . aspirin EC  325 mg Oral Daily   Or  . aspirin  324 mg Per Tube Daily  . bisacodyl  10 mg Oral Daily   Or  . bisacodyl  10 mg Rectal Daily  . chlorhexidine gluconate (MEDLINE KIT)  15 mL Mouth Rinse BID  . Chlorhexidine Gluconate Cloth  6 each Topical Daily  . docusate sodium  200 mg Oral Daily  . ferrous [id number] C-folic acid  1 capsule Oral TID PC  . furosemide  40 mg Intravenous Daily  . magic mouthwash w/lidocaine  5 mL Oral QID  . mouth rinse  15 mL Mouth Rinse BID  . methadone  100 mg Oral Daily  . metolazone  5 mg Oral Daily  . metoprolol tartrate  25 mg Oral BID  . nicotine  21 mg Transdermal Daily  . pantoprazole  40 mg Oral Daily  . sodium chloride flush  3 mL Intravenous Q12H  . warfarin  5 mg Oral q1800  . Warfarin - Physician Dosing Inpatient   Does not apply q1800    SUBJECTIVE: Feels well overall. Pain well controlled on toradol. Cravings well controlled on methadone. Constipated - has not had a BM since Thursday.   No Known Allergies  OBJECTIVE: Vitals:   05/20/18 1822 05/20/18 2241 05/21/18 0526 05/21/18 0528  BP:  106/61 (!) 96/54 (!) 96/54  Pulse:  85 86   Resp:  18 17 (!) 21  Temp:  98.6 F (37 C) 98.7 F (37.1 C)   TempSrc:  Oral Oral   SpO2: 98% 94% 92%   Weight:    180 lb 8 oz (81.9 kg)  Height:       Body mass index is 26.66  kg/m.  Physical Exam  Constitutional: He is oriented to person, place, and time. He appears well-developed and well-nourished.  Seated comfortably in chair during visit.   HENT:  Mouth/Throat: Oropharynx is clear and moist and mucous membranes are normal. Normal dentition. No dental abscesses.  Cardiovascular: Normal rate and regular rhythm.  Murmur (systolic 1-1/0) heard. Pulmonary/Chest: Effort normal and breath sounds normal.  Abdominal: Soft. He exhibits no distension. There is no tenderness.  Lymphadenopathy:    He has no cervical adenopathy.  Neurological: He is alert and oriented to person, place, and time.  Skin: Skin is warm and dry. No rash noted.  Psychiatric: He has a normal mood and affect. Judgment normal.  In good spirits today and engaged in care discussion.     Lab Results Lab Results  Component Value Date   WBC 9.1 05/21/2018   HGB 5.2 (LL) 05/21/2018   HCT 16.3 (L) 05/21/2018   MCV 81.9 05/21/2018  PLT 199 05/21/2018    Lab Results  Component Value Date   CREATININE 1.24 05/21/2018   BUN 18 05/21/2018   NA 136 05/21/2018   K 3.1 (L) 05/21/2018   CL 99 (L) 05/21/2018   CO2 31 05/21/2018    Lab Results  Component Value Date   ALT 10 (L) 04/30/2018   AST 12 (L) 04/30/2018   ALKPHOS 66 04/30/2018   BILITOT 0.3 04/30/2018    Microbiology: BCx 5/19 >> enterococcus  5/21 >> negative  Valve Tissue 6/7 >> no growth   Assessment:  Jeffery Bennett is a 37 y.o. male now POD3 following aortic valve replacement for enterococcal endocarditis related to injection drug use. He is doing well from surgery standpoint aside from constipation he attributes mostly to the methadone. Reports the Toradol is working very well for his pain. Discussed today recommended treatment would be for 6 weeks following valve surgery (he was at about 2.5 weeks pre-op). He was politely frustrated that he may have to be here for another 5 weeks. We spent time discussing how both IV  therapies are ideal for treating this but high risk and require management to avoid side effects, not to mention we want him to be successful with least risk for reinfection. He understands. Also discussed outpatient tx of his hepatitis c infection today. He is appreciative we can help him with this.    Plan:  1. Enterococcal Aortic Valve Endocarditis = POD 3 following valve replacement. Continue ampicillin/gentamicin.   2. Medication Monitoring = baseline creatinine ~0.85. Up to 1.0 - 1.3 lately. Continue to watch closely.   3. Constipation = related to methadone. Receiving agents to help but without effect yet.   4. Chronic Hepatitis C = viral load 7.8 million. No genotype on file - will check in AM with next lab draw. Discussed outpatient treatment once he finishes treatment for his endocarditis.   5. Opioid Use Disorder = previously long term heroin user. Well controlled on methadone. Encouraged him to continue with therapy for this as well as getting plugged in outpatient for emotional support with addiction counseling.    Janene Madeira, MSN, NP-C [facility] [place] Cell: [phone] Pager: [phone]  05/21/18  11:28 AM

## 2018-05-21 NOTE — Progress Notes (Addendum)
[address]       [place] [address]             [phone]      3 Days Post-Op Procedure(s) (LRB): AORTIC VALVE REPLACEMENT (AVR) using On-X Size 21 Mechanical Valve (N/A) TRANSESOPHAGEAL ECHOCARDIOGRAM (TEE) (N/A) Subjective: Feels fine, minimal pain, + constipation  Objective: Vital signs in last 24 hours: Temp:  [97.7 F (36.5 C)-98.7 F (37.1 C)] 98.7 F (37.1 C) (06/10 0526) Pulse Rate:  [77-89] 86 (06/10 0526) Cardiac Rhythm: Normal sinus rhythm (06/09 1900) Resp:  [16-21] 21 (06/10 0528) BP: (96-123)/(50-81) 96/54 (06/10 0528) SpO2:  [92 %-98 %] 92 % (06/10 0526) Weight:  [81.9 kg (180 lb 8 oz)] 81.9 kg (180 lb 8 oz) (06/10 0528)  Hemodynamic parameters for last 24 hours:    Intake/Output from previous day: 06/09 0701 - 06/10 0700 In: 571.7 [I.V.:271.7; IV Piggyback:300] Out: 2250 [Urine:2250] Intake/Output this shift: No intake/output data recorded.  General appearance: alert, cooperative and no distress Heart: irregularly irregular rhythm and soft systolic murmur Lungs: slightly coarse throughout Abdomen: soft, +BS, nontender Extremities: no edema Wound: incis healing well  Lab Results: Recent Labs    05/20/18 0445 05/21/18 0625  WBC 8.8 9.1  HGB 7.2* 5.2*  HCT 23.0* 16.3*  PLT 157 199   BMET:  Recent Labs    05/19/18 0311  05/19/18 1731 05/20/18 0445  NA 134*  --  136 137  K 4.5  --  3.7 3.9  CL 103  --  98* 102  CO2 25  --   --  29  GLUCOSE 109*  --  106* 96  BUN 12  --  10 14  CREATININE 1.01   < > 0.80 1.01  CALCIUM 7.7*  --   --  8.3*   < > = values in this interval not displayed.    PT/INR:  Recent Labs    05/21/18 0625  LABPROT 13.6  INR 1.05   ABG    Component Value Date/Time   PHART 7.362 05/18/2018 1746   HCO3 22.8 05/18/2018 1746   TCO2 25 05/19/2018 1731   ACIDBASEDEF 3.0 (H) 05/18/2018 1746   O2SAT 99.0 05/18/2018 1746   CBG (last 3)  Recent Labs    05/19/18 1938 05/19/18 2312  05/20/18 0740  GLUCAP 108* 85 85    Meds Scheduled Meds: . acetaminophen  1,000 mg Oral Q6H   Or  . acetaminophen (TYLENOL) oral liquid 160 mg/5 mL  1,000 mg Per Tube Q6H  . aspirin EC  325 mg Oral Daily   Or  . aspirin  324 mg Per Tube Daily  . bisacodyl  10 mg Oral Daily   Or  . bisacodyl  10 mg Rectal Daily  . chlorhexidine gluconate (MEDLINE KIT)  15 mL Mouth Rinse BID  . Chlorhexidine Gluconate Cloth  6 each Topical Daily  . docusate sodium  200 mg Oral Daily  . ferrous [id number] C-folic acid  1 capsule Oral TID PC  . furosemide  40 mg Intravenous Daily  . magic mouthwash w/lidocaine  5 mL Oral QID  . mouth rinse  15 mL Mouth Rinse BID  . methadone  100 mg Oral Daily  . metolazone  5 mg Oral Daily  . metoprolol tartrate  25 mg Oral BID  . nicotine  21 mg Transdermal Daily  . pantoprazole  40 mg Oral Daily  . sodium chloride flush  3 mL Intravenous Q12H  .  warfarin  5 mg Oral q1800  . Warfarin - Physician Dosing Inpatient   Does not apply q1800   Continuous Infusions: . sodium chloride    . ampicillin (OMNIPEN) IV 2 g (05/21/18 0717)  . gentamicin Stopped (05/21/18 5361)  . lactated ringers 20 mL/hr at 05/20/18 0504   PRN Meds:.sodium chloride, ketorolac, magnesium hydroxide, metoprolol tartrate, midazolam, ondansetron (ZOFRAN) IV, oxyCODONE, sodium chloride flush, traMADol, zolpidem  Xrays Dg Chest Port 1 View  Result Date: 05/20/2018 CLINICAL DATA:  Aortic valve replacement EXAM: PORTABLE CHEST 1 VIEW COMPARISON:  Chest radiograph from one day prior. FINDINGS: Right PICC terminates at the cavoatrial junction. Intact sternotomy wires. Stable cardiomediastinal silhouette with mild cardiomegaly. No pneumothorax. Trace bilateral pleural effusions, stable. No overt pulmonary edema. Mild bibasilar atelectasis, improved bilaterally. IMPRESSION: 1. Stable mild cardiomegaly without overt pulmonary edema. 2. Stable trace bilateral pleural effusions. 3. Mild bibasilar  atelectasis, improved bilaterally. Electronically Signed   By: Ilona Sorrel M.D.   On: 05/20/2018 07:47   Results for orders placed or performed during the hospital encounter of 04/29/18  Blood culture (routine x 2)     Status: Abnormal   Collection Time: 04/29/18  6:46 PM  Result Value Ref Range Status   Specimen Description BLOOD BLOOD RIGHT FOREARM  Final   Special Requests   Final    BOTTLES DRAWN AEROBIC AND ANAEROBIC Blood Culture adequate volume   Culture  Setup Time   Final    GRAM POSITIVE COCCI IN CHAINS IN BOTH AEROBIC AND ANAEROBIC BOTTLES CRITICAL RESULT CALLED TO, READ BACK BY AND VERIFIED WITH: MARTIN PHARMD AT 4431 ON 052019 BY SJW PT IN ED ADMITTED TO [place] CALLED [facility] AND [place] Performed at [facility], [place] [address] [place], [place] [address]    Culture ENTEROCOCCUS FAECALIS (A)  Final   Report Status 05/02/2018 FINAL  Final   Organism ID, Bacteria ENTEROCOCCUS FAECALIS  Final      Susceptibility   Enterococcus faecalis - MIC*    AMPICILLIN <=2 SENSITIVE Sensitive     VANCOMYCIN 1 SENSITIVE Sensitive     GENTAMICIN SYNERGY SENSITIVE Sensitive     * ENTEROCOCCUS FAECALIS  Blood Culture ID Panel (Reflexed)     Status: Abnormal   Collection Time: 04/29/18  6:46 PM  Result Value Ref Range Status   Enterococcus species DETECTED (A) NOT DETECTED Final    Comment: CRITICAL RESULT CALLED TO, READ BACK BY AND VERIFIED WITH: KELLY CONNOR RN AT 6761 ON [id number] BY SJW    Vancomycin resistance NOT DETECTED NOT DETECTED Final   Listeria monocytogenes NOT DETECTED NOT DETECTED Final   Staphylococcus species NOT DETECTED NOT DETECTED Final   Staphylococcus aureus NOT DETECTED NOT DETECTED Final   Streptococcus species NOT DETECTED NOT DETECTED Final   Streptococcus agalactiae NOT DETECTED NOT DETECTED Final   Streptococcus pneumoniae NOT DETECTED NOT DETECTED Final   Streptococcus pyogenes NOT DETECTED NOT DETECTED Final   Acinetobacter baumannii NOT DETECTED NOT DETECTED  Final   Enterobacteriaceae species NOT DETECTED NOT DETECTED Final   Enterobacter cloacae complex NOT DETECTED NOT DETECTED Final   Escherichia coli NOT DETECTED NOT DETECTED Final   Klebsiella oxytoca NOT DETECTED NOT DETECTED Final   Klebsiella pneumoniae NOT DETECTED NOT DETECTED Final   Proteus species NOT DETECTED NOT DETECTED Final   Serratia marcescens NOT DETECTED NOT DETECTED Final   Haemophilus influenzae NOT DETECTED NOT DETECTED Final   Neisseria meningitidis NOT DETECTED NOT DETECTED Final   Pseudomonas aeruginosa NOT  DETECTED NOT DETECTED Final   Candida albicans NOT DETECTED NOT DETECTED Final   Candida glabrata NOT DETECTED NOT DETECTED Final   Candida krusei NOT DETECTED NOT DETECTED Final   Candida parapsilosis NOT DETECTED NOT DETECTED Final   Candida tropicalis NOT DETECTED NOT DETECTED Final    Comment: Performed at [facility], [place] [address] [place], [place] [address]  Culture, blood (routine x 2)     Status: None   Collection Time: 05/01/18  9:27 PM  Result Value Ref Range Status   Specimen Description BLOOD LEFT ANTECUBITAL  Final   Special Requests   Final    BOTTLES DRAWN AEROBIC AND ANAEROBIC Blood Culture adequate volume   Culture   Final    NO GROWTH 5 DAYS Performed at [facility], [place] [address] [place], [place] [address]    Report Status 05/06/2018 FINAL  Final  Culture, blood (routine x 2)     Status: None   Collection Time: 05/01/18  9:32 PM  Result Value Ref Range Status   Specimen Description BLOOD LEFT FOREARM  Final   Special Requests   Final    BOTTLES DRAWN AEROBIC AND ANAEROBIC Blood Culture adequate volume   Culture   Final    NO GROWTH 5 DAYS Performed at [facility], [place] [address] [place], [place] [address]    Report Status 05/06/2018 FINAL  Final  Surgical pcr screen     Status: None   Collection Time: 05/15/18 12:01 PM  Result Value Ref Range Status   MRSA, PCR NEGATIVE NEGATIVE Final   Staphylococcus  aureus NEGATIVE NEGATIVE Final    Comment: (NOTE) The Xpert SA Assay (FDA approved for NASAL specimens in patients 63 years of age and older), is one component of a comprehensive surveillance program. It is not intended to diagnose infection nor to guide or monitor treatment. Performed at [facility], [place] [address] [place], [place] [address]   Aerobic/Anaerobic Culture (surgical/deep wound)     Status: None (Preliminary result)   Collection Time: 05/18/18  9:37 AM  Result Value Ref Range Status   Specimen Description TISSUE AORTIC VALVE LEAFLETS  Final   Special Requests SPEC A  Final   Gram Stain   Final    FEW WBC PRESENT,BOTH PMN AND MONONUCLEAR NO ORGANISMS SEEN    Culture   Final    CULTURE REINCUBATED FOR BETTER GROWTH Performed at [facility], [address], [place], [place] [address]    Report Status PENDING  Incomplete   Assessment/Plan: S/P Procedure(s) (LRB): AORTIC VALVE REPLACEMENT (AVR) using On-X Size 21 Mechanical Valve (N/A) TRANSESOPHAGEAL ECHOCARDIOGRAM (TEE) (N/A)  1 doing well, hemodyn stable in sinus rhythm, BP in 90's to low 100's 2 HGB dropped to 5.2 will transfuse 1 unit and guiac stools 3 INR low , will go slow with potential bleeding- has mechanical valve 4 cont rehab and pulm toilet 5 cont diuretic- poss d/c soon  LOS: 21 days    Wayne E Gold 6/10/2019postop Hb dropping- will need 2 U PRBC for Hb 5   patient examined and medical record reviewed,agree with above note. Tharon Aquas Trigt III 05/21/2018

## 2018-05-21 NOTE — Progress Notes (Signed)
CRITICAL VALUE ALERT  Critical Value:  hgb 5.2  Date & Time Notied:  05/21/18 07:05  Provider Notified: Deniece PortelaWayne PA  Orders Received/Actions taken: Pending

## 2018-05-22 ENCOUNTER — Encounter (HOSPITAL_COMMUNITY)
Admission: EM | Discharge status: Against Medical Advice | Payer: Self-pay | Source: Home / Self Care | Attending: Surgery

## 2018-05-22 DIAGNOSIS — B1920 Unspecified viral hepatitis C without hepatic coma: Secondary | ICD-10-CM

## 2018-05-22 DIAGNOSIS — Z952 Presence of prosthetic heart valve: Secondary | ICD-10-CM

## 2018-05-22 DIAGNOSIS — R011 Cardiac murmur, unspecified: Secondary | ICD-10-CM

## 2018-05-22 LAB — CBC
HEMATOCRIT: 24 % — AB (ref 39.0–52.0)
HEMOGLOBIN: 7.7 g/dL — AB (ref 13.0–17.0)
MCH: 26.5 pg (ref 26.0–34.0)
MCHC: 32.1 g/dL (ref 30.0–36.0)
MCV: 82.5 fL (ref 78.0–100.0)
Platelets: 216 10*3/uL (ref 150–400)
RBC: 2.91 MIL/uL — ABNORMAL LOW (ref 4.22–5.81)
RDW: 14.5 % (ref 11.5–15.5)
WBC: 6.5 10*3/uL (ref 4.0–10.5)

## 2018-05-22 LAB — PROTIME-INR
INR: 1.54
Prothrombin Time: 18.4 seconds — ABNORMAL HIGH (ref 11.4–15.2)

## 2018-05-22 SURGERY — ENTEROSCOPY
Anesthesia: Monitor Anesthesia Care

## 2018-05-22 MED ORDER — POTASSIUM CHLORIDE CRYS ER 10 MEQ PO TBCR
30.0000 meq | EXTENDED_RELEASE_TABLET | Freq: Two times a day (BID) | ORAL | Status: AC
  Administered 2018-05-22 (×2): 30 meq via ORAL
  Filled 2018-05-22 (×2): qty 1

## 2018-05-22 MED ORDER — PANTOPRAZOLE SODIUM 40 MG PO TBEC
40.0000 mg | DELAYED_RELEASE_TABLET | Freq: Every day | ORAL | Status: DC
  Administered 2018-05-22 – 2018-05-28 (×7): 40 mg via ORAL
  Filled 2018-05-22 (×7): qty 1

## 2018-05-22 MED ORDER — GENTAMICIN IN SALINE 1.2-0.9 MG/ML-% IV SOLN
60.0000 mg | Freq: Two times a day (BID) | INTRAVENOUS | Status: DC
  Administered 2018-05-22 – 2018-05-28 (×12): 60 mg via INTRAVENOUS
  Filled 2018-05-22 (×19): qty 50

## 2018-05-22 MED ORDER — GENTAMICIN IN SALINE 1.2-0.9 MG/ML-% IV SOLN: 60.0000 mg | Freq: Two times a day (BID) | INTRAVENOUS | Status: DC

## 2018-05-22 MED ORDER — PANTOPRAZOLE SODIUM 40 MG IV SOLR: 40.0000 mg | Freq: Two times a day (BID) | INTRAVENOUS | Status: DC

## 2018-05-22 MED FILL — Sodium Chloride IV Soln 0.9%: INTRAVENOUS | Qty: 2000 | Status: AC

## 2018-05-22 MED FILL — Electrolyte-R (PH 7.4) Solution: INTRAVENOUS | Qty: 3000 | Status: AC

## 2018-05-22 MED FILL — Lidocaine HCl Local Soln Prefilled Syringe 100 MG/5ML (2%): INTRAMUSCULAR | Qty: 10 | Status: AC

## 2018-05-22 MED FILL — Heparin Sodium (Porcine) Inj 1000 Unit/ML: INTRAMUSCULAR | Qty: 10 | Status: AC

## 2018-05-22 MED FILL — Sodium Bicarbonate IV Soln 8.4%: INTRAVENOUS | Qty: 50 | Status: AC

## 2018-05-22 MED FILL — Mannitol IV Soln 20%: INTRAVENOUS | Qty: 500 | Status: AC

## 2018-05-22 NOTE — Progress Notes (Signed)
Nutrition Follow Up  DOCUMENTATION CODES:   Not applicable  INTERVENTION:    Continue Heart Healthy diet  NUTRITION DIAGNOSIS:   Increased nutrient needs related to post-op healing, acute illness as evidenced by estimated needs, ongoing  GOAL:   Patient will meet greater than or equal to 90% of their needs, met  MONITOR:   PO intake, Labs, Skin, Weight trends, I & O's  ASSESSMENT:   37 y.o. M admitted on 04/29/18 for endocarditis and bacteremia. 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 Transesophageal echocardiogram 5/24. PMH of hep C, normocytic anemia, pneumonitis, endocarditis.   Pt s/p procedures 6/7: MEDIAN STERNOTOMY AORTIC VALVE REPLACEMENT  Pt extubated in the late afternoon 6/7. Advanced to Clear Liquids then Heart Healthy diet 6/9. PO intake mostly good at 100% per flowsheet records.  He was having constipation yesterday AM and didn't eat well. GI note reviewed. Possible EGD or colonoscopy. Labs and medications reviewed.  Diet Order:   Diet Order           Diet NPO time specified  Diet effective midnight        Diet Heart Room service appropriate? Yes; Fluid consistency: Thin  Diet effective now         EDUCATION NEEDS:   Not appropriate for education at this time  Skin:  Skin Assessment: Skin Integrity Issues: Skin Integrity Issues:: Incisions Incisions: chest  Last BM:  6/10   Intake/Output Summary (Last 24 hours) at 05/22/2018 1235 Last data filed at 05/22/2018 0900 Gross per 24 hour  Intake 1670 ml  Output -  Net 1670 ml   Height:   Ht Readings from Last 1 Encounters:  05/04/18 '5\' 9"'  (1.753 m)   Weight:   Wt Readings from Last 1 Encounters:  05/22/18 177 lb 4.8 oz (80.4 kg)   Ideal Body Weight:  72.72 kg  BMI:  Body mass index is 26.18 kg/m.  Estimated Nutritional Needs:   Kcal:  2000-2200 kcal  Protein:  95-110 grams  Fluid:  >/= 2.1 L or per MD  Arthur Holms, RD, LDN Pager  #: [phone] After-Hours Pager #: [phone]

## 2018-05-22 NOTE — Progress Notes (Signed)
CARDIAC REHAB PHASE I   PRE:  Rate/Rhythm: 78 SR  BP:  Supine:   Sitting: 116/71  Standing:    SaO2: 96%RA  MODE:  Ambulation: 430 ft   POST:  Rate/Rhythm: 80 SR  BP:  Supine:   Sitting: 114/78  Standing:    SaO2: 96%RA 1340-1352 Pt stated he has walked about 4 times already. Pt walked 430 ft on RA with steady gait. Encouraged him to walk farther to increase endurance. Tolerated well.   Luetta Nuttingharlene Rahul Malinak, RN BSN  05/22/2018 1:49 PM

## 2018-05-22 NOTE — Progress Notes (Addendum)
[facility] for Infectious Disease  Date of Admission:  04/29/2018   Total days of antibiotics 22         Day 20 Ampicillin         Day 20 Gentamicin         Patient ID: Jeffery Bennett is a 37 y.o. male with  Principal Problem:   Enterococcal endocarditis of aortic valve Active Problems:   S/P AVR (aortic valve replacement)   Hepatitis C virus infection without hepatic coma   Polysubstance abuse (HCC)   Chest pain   Normochromic normocytic anemia   Pneumonitis   . acetaminophen  1,000 mg Oral Q6H   Or  . acetaminophen (TYLENOL) oral liquid 160 mg/5 mL  1,000 mg Per Tube Q6H  . bisacodyl  10 mg Oral Daily   Or  . bisacodyl  10 mg Rectal Daily  . chlorhexidine gluconate (MEDLINE KIT)  15 mL Mouth Rinse BID  . Chlorhexidine Gluconate Cloth  6 each Topical Daily  . docusate sodium  200 mg Oral Daily  . ferrous [id number] C-folic acid  1 capsule Oral TID PC  . furosemide  40 mg Intravenous Daily  . magic mouthwash w/lidocaine  5 mL Oral QID  . mouth rinse  15 mL Mouth Rinse BID  . methadone  100 mg Oral Daily  . metolazone  5 mg Oral Daily  . metoprolol tartrate  25 mg Oral BID  . nicotine  21 mg Transdermal Daily  . pantoprazole  40 mg Oral Q0600  . potassium chloride  30 mEq Oral BID  . sodium chloride flush  3 mL Intravenous Q12H  . Warfarin - Physician Dosing Inpatient   Does not apply q1800    SUBJECTIVE: Doing well today. Feeling like he has more energy. Walked several times yesterday. Would like to request just going outside periodically with staff member to help with completing therapy here.    No Known Allergies  OBJECTIVE: Vitals:   05/21/18 1930 05/22/18 0325 05/22/18 0605 05/22/18 1206  BP: (!) 98/58 95/63  107/76  Pulse: 94 94  79  Resp: (!) 21 (!) _0 Temp: 98.8 F (37.1 C) 97.9 F (36.6 C)  97.6 F (36.4 C)  TempSrc: Oral Oral  Oral  SpO2: 93% 97%  99%  Weight:  177 lb 4.8 oz (80.4 kg) 177 lb 4.8 oz (80.4 kg)     Height:       Body mass index is 26.18 kg/m.  Physical Exam  Constitutional: He is oriented to person, place, and time. He appears well-developed and well-nourished.  Resting comfortably in bed.   HENT:  Mouth/Throat: Oropharynx is clear and moist and mucous membranes are normal. Normal dentition. No dental abscesses.  Eyes: Pupils are equal, round, and reactive to light. No scleral icterus.  Cardiovascular: Normal rate and regular rhythm.  Murmur (systolic 8-4/6) heard. Pulmonary/Chest: Effort normal and breath sounds normal.  Abdominal: Soft. Bowel sounds are normal. He exhibits no distension. There is no tenderness.  Lymphadenopathy:    He has no cervical adenopathy.  Neurological: He is alert and oriented to person, place, and time.  Skin: Skin is warm and dry. No rash noted.  Psychiatric: He has a normal mood and affect. Judgment normal.    Lab Results Lab Results  Component Value Date   WBC 6.5 05/22/2018   HGB 7.7 (L) 05/22/2018   HCT 24.0 (L) 05/22/2018   MCV 82.5 05/22/2018  PLT 216 05/22/2018    Lab Results  Component Value Date   CREATININE 1.24 05/21/2018   BUN 18 05/21/2018   NA 136 05/21/2018   K 3.1 (L) 05/21/2018   CL 99 (L) 05/21/2018   CO2 31 05/21/2018    Lab Results  Component Value Date   ALT 10 (L) 04/30/2018   AST 12 (L) 04/30/2018   ALKPHOS 66 04/30/2018   BILITOT 0.3 04/30/2018    Microbiology: BCx 5/19 >> enterococcus  5/21 >> negative  Valve Tissue 6/7 >> rare coagulase negative staph    Assessment:  Jeffery Bennett is a 36 y.o. male now POD4 following aortic valve replacement for enterococcal endocarditis related to injection drug use. He is doing well from surgery standpoint aside from constipation he attributes mostly to the methadone. Reports the Toradol is working very well for his pain. Tissue from intraop cultures with rare coagulase negative staph which is surprising - await sensitivities. May need to consider vanco/gent vs  daptomycin to complete treatment. Duration of antibiotics IV minimum would be 4 weeks following surgery but 6 weeks would be preferred.   Unfortunately his methadone need will remove our case manager's ability to find placement to complete therapy and he will need to stay in house for this. Discussed this with him today and he does not want to come off methadone to consider placement as he is relatively new to abstaining.   Outpatient tx of his hepatitis c infection again discusscussed. He is appreciative we can help him with this.    Plan:  1. Enterococcal Aortic Valve Endocarditis = POD 4 following valve replacement. Continue ampicillin/gentamicin as long as kidneys can tolerate.   2. Medication Monitoring = baseline creatinine ~0.85. Up to 1.0 - 1.3 lately. Continue to watch closely. Would recommend at least 2x weekly creatinine levels while on gentamicin.   3. Constipation = related to methadone. Receiving agents to help but without effect yet.   4. Chronic Hepatitis C = viral load 7.8 million. Genotype pending. Discussed outpatient treatment once he finishes treatment for his endocarditis.   5. Opioid Use Disorder = previously long term heroin user. Well controlled on methadone. Encouraged him to continue with therapy for this as well as getting plugged in outpatient for emotional support with addiction counseling.    Stephanie Dixon, MSN, NP-C [facility] Cell: [phone] Pager: [phone]  05/22/18  1:41 PM    

## 2018-05-22 NOTE — Progress Notes (Addendum)
[address]       [place] [address]             [phone]      4 Days Post-Op Procedure(s) (LRB): AORTIC VALVE REPLACEMENT (AVR) using On-X Size 21 Mechanical Valve (N/A) TRANSESOPHAGEAL ECHOCARDIOGRAM (TEE) (N/A) Subjective: Feels fine, HGB better after transfusion but down a little further after initial bump, heme + stool  Objective: Vital signs in last 24 hours: Temp:  [97.7 F (36.5 C)-98.8 F (37.1 C)] 97.9 F (36.6 C) (06/11 0325) Pulse Rate:  [71-95] 94 (06/11 0325) Cardiac Rhythm: Normal sinus rhythm (06/10 1900) Resp:  [13-22] 20 (06/11 0605) BP: (94-103)/(58-72) 95/63 (06/11 0325) SpO2:  [93 %-98 %] 97 % (06/11 0325) Weight:  [80.4 kg (177 lb 4.8 oz)] 80.4 kg (177 lb 4.8 oz) (06/11 0605)  Hemodynamic parameters for last 24 hours:    Intake/Output from previous day: 06/10 0701 - 06/11 0700 In: 1970 [P.O.:700; I.V.:50; Blood:620; IV Piggyback:600] Out: -  Intake/Output this shift: No intake/output data recorded.  General appearance: alert, cooperative and no distress Heart: regular rate and rhythm Lungs: mildly dim in right base Abdomen: soft, non-tender, + BS Extremities: no edema Wound: incis healing well  Lab Results: Recent Labs    05/21/18 1719 05/22/18 0320  WBC 11.2* 6.5  HGB 9.0* 7.7*  HCT 27.9* 24.0*  PLT 223 216   BMET:  Recent Labs    05/20/18 0445 05/21/18 0625  NA 137 136  K 3.9 3.1*  CL 102 99*  CO2 29 31  GLUCOSE 96 95  BUN 14 18  CREATININE 1.01 1.24  CALCIUM 8.3* 8.1*    PT/INR:  Recent Labs    05/22/18 0320  LABPROT 18.4*  INR 1.54   ABG    Component Value Date/Time   PHART 7.362 05/18/2018 1746   HCO3 22.8 05/18/2018 1746   TCO2 25 05/19/2018 1731   ACIDBASEDEF 3.0 (H) 05/18/2018 1746   O2SAT 99.0 05/18/2018 1746   CBG (last 3)  Recent Labs    05/19/18 1938 05/19/18 2312 05/20/18 0740  GLUCAP 108* 85 85    Meds Scheduled Meds: . acetaminophen  1,000 mg Oral Q6H   Or    . acetaminophen (TYLENOL) oral liquid 160 mg/5 mL  1,000 mg Per Tube Q6H  . aspirin EC  325 mg Oral Daily   Or  . aspirin  324 mg Per Tube Daily  . bisacodyl  10 mg Oral Daily   Or  . bisacodyl  10 mg Rectal Daily  . chlorhexidine gluconate (MEDLINE KIT)  15 mL Mouth Rinse BID  . Chlorhexidine Gluconate Cloth  6 each Topical Daily  . docusate sodium  200 mg Oral Daily  . ferrous [id number] C-folic acid  1 capsule Oral TID PC  . furosemide  40 mg Intravenous Daily  . magic mouthwash w/lidocaine  5 mL Oral QID  . mouth rinse  15 mL Mouth Rinse BID  . methadone  100 mg Oral Daily  . metolazone  5 mg Oral Daily  . metoprolol tartrate  25 mg Oral BID  . nicotine  21 mg Transdermal Daily  . pantoprazole  40 mg Oral Daily  . sodium chloride flush  3 mL Intravenous Q12H  . warfarin  5 mg Oral q1800  . Warfarin - Physician Dosing Inpatient   Does not apply q1800   Continuous Infusions: . sodium chloride    . sodium chloride    . ampicillin (  OMNIPEN) IV Stopped (05/22/18 7493)  . gentamicin 60 mg (05/22/18 0659)  . lactated ringers 20 mL/hr at 05/20/18 0504   PRN Meds:.sodium chloride, ketorolac, lactulose, magnesium hydroxide, metoprolol tartrate, midazolam, ondansetron (ZOFRAN) IV, oxyCODONE, sodium chloride flush, traMADol, zolpidem  Xrays Dg Chest 2 View  Result Date: 05/21/2018 CLINICAL DATA:  Noted valve replacement on 05/18/2018, smoking history, history of hepatitis C EXAM: CHEST - 2 VIEW COMPARISON:  Portable chest x-ray of 05/20/2017 FINDINGS: There is a small right pleural effusion present with mild bibasilar atelectasis noted. Linear atelectasis overlies the left infrahilar region and mild cardiomegaly is stable. Median sternotomy sutures are present and aortic valve replacement is noted. Right central venous line tip overlies the lower SVC. IMPRESSION: 1. Small right pleural effusion with bibasilar atelectasis remaining. 2. Right PICC line tip overlies the lower SVC.  Electronically Signed   By: Ivar Drape M.D.   On: 05/21/2018 09:00    Assessment/Plan: S/P Procedure(s) (LRB): AORTIC VALVE REPLACEMENT (AVR) using On-X Size 21 Mechanical Valve (N/A) TRANSESOPHAGEAL ECHOCARDIOGRAM (TEE) (N/A)  1 overall doing well, hemodyn stable in sinus rhythm 2 will need GI to see, he is on protonix, will change to IV 3 Cont abx as directed by ID 4 INR 1.54, go slow with coumadin, has ONYX valve so needs lower INR- will place on hold for now 5 pulm toilet and routine rehab  LOS: 22 days    Jeffery Bennett 05/22/2018   Lab Results  Component Value Date   INR 1.54 05/22/2018   INR 1.05 05/21/2018   INR 1.12 05/20/2018    I have seen and examined [name] and agree with the above assessment  and plan.  Grace Isaac MD Bee[facility] [phone] Office [phone] 05/22/2018 6:20 PM

## 2018-05-22 NOTE — Plan of Care (Signed)
Care plans reviewed and patient is progressing.  
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
## 2018-05-22 NOTE — Progress Notes (Signed)
Pt received 1 Unit PRBCs yesterday for Hgb of 5.2. After the infusion, Hgb rose to 9.0.  This morning the Hgb is 7.7.  The fecal occult blood test was positive.   Harriet Massonavidson, Teddi Badalamenti E, RN

## 2018-05-23 ENCOUNTER — Inpatient Hospital Stay (HOSPITAL_COMMUNITY): Payer: Self-pay | Admitting: Anesthesiology

## 2018-05-23 ENCOUNTER — Encounter (HOSPITAL_COMMUNITY): Payer: Self-pay | Admitting: *Deleted

## 2018-05-23 ENCOUNTER — Encounter (HOSPITAL_COMMUNITY)
Admission: EM | Discharge status: Against Medical Advice | Payer: Self-pay | Source: Home / Self Care | Attending: Surgery

## 2018-05-23 DIAGNOSIS — K3189 Other diseases of stomach and duodenum: Secondary | ICD-10-CM

## 2018-05-23 HISTORY — PX: ENTEROSCOPY: SHX5533

## 2018-05-23 HISTORY — PX: BIOPSY: SHX5522

## 2018-05-23 LAB — CBC
HEMATOCRIT: 26.1 % — AB (ref 39.0–52.0)
HEMOGLOBIN: 8.4 g/dL — AB (ref 13.0–17.0)
MCH: 26.4 pg (ref 26.0–34.0)
MCHC: 32.2 g/dL (ref 30.0–36.0)
MCV: 82.1 fL (ref 78.0–100.0)
Platelets: 295 10*3/uL (ref 150–400)
RBC: 3.18 MIL/uL — ABNORMAL LOW (ref 4.22–5.81)
RDW: 14.4 % (ref 11.5–15.5)
WBC: 7.8 10*3/uL (ref 4.0–10.5)

## 2018-05-23 LAB — BASIC METABOLIC PANEL
Anion gap: 10 (ref 5–15)
BUN: 17 mg/dL (ref 6–20)
CO2: 34 mmol/L — AB (ref 22–32)
CREATININE: 1.32 mg/dL — AB (ref 0.61–1.24)
Calcium: 8.9 mg/dL (ref 8.9–10.3)
Chloride: 94 mmol/L — ABNORMAL LOW (ref 101–111)
GFR calc non Af Amer: >60 mL/min (ref >60)
Glucose, Bld: 134 mg/dL — ABNORMAL HIGH (ref 65–99)
POTASSIUM: 3.4 mmol/L — AB (ref 3.5–5.1)
Sodium: 138 mmol/L (ref 135–145)

## 2018-05-23 LAB — PROTIME-INR
INR: 1.28
Prothrombin Time: 15.9 seconds — ABNORMAL HIGH (ref 11.4–15.2)

## 2018-05-23 SURGERY — ENTEROSCOPY
Anesthesia: Monitor Anesthesia Care

## 2018-05-23 MED ORDER — GENTAMICIN IN SALINE 1.2-0.9 MG/ML-% IV SOLN: 60.0000 mg | Freq: Two times a day (BID) | INTRAVENOUS | Status: DC

## 2018-05-23 MED ORDER — MIDAZOLAM HCL 2 MG/2ML IJ SOLN: 0.5000 mg | Freq: Once | INTRAMUSCULAR | Status: DC | PRN

## 2018-05-23 MED ORDER — PROPOFOL 500 MG/50ML IV EMUL
INTRAVENOUS | Status: DC | PRN
  Administered 2018-05-23: 80 ug/kg/min via INTRAVENOUS

## 2018-05-23 MED ORDER — LIDOCAINE HCL (CARDIAC) PF 100 MG/5ML IV SOSY
PREFILLED_SYRINGE | INTRAVENOUS | Status: DC | PRN
  Administered 2018-05-23: 100 mg via INTRAVENOUS

## 2018-05-23 MED ORDER — MEPERIDINE HCL 25 MG/ML IJ SOLN: 6.2500 mg | INTRAMUSCULAR | Status: DC | PRN

## 2018-05-23 MED ORDER — PROPOFOL 10 MG/ML IV BOLUS
INTRAVENOUS | Status: DC | PRN
  Administered 2018-05-23 (×2): 30 mg via INTRAVENOUS
  Administered 2018-05-23: 50 mg via INTRAVENOUS

## 2018-05-23 MED ORDER — PEG-KCL-NACL-NASULF-NA ASC-C 100 G PO SOLR
1.0000 | Freq: Once | ORAL | Status: DC
  Filled 2018-05-23: qty 1

## 2018-05-23 MED ORDER — MAGNESIUM SULFATE 2 GM/50ML IV SOLN: 2.0000 g | Freq: Once | INTRAVENOUS | Status: DC

## 2018-05-23 MED ORDER — LIDOCAINE HCL (CARDIAC) PF 100 MG/5ML IV SOSY: PREFILLED_SYRINGE | INTRAVENOUS | Status: DC | PRN

## 2018-05-23 MED ORDER — POTASSIUM CHLORIDE CRYS ER 20 MEQ PO TBCR
40.0000 meq | EXTENDED_RELEASE_TABLET | Freq: Once | ORAL | Status: AC
  Administered 2018-05-23: 40 meq via ORAL
  Filled 2018-05-23: qty 2

## 2018-05-23 MED ORDER — PROMETHAZINE HCL 25 MG/ML IJ SOLN: 6.2500 mg | INTRAMUSCULAR | Status: DC | PRN

## 2018-05-23 MED ORDER — WARFARIN SODIUM 2.5 MG PO TABS
2.5000 mg | ORAL_TABLET | Freq: Once | ORAL | Status: AC
  Administered 2018-05-23: 2.5 mg via ORAL
  Filled 2018-05-23: qty 1

## 2018-05-23 NOTE — Progress Notes (Addendum)
[address]       [place] [address]             [phone]      5 Days Post-Op Procedure(s) (LRB): AORTIC VALVE REPLACEMENT (AVR) using On-X Size 21 Mechanical Valve (N/A) TRANSESOPHAGEAL ECHOCARDIOGRAM (TEE) (N/A) Subjective: Some mouth pain from dental wotk, o/w pain is minimal For endoscopy this am  Objective: Vital signs in last 24 hours: Temp:  [97.6 F (36.4 C)-98.1 F (36.7 C)] 98.1 F (36.7 C) (06/12 0425) Pulse Rate:  [64-79] 66 (06/12 0425) Cardiac Rhythm: Normal sinus rhythm (06/11 1945) Resp:  [11-17] 17 (06/12 0425) BP: (104-118)/(67-76) 118/74 (06/12 0425) SpO2:  [95 %-99 %] 98 % (06/12 0425)  Hemodynamic parameters for last 24 hours:    Intake/Output from previous day: 06/11 0701 - 06/12 0700 In: 1230 [P.O.:540; IV Piggyback:690] Out: 550 [Urine:550] Intake/Output this shift: No intake/output data recorded.  General appearance: alert, cooperative and no distress Heart: regular rate and rhythm Lungs: clear to auscultation bilaterally Abdomen: soft, non-tender; bowel sounds normal; no masses,  no organomegaly Extremities: no edema Wound: incis healing well  Lab Results: Recent Labs    05/21/18 1719 05/22/18 0320  WBC 11.2* 6.5  HGB 9.0* 7.7*  HCT 27.9* 24.0*  PLT 223 216   BMET:  Recent Labs    05/21/18 0625 05/23/18 0500  NA 136 138  K 3.1* 3.4*  CL 99* 94*  CO2 31 34*  GLUCOSE 95 134*  BUN 18 17  CREATININE 1.24 1.32*  CALCIUM 8.1* 8.9    PT/INR:  Recent Labs    05/23/18 0500  LABPROT 15.9*  INR 1.28   ABG    Component Value Date/Time   PHART 7.362 05/18/2018 1746   HCO3 22.8 05/18/2018 1746   TCO2 25 05/19/2018 1731   ACIDBASEDEF 3.0 (H) 05/18/2018 1746   O2SAT 99.0 05/18/2018 1746   CBG (last 3)  No results for input(s): GLUCAP in the last 72 hours.  Meds Scheduled Meds: . acetaminophen  1,000 mg Oral Q6H   Or  . acetaminophen (TYLENOL) oral liquid 160 mg/5 mL  1,000 mg Per Tube Q6H    . bisacodyl  10 mg Oral Daily   Or  . bisacodyl  10 mg Rectal Daily  . chlorhexidine gluconate (MEDLINE KIT)  15 mL Mouth Rinse BID  . Chlorhexidine Gluconate Cloth  6 each Topical Daily  . docusate sodium  200 mg Oral Daily  . ferrous [id number] C-folic acid  1 capsule Oral TID PC  . furosemide  40 mg Intravenous Daily  . magic mouthwash w/lidocaine  5 mL Oral QID  . mouth rinse  15 mL Mouth Rinse BID  . methadone  100 mg Oral Daily  . metolazone  5 mg Oral Daily  . metoprolol tartrate  25 mg Oral BID  . nicotine  21 mg Transdermal Daily  . pantoprazole  40 mg Oral Q0600  . sodium chloride flush  3 mL Intravenous Q12H  . Warfarin - Physician Dosing Inpatient   Does not apply q1800   Continuous Infusions: . sodium chloride    . sodium chloride    . ampicillin (OMNIPEN) IV Stopped (05/23/18 7125)  . gentamicin 60 mg (05/23/18 2712)  . lactated ringers 20 mL/hr at 05/20/18 0504   PRN Meds:.sodium chloride, lactulose, magnesium hydroxide, metoprolol tartrate, midazolam, ondansetron (ZOFRAN) IV, oxyCODONE, sodium chloride flush, traMADol, zolpidem  Xrays No results found.  Assessment/Plan: S/P Procedure(s) (LRB): AORTIC VALVE  REPLACEMENT (AVR) using On-X Size 21 Mechanical Valve (N/A) TRANSESOPHAGEAL ECHOCARDIOGRAM (TEE) (N/A)   1 doing well 2 H/H pending- endoscopy this am 3 Creat up a little, toradol stopped yesterday 4 K+ 3.4 - will replace 5 INR 1.28- poss restart coumadin today pending results of endoscopy 6 cont routine rehab/pulm toilet 7 ABX management as per ID  LOS: 23 days    Jeffery Bennett 05/23/2018    Impression:               - Esophagogastric landmarks identified.                           - Normal esophagus                           - Normal stomach.                           - Nonspecific mildly scalloped mucosa was found in                            the duodenum. Biopsied.                           - Normal duodenum otherwise                            - The examined portion of the jejunum was normal.                           No clear pathology for anemia on this exam.                            Colonoscopy is recommended  Continue low dose coumadin  hgb 7.7  Follow up lab in am I have seen and examined [name] and agree with the above assessment  and plan.  Grace Isaac MD Beeper [phone] Office [phone] 05/23/2018 6:10 PM

## 2018-05-23 NOTE — Op Note (Signed)
[facility] Name: Jeffery BayleyBrian Bennett Procedure Date : 05/23/2018 MRN: [id number] Attending MD: Willaim RayasSteven P. Adela LankArmbruster , MD Date of Birth: November 14, 1981 CSN: [id number] Age: [age] Admit Type: Inpatient Procedure:                Small bowel enteroscopy Indications:              Anemia on coumadin, heme (+) stool Providers:                Willaim RayasSteven P. Adela LankArmbruster, MD, Jacquiline DoeJennifer Zhu, RN, Zoila ShutterGary                            Bryant, Technician Referring MD:              Medicines:                Monitored Anesthesia Care Complications:            No immediate complications. Estimated blood loss:                            Minimal. Estimated Blood Loss:     Estimated blood loss was minimal. Procedure:                Pre-Anesthesia Assessment:                           - Prior to the procedure, a History and Physical                            was performed, and patient medications and                            allergies were reviewed. The patient's tolerance of                            previous anesthesia was also reviewed. The risks                            and benefits of the procedure and the sedation                            options and risks were discussed with the patient.                            All questions were answered, and informed consent                            was obtained. Prior Anticoagulants: The patient has                            taken Coumadin (warfarin), last dose was 1 day                            prior to procedure. ASA Grade Assessment: III - A  patient with severe systemic disease. After                            reviewing the risks and benefits, the patient was                            deemed in satisfactory condition to undergo the                            procedure.                           After obtaining informed consent, the endoscope was                            passed under direct vision. Throughout the                             procedure, the patient's blood pressure, pulse, and                            oxygen saturations were monitored continuously. The                            EC-[id number] ([id number]) scope was introduced through                            the mouth and advanced to the proximal jejunum. The                            small bowel enteroscopy was accomplished without                            difficulty. The patient tolerated the procedure                            well. Scope In: Scope Out: Findings:      Esophagogastric landmarks were identified: the Z-line was found at 42       cm, the gastroesophageal junction was found at 42 cm and the upper       extent of the gastric folds was found at 42 cm from the incisors.      The exam of the esophagus was otherwise normal.      The entire examined stomach was normal.      Mildly scalloped mucosa was found in the duodenal bulb and in the second       portion of the duodenum. Biopsies for histology were taken with a cold       forceps for evaluation of celiac disease.      The exam of the duodenum was otherwise normal.      There was no evidence of significant pathology in what was suspected to       be the extent reached, proximal jejunum. Impression:               - Esophagogastric landmarks identified.                           -  Normal esophagus                           - Normal stomach.                           - Nonspecific mildly scalloped mucosa was found in                            the duodenum. Biopsied.                           - Normal duodenum otherwise                           - The examined portion of the jejunum was normal.                           No clear pathology for anemia on this exam.                            Colonoscopy is recommended Recommendation:           - Return patient to hospital ward for ongoing care.                           - Clear liquid diet.                           -  Await pathology results.                           - Bowel prep this evening                           - Recommend colonoscopy tomorrow if patient is                            willing Procedure Code(s):        --- Professional ---                           [phone], Small intestinal endoscopy, enteroscopy                            beyond second portion of duodenum, not including                            ileum; with biopsy, single or multiple Diagnosis Code(s):        --- Professional ---                           K31.89, Other diseases of stomach and duodenum                           D64.9, Anemia, unspecified CPT copyright 2017 American Medical Association. All rights reserved. The codes documented in this report are preliminary and upon  coder review may  be revised to meet current compliance requirements. Viviann Spare P. Mckynleigh Mussell, MD 05/23/2018 10:49:27 AM This report has been signed electronically. Number of Addenda: 0

## 2018-05-23 NOTE — Anesthesia Preprocedure Evaluation (Addendum)
Anesthesia Evaluation    History of Anesthesia Complications Negative for: history of anesthetic complications  Airway Mallampati: II  TM Distance: >3 FB Neck ROM: Full    Dental  (+) Dental Advisory Given   Pulmonary Current Smoker,    breath sounds clear to auscultation       Cardiovascular + Valvular Problems/Murmurs (s/p AVR for enterococcal endocarditis)  Rhythm:Regular Rate:Normal  05/18/18 ECHO: Normal cavity size and wall thickness. LV systolic function is normal, EF 55-60%, no obvious wall motion abnormalities, mild-mod MR   Neuro/Psych negative neurological ROS     GI/Hepatic negative GI ROS, (+)     substance abuse (no on methadone)  IV drug use, Hepatitis -, C  Endo/Other  negative endocrine ROS  Renal/GU negative Renal ROS     Musculoskeletal   Abdominal   Peds  Hematology  (+) Blood dyscrasia (Hb 7.7), anemia , Coumadin: INR 1.28   Anesthesia Other Findings   Reproductive/Obstetrics                            Anesthesia Physical Anesthesia Plan  ASA: III  Anesthesia Plan: MAC   Post-op Pain Management:    Induction: Intravenous  PONV Risk Score and Plan: 0 and Treatment may vary due to age or medical condition  Airway Management Planned: Natural Airway and Nasal Cannula  Additional Equipment:   Intra-op Plan:   Post-operative Plan:   Informed Consent: I have reviewed the patients History and Physical, chart, labs and discussed the procedure including the risks, benefits and alternatives for the proposed anesthesia with the patient or authorized representative who has indicated his/her understanding and acceptance.   Dental advisory given  Plan Discussed with: CRNA and Surgeon  Anesthesia Plan Comments: (Plan routine monitors, MAC)        Anesthesia Quick Evaluation

## 2018-05-23 NOTE — Progress Notes (Signed)
Pt refused walking right now, sts he is in a bad mood. sts he will walk later, that he has been walking 5-6x a day. Denied any needs. Will f/u tomorrow. Ethelda ChickKristan Keandria Berrocal CES, ACSM 1:25 PM 05/23/2018

## 2018-05-23 NOTE — Anesthesia Procedure Notes (Signed)
Procedure Name: MAC Date/Time: 05/23/2018 10:19 AM Performed by: Teressa Lower., CRNA Pre-anesthesia Checklist: Patient identified, Emergency Drugs available, Suction available, Patient being monitored and Timeout performed Patient Re-evaluated:Patient Re-evaluated prior to induction Oxygen Delivery Method: Nasal cannula

## 2018-05-23 NOTE — Interval H&P Note (Signed)
History and Physical Interval Note:  05/23/2018 9:46 AM  Jeffery Bennett  has presented today for surgery, with the diagnosis of anemia  The various methods of treatment have been discussed with the patient and family. After consideration of risks, benefits and other options for treatment, the patient has consented to  Procedure(s): ENTEROSCOPY (N/A) as a surgical intervention .  The patient's history has been reviewed, patient examined, no change in status, stable for surgery.  I have reviewed the patient's chart and labs.  Questions were answered to the patient's satisfaction.     Viviann SpareSteven P Sarh Kirschenbaum

## 2018-05-23 NOTE — Progress Notes (Signed)
Patient refusing to undergo colonoscopy that is scheduled for 05/24/18.  Patient states he would be willing to have colonoscopy at a later date.  PA notified.

## 2018-05-23 NOTE — Anesthesia Postprocedure Evaluation (Signed)
Anesthesia Post Note  Patient: Jeffery Bennett  Procedure(s) Performed: ENTEROSCOPY (N/A ) BIOPSY     Patient location during evaluation: Endoscopy Anesthesia Type: MAC Level of consciousness: awake and alert, oriented and patient cooperative Pain management: pain level controlled Vital Signs Assessment: post-procedure vital signs reviewed and stable Respiratory status: spontaneous breathing, nonlabored ventilation and respiratory function stable Cardiovascular status: blood pressure returned to baseline and stable Postop Assessment: no apparent nausea or vomiting Anesthetic complications: no    Last Vitals:  Vitals:   05/23/18 1157 05/23/18 1218  BP: 123/79 121/81  Pulse:  61  Resp:  10  Temp:  36.5 C  SpO2:  98%    Last Pain:  Vitals:   05/23/18 1251  TempSrc:   PainSc: 0-No pain                 Zuleica Seith,E. Lucifer Soja

## 2018-05-23 NOTE — Transfer of Care (Signed)
Immediate Anesthesia Transfer of Care Note  Patient: Jeffery Bennett  Procedure(s) Performed: ENTEROSCOPY (N/A ) BIOPSY  Patient Location: Endoscopy Unit  Anesthesia Type:MAC  Level of Consciousness: awake, alert  and oriented  Airway & Oxygen Therapy: Patient Spontanous Breathing  Post-op Assessment: Report given to RN and Post -op Vital signs reviewed and stable  Post vital signs: Reviewed and stable  Last Vitals:  Vitals Value Taken Time  BP    Temp    Pulse    Resp    SpO2      Last Pain:  Vitals:   05/23/18 0917  TempSrc: Oral  PainSc: 0-No pain      Patients Stated Pain Goal: 3 (26/83/41 9622)  Complications: No apparent anesthesia complications

## 2018-05-24 ENCOUNTER — Encounter (HOSPITAL_COMMUNITY): Payer: Self-pay | Admitting: Certified Registered"

## 2018-05-24 DIAGNOSIS — D649 Anemia, unspecified: Secondary | ICD-10-CM

## 2018-05-24 LAB — CBC
HCT: 27.7 % — ABNORMAL LOW (ref 39.0–52.0)
Hemoglobin: 8.8 g/dL — ABNORMAL LOW (ref 13.0–17.0)
MCH: 26.2 pg (ref 26.0–34.0)
MCHC: 31.8 g/dL (ref 30.0–36.0)
MCV: 82.4 fL (ref 78.0–100.0)
Platelets: 332 K/uL (ref 150–400)
RBC: 3.36 MIL/uL — ABNORMAL LOW (ref 4.22–5.81)
RDW: 14.6 % (ref 11.5–15.5)
WBC: 7.8 K/uL (ref 4.0–10.5)

## 2018-05-24 LAB — COMPREHENSIVE METABOLIC PANEL WITH GFR
ALT: 15 U/L — ABNORMAL LOW (ref 17–63)
AST: 13 U/L — ABNORMAL LOW (ref 15–41)
Albumin: 2.8 g/dL — ABNORMAL LOW (ref 3.5–5.0)
Alkaline Phosphatase: 67 U/L (ref 38–126)
Anion gap: 10 (ref 5–15)
BUN: 20 mg/dL (ref 6–20)
CO2: 34 mmol/L — ABNORMAL HIGH (ref 22–32)
Calcium: 8.7 mg/dL — ABNORMAL LOW (ref 8.9–10.3)
Chloride: 94 mmol/L — ABNORMAL LOW (ref 101–111)
Creatinine, Ser: 1.42 mg/dL — ABNORMAL HIGH (ref 0.61–1.24)
GFR calc Af Amer: >60 mL/min
GFR calc non Af Amer: >60 mL/min
Glucose, Bld: 106 mg/dL — ABNORMAL HIGH (ref 65–99)
Potassium: 3.5 mmol/L (ref 3.5–5.1)
Sodium: 138 mmol/L (ref 135–145)
Total Bilirubin: 0.3 mg/dL (ref 0.3–1.2)
Total Protein: 6.8 g/dL (ref 6.5–8.1)

## 2018-05-24 LAB — PROTIME-INR
INR: 1.26
Prothrombin Time: 15.7 s — ABNORMAL HIGH (ref 11.4–15.2)

## 2018-05-24 LAB — GENTAMICIN LEVEL, TROUGH: Gentamicin Trough: 0.7 ug/mL (ref 0.5–2.0)

## 2018-05-24 MED ORDER — PEG-KCL-NACL-NASULF-NA ASC-C 100 G PO SOLR: 1.0000 | Freq: Once | ORAL | Status: DC

## 2018-05-24 MED ORDER — PEG-KCL-NACL-NASULF-NA ASC-C 100 G PO SOLR
0.5000 | Freq: Once | ORAL | Status: AC
  Administered 2018-05-24: 100 g via ORAL
  Filled 2018-05-24: qty 1

## 2018-05-24 MED ORDER — PEG-KCL-NACL-NASULF-NA ASC-C 100 G PO SOLR
0.5000 | Freq: Once | ORAL | Status: AC
  Administered 2018-05-25: 100 g via ORAL
  Filled 2018-05-24: qty 1

## 2018-05-24 MED ORDER — WARFARIN SODIUM 2.5 MG PO TABS
2.5000 mg | ORAL_TABLET | Freq: Once | ORAL | Status: AC
  Administered 2018-05-24: 2.5 mg via ORAL
  Filled 2018-05-24: qty 1

## 2018-05-24 NOTE — Progress Notes (Signed)
Daily Rounding Note  05/24/2018, 9:38 AM  LOS: 24 days   SUBJECTIVE:   Chief complaint:     Refused to stay on clears, wanted solid food because he was hungry.  Refused to drink prep, wanted to wait a day for the colonoscopy.  Consents to getting colonoscopy tomorrow.  Started clears this AM.      OBJECTIVE:         Vital signs in last 24 hours:    Temp:  [97.6 F (36.4 C)-98.3 F (36.8 C)] 98.3 F (36.8 C) (06/13 0530) Pulse Rate:  [61-67] 66 (06/13 0530) Resp:  [10-24] 18 (06/13 0530) BP: (81-123)/(51-81) 110/70 (06/13 0530) SpO2:  [97 %-99 %] 97 % (06/13 0530) Weight:  [175 lb (79.4 kg)] 175 lb (79.4 kg) (06/13 0500) Last BM Date: 05/23/18 Filed Weights   05/22/18 0605 05/23/18 0917 05/24/18 0500  Weight: 177 lb 4.8 oz (80.4 kg) 177 lb (80.3 kg) 175 lb (79.4 kg)   General: NAD   Spoke with pt, did not reexamine him  Intake/Output from previous day: 06/12 0701 - 06/13 0700 In: 3995 [P.O.:600; I.V.:300; IV Piggyback:3095] Out: -   Intake/Output this shift: No intake/output data recorded.  Lab Results: Recent Labs    05/22/18 0320 05/23/18 0854 05/24/18 0528  WBC 6.5 7.8 7.8  HGB 7.7* 8.4* 8.8*  HCT 24.0* 26.1* 27.7*  PLT 216 295 332   BMET Recent Labs    05/23/18 0500 05/24/18 0528  NA 138 138  K 3.4* 3.5  CL 94* 94*  CO2 34* 34*  GLUCOSE 134* 106*  BUN 17 20  CREATININE 1.32* 1.42*  CALCIUM 8.9 8.7*   LFT Recent Labs    05/24/18 0528  PROT 6.8  ALBUMIN 2.8*  AST 13*  ALT 15*  ALKPHOS 67  BILITOT 0.3   PT/INR Recent Labs    05/23/18 0500 05/24/18 0528  LABPROT 15.9* 15.7*  INR 1.28 1.26   Scheduled Meds: . bisacodyl  10 mg Oral Daily   Or  . bisacodyl  10 mg Rectal Daily  . chlorhexidine gluconate (MEDLINE KIT)  15 mL Mouth Rinse BID  . Chlorhexidine Gluconate Cloth  6 each Topical Daily  . docusate sodium  200 mg Oral Daily  . ferrous [id number] C-folic acid   1 capsule Oral TID PC  . furosemide  40 mg Intravenous Daily  . magic mouthwash w/lidocaine  5 mL Oral QID  . mouth rinse  15 mL Mouth Rinse BID  . methadone  100 mg Oral Daily  . metolazone  5 mg Oral Daily  . metoprolol tartrate  25 mg Oral BID  . nicotine  21 mg Transdermal Daily  . pantoprazole  40 mg Oral Q0600  . peg 3350 powder  1 kit Oral Once  . sodium chloride flush  3 mL Intravenous Q12H  . warfarin  2.5 mg Oral ONCE-1800  . Warfarin - Physician Dosing Inpatient   Does not apply q1800   Continuous Infusions: . sodium chloride Stopped (05/23/18 1042)  . ampicillin (OMNIPEN) IV 2 g (05/24/18 0645)  . gentamicin Stopped (05/24/18 0622)  . lactated ringers Stopped (05/23/18 2300)   PRN Meds:.sodium chloride, lactulose, magnesium hydroxide, metoprolol tartrate, midazolam, ondansetron (ZOFRAN) IV, oxyCODONE, sodium chloride flush, traMADol, zolpidem   ASSESMENT:   *   Acute, postop on top of chronic disease anemia.  Normocytic, normochromic.   Started on oral iron a few days ago. Scant, constipation associate BPR  a few months ago.   Started on TID Trinsicon Entersoscopy 6/12: scalloped but non-bleeding duodenal mucosa.  Otherwise normal study.     *    Hepatitis C, untreated.  LFTs normal at admission.  Do not see any past/recent imaging of the liver, abdomen.    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  Requires "low-dose" Coumadin.  INR subtherapeutic, Coumadin on hold. Continues ongoing antibiotics.  *   Recent dental extractions prior to valve replacement.  No untoward oral bleeding as a result of this and on exam extraction sites benign.   PLAN   *  Colonoscopy set for 0945 tmrw.  Prep orders reentered.      Azucena Freed  05/24/2018, 9:38 AM Phone [phone]

## 2018-05-24 NOTE — Progress Notes (Addendum)
[name]       [name]             [phone]      1 Day Post-Op Procedure(s) (LRB): ENTEROSCOPY (N/A) BIOPSY Subjective: Refusing colonoscopy because he is hungry, will agree to at another time  Objective: Vital signs in last 24 hours: Temp:  [97.6 F (36.4 C)-98.3 F (36.8 C)] 98.3 F (36.8 C) (06/13 0530) Pulse Rate:  [61-79] 66 (06/13 0530) Cardiac Rhythm: Normal sinus rhythm (06/13 0700) Resp:  [10-24] 18 (06/13 0530) BP: (81-127)/(51-81) 110/70 (06/13 0530) SpO2:  [97 %-99 %] 97 % (06/13 0530) Weight:  [79.4 kg (175 lb)-80.3 kg (177 lb)] 79.4 kg (175 lb) (06/13 0500)  Hemodynamic parameters for last 24 hours:    Intake/Output from previous day: 06/12 0701 - 06/13 0700 In: 3995 [P.O.:600; I.V.:300; IV Piggyback:3095] Out: -  Intake/Output this shift: No intake/output data recorded.  General appearance: alert, cooperative and no distress Heart: regular rate and rhythm Lungs: clear to auscultation bilaterally Abdomen: soft, non-tender; bowel sounds normal; no masses,  no organomegaly Extremities: extremities normal, atraumatic, no cyanosis or edema Wound: incis healing well  Lab Results: Recent Labs    05/23/18 0854 05/24/18 0528  WBC 7.8 7.8  HGB 8.4* 8.8*  HCT 26.1* 27.7*  PLT 295 332   BMET:  Recent Labs    05/23/18 0500 05/24/18 0528  NA 138 138  K 3.4* 3.5  CL 94* 94*  CO2 34* 34*  GLUCOSE 134* 106*  BUN 17 20  CREATININE 1.32* 1.42*  CALCIUM 8.9 8.7*    PT/INR:  Recent Labs    05/24/18 0528  LABPROT 15.7*  INR 1.26   ABG    Component Value Date/Time   PHART 7.362 05/18/2018 1746   HCO3 22.8 05/18/2018 1746   TCO2 25 05/19/2018 1731   ACIDBASEDEF 3.0 (H) 05/18/2018 1746   O2SAT 99.0 05/18/2018 1746   CBG (last 3)  No results for input(s): GLUCAP in the last 72 hours.  Meds Scheduled Meds: . bisacodyl  10 mg Oral Daily   Or  . bisacodyl  10 mg Rectal Daily  . chlorhexidine  gluconate (MEDLINE KIT)  15 mL Mouth Rinse BID  . Chlorhexidine Gluconate Cloth  6 each Topical Daily  . docusate sodium  200 mg Oral Daily  . ferrous [id number] C-folic acid  1 capsule Oral TID PC  . furosemide  40 mg Intravenous Daily  . magic mouthwash w/lidocaine  5 mL Oral QID  . mouth rinse  15 mL Mouth Rinse BID  . methadone  100 mg Oral Daily  . metolazone  5 mg Oral Daily  . metoprolol tartrate  25 mg Oral BID  . nicotine  21 mg Transdermal Daily  . pantoprazole  40 mg Oral Q0600  . peg 3350 powder  1 kit Oral Once  . sodium chloride flush  3 mL Intravenous Q12H  . Warfarin - Physician Dosing Inpatient   Does not apply q1800   Continuous Infusions: . sodium chloride Stopped (05/23/18 1042)  . ampicillin (OMNIPEN) IV 2 g (05/24/18 0645)  . gentamicin Stopped (05/24/18 0622)  . lactated ringers Stopped (05/23/18 2300)   PRN Meds:.sodium chloride, lactulose, magnesium hydroxide, metoprolol tartrate, midazolam, ondansetron (ZOFRAN) IV, oxyCODONE, sodium chloride flush, traMADol, zolpidem  Xrays No results found.  Assessment/Plan: S/P Procedure(s) (LRB): ENTEROSCOPY (N/A) BIOPSY   1 stable overall, hemodyn stable in sinus rhythm 2 H/H is stable, he is  refusing colonoscopy for today and did not do prep so will need to be done at a later time 3 will give low dose coumadin today with INR 1.2 and mechanical valve 4 will ask SW to see to assist with disposition 5 abx per ID recs 6 creat up a little further, will d/c lasix. Off toradol, replace K+    LOS: 24 days    Jeffery Bennett 05/24/2018  hgb stable Back on coumadin yesterday and today For colonoscopy Bili  Not elevated, doubt hemolysis  I have seen and examined Jeffery Bennett and agree with the above assessment  and plan.  Grace Isaac MD Beeper [phone] Office [phone] 05/24/2018 10:23 AM

## 2018-05-24 NOTE — H&P (View-Only) (Signed)
Daily Rounding Note  05/24/2018, 9:38 AM  LOS: 24 days   SUBJECTIVE:   Chief complaint:     Refused to stay on clears, wanted solid food because he was hungry.  Refused to drink prep, wanted to wait a day for the colonoscopy.  Consents to getting colonoscopy tomorrow.  Started clears this AM.      OBJECTIVE:         Vital signs in last 24 hours:    Temp:  [97.6 F (36.4 C)-98.3 F (36.8 C)] 98.3 F (36.8 C) (06/13 0530) Pulse Rate:  [61-67] 66 (06/13 0530) Resp:  [10-24] 18 (06/13 0530) BP: (81-123)/(51-81) 110/70 (06/13 0530) SpO2:  [97 %-99 %] 97 % (06/13 0530) Weight:  [175 lb (79.4 kg)] 175 lb (79.4 kg) (06/13 0500) Last BM Date: 05/23/18 Filed Weights   05/22/18 0605 05/23/18 0917 05/24/18 0500  Weight: 177 lb 4.8 oz (80.4 kg) 177 lb (80.3 kg) 175 lb (79.4 kg)   General: NAD   Spoke with pt, did not reexamine him  Intake/Output from previous day: 06/12 0701 - 06/13 0700 In: 3995 [P.O.:600; I.V.:300; IV Piggyback:3095] Out: -   Intake/Output this shift: No intake/output data recorded.  Lab Results: Recent Labs    05/22/18 0320 05/23/18 0854 05/24/18 0528  WBC 6.5 7.8 7.8  HGB 7.7* 8.4* 8.8*  HCT 24.0* 26.1* 27.7*  PLT 216 295 332   BMET Recent Labs    05/23/18 0500 05/24/18 0528  NA 138 138  K 3.4* 3.5  CL 94* 94*  CO2 34* 34*  GLUCOSE 134* 106*  BUN 17 20  CREATININE 1.32* 1.42*  CALCIUM 8.9 8.7*   LFT Recent Labs    05/24/18 0528  PROT 6.8  ALBUMIN 2.8*  AST 13*  ALT 15*  ALKPHOS 67  BILITOT 0.3   PT/INR Recent Labs    05/23/18 0500 05/24/18 0528  LABPROT 15.9* 15.7*  INR 1.28 1.26   Scheduled Meds: . bisacodyl  10 mg Oral Daily   Or  . bisacodyl  10 mg Rectal Daily  . chlorhexidine gluconate (MEDLINE KIT)  15 mL Mouth Rinse BID  . Chlorhexidine Gluconate Cloth  6 each Topical Daily  . docusate sodium  200 mg Oral Daily  . ferrous [id number] C-folic acid   1 capsule Oral TID PC  . furosemide  40 mg Intravenous Daily  . magic mouthwash w/lidocaine  5 mL Oral QID  . mouth rinse  15 mL Mouth Rinse BID  . methadone  100 mg Oral Daily  . metolazone  5 mg Oral Daily  . metoprolol tartrate  25 mg Oral BID  . nicotine  21 mg Transdermal Daily  . pantoprazole  40 mg Oral Q0600  . peg 3350 powder  1 kit Oral Once  . sodium chloride flush  3 mL Intravenous Q12H  . warfarin  2.5 mg Oral ONCE-1800  . Warfarin - Physician Dosing Inpatient   Does not apply q1800   Continuous Infusions: . sodium chloride Stopped (05/23/18 1042)  . ampicillin (OMNIPEN) IV 2 g (05/24/18 0645)  . gentamicin Stopped (05/24/18 0622)  . lactated ringers Stopped (05/23/18 2300)   PRN Meds:.sodium chloride, lactulose, magnesium hydroxide, metoprolol tartrate, midazolam, ondansetron (ZOFRAN) IV, oxyCODONE, sodium chloride flush, traMADol, zolpidem   ASSESMENT:   *   Acute, postop on top of chronic disease anemia.  Normocytic, normochromic.   Started on oral iron a few days ago. Scant, constipation associate BPR  a few months ago.   Started on TID Trinsicon Entersoscopy 6/12: scalloped but non-bleeding duodenal mucosa.  Otherwise normal study.     *    Hepatitis C, untreated.  LFTs normal at admission.  Do not see any past/recent imaging of the liver, abdomen.    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  Requires "low-dose" Coumadin.  INR subtherapeutic, Coumadin on hold. Continues ongoing antibiotics.  *   Recent dental extractions prior to valve replacement.  No untoward oral bleeding as a result of this and on exam extraction sites benign.   PLAN   *  Colonoscopy set for 0945 tmrw.  Prep orders reentered.      Azucena Freed  05/24/2018, 9:38 AM Phone [phone]

## 2018-05-24 NOTE — Progress Notes (Signed)
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 Repeat blood cultures from 5/21 negative.  Tissue culture from OR 6/7 pending, but rare Coag negative Staph on report. No anaerobes.   POD # 6 valve replacement. Planning antibiotics at least 4 weeks post-op (06/15/18) but prefer 6 weeks (06/29/18).  Gentamicin trough trended up from 0.6 mcg/ml on 6/3 to 1.3 on 6/10 on Gentamicin 100 mg IV q12hrs and dose reduced to 60 mg IV q12hrs on 6/10 Gent trough level today is < 0.7, at goal. Creatinine has continued to trended up.  Noted plan to stop Lasix. Also on Metolazone.  Plan:  Continue Gentamicin to 60 mg IV q12h for synergy dosing  Continue Ampicillin 2gm IV q4h  Watch renal function closely, bmet at least twice a week.  Plan for at least weekly gent trough, with goal <1 for synergy.  Stop Lasix and metolazone?  Both given this am.  Height: 5\' 9"  (175.3 cm) Weight: 175 lb (79.4 kg) IBW/kg (Calculated) : 70.7  Temp (24hrs), Avg:98 F (36.7 C), Min:97.7 F (36.5 C), Max:98.3 F (36.8 C)  Recent Labs  Lab 05/19/18 1731 05/20/18 0445 05/21/18 0625 05/21/18 1719 05/22/18 0320 05/23/18 0500 05/23/18 0854 05/24/18 0528  WBC  --  8.8 9.1 11.2* 6.5  --  7.8 7.8  CREATININE 0.80 1.01 1.24  --   --  1.32*  --  1.42*  GENTTROUGH  --   --  1.3  --   --   --   --  0.7    Estimated Creatinine Clearance: 71.9 mL/min (A) (by C-G formula based on SCr of 1.42 mg/dL (H)).    No Known Allergies   Antibiotics this admission:  Vancomycin 5/20 x1  Ampicillin 5/20>>  Gentamicin 5/20>>  Levels/dose adjustments: 5/22 gent trough 0.5 mcg/ml - continue 100 mg IV q12hrs 5/28 gent trough 0.6 mcg/ml 5/31 gent trough 1.2 mcg/ml (drawn 9hrs post dose, true trough likely ~0.7) 6/3  gent trough 0.6 mc/gml 6/10 gent trough 1.3 mcg/ml > decr to 60 mg q12h 6/13 gent trough 0.7 mcg/ml  Culture  data: 5/19 Blood x 2: Enterococcus > pan sensitive 5/19 BCID: Enterococcus, no resistance 5/21 blood x 2 - negative 6/7 tissue (aortic valve) - no organisms on gram stain, but now showing rare CoNS. No anaerobes. Reincubated  Jeffery Bennett, Jeffery Bennett, [name] Phone: [phone] 05/24/2018 11:21 AM

## 2018-05-25 ENCOUNTER — Encounter (HOSPITAL_COMMUNITY)
Admission: EM | Discharge status: Against Medical Advice | Payer: Self-pay | Source: Home / Self Care | Attending: Surgery

## 2018-05-25 ENCOUNTER — Encounter (HOSPITAL_COMMUNITY): Payer: Self-pay | Admitting: Gastroenterology

## 2018-05-25 ENCOUNTER — Inpatient Hospital Stay (HOSPITAL_COMMUNITY): Payer: Self-pay | Admitting: Certified Registered"

## 2018-05-25 DIAGNOSIS — R195 Other fecal abnormalities: Secondary | ICD-10-CM

## 2018-05-25 DIAGNOSIS — B957 Other staphylococcus as the cause of diseases classified elsewhere: Secondary | ICD-10-CM

## 2018-05-25 DIAGNOSIS — Z538 Procedure and treatment not carried out for other reasons: Secondary | ICD-10-CM

## 2018-05-25 LAB — BPAM RBC
Blood Product Expiration Date: 201906192359
Blood Product Expiration Date: 201906192359
ISSUE DATE / TIME: 201906070833
ISSUE DATE / TIME: 201906101253
Unit Type and Rh: 600
Unit Type and Rh: 600

## 2018-05-25 LAB — TYPE AND SCREEN
ABO/RH(D): A NEG
ANTIBODY SCREEN: NEGATIVE
UNIT DIVISION: 0
Unit division: 0

## 2018-05-25 LAB — PROTIME-INR
INR: 1.38
Prothrombin Time: 16.8 seconds — ABNORMAL HIGH (ref 11.4–15.2)

## 2018-05-25 LAB — HEPATITIS C GENOTYPE

## 2018-05-25 SURGERY — INVASIVE LAB ABORTED CASE
Anesthesia: Monitor Anesthesia Care

## 2018-05-25 MED ORDER — AMPICILLIN-SULBACTAM SODIUM 3 (2-1) G IJ SOLR
3.0000 g | Freq: Four times a day (QID) | INTRAMUSCULAR | Status: DC
  Administered 2018-05-25 – 2018-05-28 (×12): 3 g via INTRAVENOUS
  Filled 2018-05-25 (×14): qty 3

## 2018-05-25 MED ORDER — PROPOFOL 500 MG/50ML IV EMUL
INTRAVENOUS | Status: DC | PRN
  Administered 2018-05-25: 150 ug/kg/min via INTRAVENOUS

## 2018-05-25 MED ORDER — PROPOFOL 10 MG/ML IV BOLUS
INTRAVENOUS | Status: DC | PRN
  Administered 2018-05-25: 50 mg via INTRAVENOUS
  Administered 2018-05-25: 100 mg via INTRAVENOUS

## 2018-05-25 MED ORDER — LACTATED RINGERS IV SOLN
INTRAVENOUS | Status: DC
  Administered 2018-05-25: 1000 mL via INTRAVENOUS

## 2018-05-25 MED ORDER — LIDOCAINE 2% (20 MG/ML) 5 ML SYRINGE
INTRAMUSCULAR | Status: DC | PRN
  Administered 2018-05-25: 100 mg via INTRAVENOUS

## 2018-05-25 MED ORDER — PHENYLEPHRINE HCL 10 MG/ML IJ SOLN
INTRAMUSCULAR | Status: DC | PRN
  Administered 2018-05-25: 120 ug via INTRAVENOUS

## 2018-05-25 MED ORDER — ASPIRIN EC 81 MG PO TBEC
81.0000 mg | DELAYED_RELEASE_TABLET | Freq: Every day | ORAL | Status: DC
  Administered 2018-05-25 – 2018-05-28 (×4): 81 mg via ORAL
  Filled 2018-05-25 (×4): qty 1

## 2018-05-25 MED ORDER — WARFARIN SODIUM 2.5 MG PO TABS
2.5000 mg | ORAL_TABLET | Freq: Once | ORAL | Status: AC
  Administered 2018-05-25: 2.5 mg via ORAL
  Filled 2018-05-25: qty 1

## 2018-05-25 SURGICAL SUPPLY — 22 items

## 2018-05-25 NOTE — Progress Notes (Addendum)
[address]       [place] [address]             [phone]          [address]       [place] [address]             [phone]      Day of Surgery Procedure(s): Aborted Colonoscopy Subjective: Frustrated with long hospitalization, colonoscopy aborted d/t poor prep  Objective: Vital signs in last 24 hours: Temp:  [98 F (36.7 C)-98.6 F (37 C)] 98.3 F (36.8 C) (06/14 0930) Pulse Rate:  [68-87] 83 (06/14 0940) Cardiac Rhythm: Normal sinus rhythm (06/14 0700) Resp:  [12-22] 13 (06/14 0940) BP: (90-128)/(68-95) 115/80 (06/14 0940) SpO2:  [98 %-100 %] 99 % (06/14 0940) Weight:  [79.4 kg (175 lb)] 79.4 kg (175 lb) (06/14 0813)  Hemodynamic parameters for last 24 hours:    Intake/Output from previous day: 06/13 0701 - 06/14 0700 In: 400 [IV Piggyback:400] Out: -  Intake/Output this shift: Total I/O In: 400 [I.V.:400] Out: -   General appearance: alert, cooperative and no distress Heart: regular rate and rhythm Lungs: clear to auscultation bilaterally Abdomen: benign Extremities: no edema or calf tenderness Wound: incis healing well  Lab Results: Recent Labs    05/23/18 0854 05/24/18 0528  WBC 7.8 7.8  HGB 8.4* 8.8*  HCT 26.1* 27.7*  PLT 295 332   BMET:  Recent Labs    05/23/18 0500 05/24/18 0528  NA 138 138  K 3.4* 3.5  CL 94* 94*  CO2 34* 34*  GLUCOSE 134* 106*  BUN 17 20  CREATININE 1.32* 1.42*  CALCIUM 8.9 8.7*    PT/INR:  Recent Labs    05/25/18 0520  LABPROT 16.8*  INR 1.38   ABG    Component Value Date/Time   PHART 7.362 05/18/2018 1746   HCO3 22.8 05/18/2018 1746   TCO2 25 05/19/2018 1731   ACIDBASEDEF 3.0 (H) 05/18/2018 1746   O2SAT 99.0 05/18/2018 1746   CBG (last 3)  No results for input(s): GLUCAP in the last 72 hours.  Meds Scheduled Meds: . bisacodyl  10 mg Oral Daily   Or  . bisacodyl  10 mg Rectal Daily  . chlorhexidine gluconate (MEDLINE KIT)  15 mL Mouth Rinse  BID  . Chlorhexidine Gluconate Cloth  6 each Topical Daily  . docusate sodium  200 mg Oral Daily  . ferrous [id number] C-folic acid  1 capsule Oral TID PC  . magic mouthwash w/lidocaine  5 mL Oral QID  . mouth rinse  15 mL Mouth Rinse BID  . methadone  100 mg Oral Daily  . metoprolol tartrate  25 mg Oral BID  . nicotine  21 mg Transdermal Daily  . pantoprazole  40 mg Oral Q0600  . sodium chloride flush  3 mL Intravenous Q12H  . Warfarin - Physician Dosing Inpatient   Does not apply q1800   Continuous Infusions: . sodium chloride Stopped (05/23/18 1042)  . ampicillin (OMNIPEN) IV Stopped (05/25/18 0714)  . gentamicin Stopped (05/25/18 9735)  . lactated ringers Stopped (05/23/18 2300)   PRN Meds:.sodium chloride, lactulose, magnesium hydroxide, metoprolol tartrate, midazolam, ondansetron (ZOFRAN) IV, oxyCODONE, sodium chloride flush, traMADol, zolpidem  Xrays No results found.  Assessment/Plan: S/P Procedure(s): Aborted Colonoscopy  1 doing well overall 2 conts current abx as recommended per ID 3 he is not willing to retry prep for colonoscopy at this time, appears  clinically stable . Will recheck CBC in am, Bili is normal. Slow with coumadin 4 d/c epw's today 5 f/u renal fxn in am  6  Currently convinced not to leave AMA but he is very frustrated  LOS: 25 days    John Giovanni 05/25/2018  Incomplete colonoscopy today hgb holding stable On coumadin , inr 1.3  Lab Results  Component Value Date   INR 1.38 05/25/2018   INR 1.26 05/24/2018   INR 1.28 05/23/2018   Discussed need with patient to safetly get anticoagulated with mechanical valve Currently on 2.5 mg  Start low dose asa  I have seen and examined [name] and agree with the above assessment  and plan.  Grace Isaac MD Beeper [phone] Office [phone] 05/25/2018 10:54 AM

## 2018-05-25 NOTE — Interval H&P Note (Signed)
History and Physical Interval Note:  05/25/2018 9:17 AM  Devoria AlbeBrian K Hopfer  has presented today for surgery, with the diagnosis of anemia  The various methods of treatment have been discussed with the patient and family. After consideration of risks, benefits and other options for treatment, the patient has consented to  Procedure(s): COLONOSCOPY WITH PROPOFOL (N/A) as a surgical intervention .  The patient's history has been reviewed, patient examined, no change in status, stable for surgery.  I have reviewed the patient's chart and labs.  Questions were answered to the patient's satisfaction.     Viviann SpareSteven P Raylen Tangonan

## 2018-05-25 NOTE — Progress Notes (Signed)
Pharmacy Antibiotic Note  Jeffery AlbeBrian K Bennett is a 37 y.o. 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 Repeat blood cultures from 5/21 negative.  Tissue culture from OR 6/7 pending, but rare Coag negative Staph on report. No anaerobes.   POD # 6 valve replacement. Planning antibiotics at least 4 weeks post-op (06/15/18) but prefer 6 weeks (06/29/18).  Gentamicin trough trended up from 0.6 mcg/ml on 6/3 to 1.3 on 6/10 on Gentamicin 100 mg IV q12hrs and dose reduced to 60 mg IV q12hrs on 6/10 Gent trough level today is < 0.7, at goal. Creatinine has continued to trended up.  Noted plan to stop Lasix. Also on Metolazone.  6/14 update: ID asked pharmacy to switch ampicillin to Unasyn for broader coverage of staph hominis.  Plan: Continue Gentamicin to 60 mg IV q12h for synergy dosing. Change to Unasyn 3g IV q 6 hrs.  Will need to watch renal function closely with rising creatinine. Plan for at least weekly gent trough, with goal <1 for synergy.  Height: 5\' 9"  (175.3 cm) Weight: 175 lb (79.4 kg) IBW/kg (Calculated) : 70.7  Temp (24hrs), Avg:98.2 F (36.8 C), Min:98 F (36.7 C), Max:98.6 F (37 C)  Recent Labs  Lab 05/19/18 1731 05/20/18 0445 05/21/18 0625 05/21/18 1719 05/22/18 0320 05/23/18 0500 05/23/18 0854 05/24/18 0528  WBC  --  8.8 9.1 11.2* 6.5  --  7.8 7.8  CREATININE 0.80 1.01 1.24  --   --  1.32*  --  1.42*  GENTTROUGH  --   --  1.3  --   --   --   --  0.7    Estimated Creatinine Clearance: 71.9 mL/min (A) (by C-G formula based on SCr of 1.42 mg/dL (H)).    No Known Allergies   Antibiotics this admission:  Vancomycin 5/20 x1  Ampicillin 5/20>>  Gentamicin 5/20>>  Levels/dose adjustments: 5/22 gent trough 0.5 mcg/ml - continue 100 mg IV q12hrs 5/28 gent trough 0.6 mcg/ml 5/31 gent trough 1.2 mcg/ml (drawn 9hrs post dose, true trough likely ~0.7) 6/3  gent trough 0.6 mc/gml 6/10 gent trough 1.3 mcg/ml >  decr to 60 mg q12h 6/13 gent trough 0.7 mcg/ml  Culture data: 5/19 Blood x 2: Enterococcus > pan sensitive 5/19 BCID: Enterococcus, no resistance 5/21 blood x 2 - negative 6/7 tissue (aortic valve) - no organisms on gram stain, but now showing rare CoNS. No anaerobes. Reincubated  Jeffery Bennett, Pharm D, BCPS, [facility] Clinical Pharmacist Pager [phone]  05/25/2018 4:43 PM

## 2018-05-25 NOTE — Progress Notes (Signed)
Patient underwent colonoscopy this AM. The prep was extremely poor, the procedure was aborted due to poor visualization. I offered him another colonoscopy tomorrow with plans for drinking more prep today, as we don't have a clear cause for his worsening anemia, heme (+) stools, in the setting for ongoing need for anticoagulation without cause noted on enteroscopy.   He is declining further colonoscopy attempt. He tells me he is not going to drink any more prep or try this again. He states he wants to leave the hospital altogether at this time. We spoke for a bit about this and his other medical problems that it is not a good idea to leave. He will go back to his ward and speak with his primary team about this.   If he is willing to try colonoscopy again and undergo more bowel prep in the future please contact us, otherwise will sign off for now.  Ileene PatrickSteven Prisha Hiley, MD [facility]

## 2018-05-25 NOTE — Progress Notes (Signed)
CARDIAC REHAB PHASE I   PRE:  Rate/Rhythm: 87 SR  MODE:  Ambulation: >2440 ft   POST:  Rate/Rhythm: 95 SR   After pt request, and written order by PA, pt ambulated greater than 246040ft independently with steady gait outside with cardiac rehab. Reviewed sternal precautions with pt as well as weight restrictions. Pt educated on importance of quitting smoking. Pt states nicotine patches have been helping with his cravings.  Pt educated on importance of need for anticoagulation with coumadin for mechanical valve. Pt verbalizes understanding of foods containing vitamin K and their affect on his blood. Encouraged pt to continue hospital stay and receive his full course of antibiotics. Pt stated on a previous admission he left, and ended up getting readmitted with this hospital stay. Pt in much better mood and appreciative of staff for letting him have some fresh air.   [phone] Reynold Boweneresa  Karandeep Resende, RN BSN 05/25/2018 2:24 PM

## 2018-05-25 NOTE — Anesthesia Preprocedure Evaluation (Addendum)
Anesthesia Evaluation    History of Anesthesia Complications Negative for: history of anesthetic complications  Airway Mallampati: II  TM Distance: >3 FB Neck ROM: Full    Dental  (+) Dental Advisory Given   Pulmonary Current Smoker,    breath sounds clear to auscultation       Cardiovascular + Valvular Problems/Murmurs (s/p AVR for enterococcal endocarditis)  Rhythm:Regular Rate:Normal  05/18/18 ECHO: Normal cavity size and wall thickness. LV systolic function is normal, EF 55-60%, no obvious wall motion abnormalities, mild-mod MR   Neuro/Psych negative neurological ROS     GI/Hepatic negative GI ROS, (+)     substance abuse (no on methadone)  IV drug use, Hepatitis -, C  Endo/Other  negative endocrine ROS  Renal/GU negative Renal ROS     Musculoskeletal   Abdominal   Peds  Hematology  (+) Blood dyscrasia (Hb 7.7), anemia ,   Anesthesia Other Findings   Reproductive/Obstetrics                             Anesthesia Physical  Anesthesia Plan  ASA: III  Anesthesia Plan: MAC   Post-op Pain Management:    Induction: Intravenous  PONV Risk Score and Plan: 0 and Treatment may vary due to age or medical condition  Airway Management Planned: Natural Airway and Nasal Cannula  Additional Equipment:   Intra-op Plan:   Post-operative Plan:   Informed Consent: I have reviewed the patients History and Physical, chart, labs and discussed the procedure including the risks, benefits and alternatives for the proposed anesthesia with the patient or authorized representative who has indicated his/her understanding and acceptance.   Dental advisory given  Plan Discussed with: CRNA, Surgeon and Anesthesiologist  Anesthesia Plan Comments: (Plan routine monitors, MAC)        Anesthesia Quick Evaluation

## 2018-05-25 NOTE — Progress Notes (Addendum)
Clinical Social Worker following patient for needs. CSW acknowledges consult for patient placement. At this time patient is unable to be placed for IV antibiotics at a facility because of the consistency patient is receiving antibiotics. Patient is also receiving Methadone and no facility is able to manage a patient on Methadone.CSW team has assessed patient for placement in the past and stated the reasoning for why patient is unable to be placed. Please refer to below note dated for 05/04/2018.  Previous note written by 67M CSW:  "CSW received consult from MD regarding patient needing SNF placement for 6 weeks of IV antibiotics. Reviewed chart and patient currently on ampicillin (administered every 4 hours IV) and gentamicin (administered every 12 hours IV). Patient does not have any insurance. CSW consulted with SW [facility] regarding patient and the ampicillin administration of every 4 hours would need to be every 12 hours for a nursing facility to consider taking patient. MD contacted, informed and will check into ampicillin being administered every 12 hours. Once this is resolved, CSW will initiate facility search.  Genelle BalVanessa Crawford, MSW, LCSW Licensed Clinical Social Worker Clinical Social Work Department [facility] [phone] "  Jeffery Bennett, MSW,  Jeffery Bennett [phone]

## 2018-05-25 NOTE — Progress Notes (Signed)
    [facility]   Reason for visit: Follow up on endocarditis  Interval History: culture on valve growing Staph hominis, sensitivities pending.   Physical Exam: Constitutional:  Vitals:   05/25/18 1215 05/25/18 1230  BP: 101/68 111/79  Pulse: 77 66  Resp: 17 16  Temp:    SpO2:  100%   patient appears in NAD  Impression: endocarditis.  Enterococcus and now Staph hominis on valve.    Plan: 1.  I will use unasyn + gent to broaden for the S hominis.   Dr. Orvan Falconerampbell will monitor sensitivities over the weekend.

## 2018-05-25 NOTE — Transfer of Care (Signed)
Immediate Anesthesia Transfer of Care Note  Patient: TREVYON SWOR  Procedure(s) Performed: Aborted Colonoscopy  Patient Location: PACU and Endoscopy Unit  Anesthesia Type:MAC  Level of Consciousness: drowsy and patient cooperative  Airway & Oxygen Therapy: Patient Spontanous Breathing and Patient connected to face mask oxygen  Post-op Assessment: Report given to RN and Post -op Vital signs reviewed and stable  Post vital signs: Reviewed and stable  Last Vitals:  Vitals Value Taken Time  BP 90/68 05/25/2018  9:30 AM  Temp    Pulse 89 05/25/2018  9:30 AM  Resp 19 05/25/2018  9:30 AM  SpO2 100 % 05/25/2018  9:30 AM  Vitals shown include unvalidated device data.  Last Pain:  Vitals:   05/25/18 0813  TempSrc: Oral  PainSc: 0-No pain      Patients Stated Pain Goal: 3 (34/35/68 6168)  Complications: No apparent anesthesia complications

## 2018-05-25 NOTE — Op Note (Signed)
[facility] Patient Name: Jeffery Bennett Procedure Date : 05/25/2018 MRN: [id number] Attending MD: Willaim Rayas. Adela Lank , MD Date of Birth: Feb 03, 1981 CSN: [id number] Age: 37 Admit Type: Inpatient Procedure:                Colonoscopy Indications:              Heme positive stool, anemia, on coumadin Providers:                Willaim Rayas. Adela Lank, MD, Bonney Leitz, Denice Bors., Technician Referring MD:              Medicines:                Monitored Anesthesia Care Complications:            No immediate complications. Estimated blood loss:                            None. Estimated Blood Loss:     Estimated blood loss: none. Procedure:                Pre-Anesthesia Assessment:                           - Prior to the procedure, a History and Physical                            was performed, and patient medications and                            allergies were reviewed. The patient's tolerance of                            previous anesthesia was also reviewed. The risks                            and benefits of the procedure and the sedation                            options and risks were discussed with the patient.                            All questions were answered, and informed consent                            was obtained. Prior Anticoagulants: The patient has                            taken Coumadin (warfarin), last dose was 1 day                            prior to procedure. ASA Grade Assessment: III - A  patient with severe systemic disease. After                            reviewing the risks and benefits, the patient was                            deemed in satisfactory condition to undergo the                            procedure.                           After obtaining informed consent, the colonoscope                            was passed under direct vision. Throughout the                         procedure, the patient's blood pressure, pulse, and                            oxygen saturations were monitored continuously. The                            EC-[id number] ([id number]) scope was introduced through                            the anus with the intention of advancing to the                            cecum. The scope was advanced to the sigmoid colon                            before the procedure was aborted. Medications were                            given. The colonoscopy was performed without                            difficulty. The patient tolerated the procedure                            well. The quality of the bowel preparation was                            poor. The rectum was photographed. Scope In: 9:19:05 AM Scope Out: 9:23:46 AM Total Procedure Duration: 0 hours 4 minutes 41 seconds  Findings:      The perianal and digital rectal examinations were normal.      A large amount of solid stool was found in the rectum, in the       recto-sigmoid colon and in the sigmoid colon, precluding visualization.       Multiple hard stool balls noted in the left colon, could not traverse       the sigmoid safely due to poor  visualization, the procedure was aborted. Impression:               - Preparation of the colon was poor, retained stool                            in the rectum, in the recto-sigmoid colon and in                            the sigmoid colon.                           - Procedure was not able to be completed Recommendation:           - Return patient to hospital ward for ongoing care.                           - Clear liquid diet.                           - Continue present medications.                           - Bowel prep today if the patient is agreeable to                            repeat colonoscopy tomorrow. Will discuss with                            patient Procedure Code(s):        --- Professional ---                            [phone], 53, Colonoscopy, flexible; diagnostic,                            including collection of specimen(s) by brushing or                            washing, when performed (separate procedure) Diagnosis Code(s):        --- Professional ---                           R19.5, Other fecal abnormalities CPT copyright 2017 American Medical Association. All rights reserved. The codes documented in this report are preliminary and upon coder review may  be revised to meet current compliance requirements. Viviann SpareSteven P. Armbruster, MD 05/25/2018 9:30:02 AM This report has been signed electronically. Number of Addenda: 0

## 2018-05-25 NOTE — Care Management Note (Signed)
Case Management Note Previous CM note completed by Yancey FlemingsKimberly R Becton, RN 05/01/2018, 4:02 PM  Patient Details  Name: Jeffery Bennett MRN: [id number] Date of Birth: 02-07-1981  Subjective/Objective:    History of systolic murmur with IV drug abuse, etoh/tobacco abuse, Hep C; Admitted for SIRS (systemic inflammatory response syndrome).  No PCP noted. Patient uninsured.        Action/Plan: Clinical Social worker/Cardiology consulted. 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  Sed rate is elevated. CT chest suspicious for septic emboli.    Prior to admission patient lived at home with significant other, and mentioned that [facility]; plans to stay in the program for further assistance with drug use.  Patient is self-pay, medicaid pending. At discharge patient plans to return to same living situation.  No PCP noted, [facility] provided to patient.  NCM will continue to follow for discharge transition needs.      Expected Discharge Date:                  Expected Discharge Plan:     In-House Referral:  Clinical Social Work  Discharge planning Services  CM Consult  Post Acute Care Choice:    Choice offered to:     DME Arranged:    DME Agency:     HH Arranged:    HH Agency:     Status of Service:  In process, will continue to follow  If discussed at Long Length of Stay Meetings, dates discussed:    Discharge Disposition:   Additional Comments:  05/25/18- 1600- Hayslee Casebolt RN,CM- pt s/p AVR 05/18/18- ID following for IV abx duration will need 4-6 weeks of tx- Pt has PICC in place- per medical director pt does not qualify for STSNF due to Methadone and will need to stay in house for the duration of his IV abx treatment. CM and CSW will  follow for transition of care needs when pt closer to discharge.   Zenda AlpersWebster, Abita SpringsKristi Hall, RN 05/25/2018, 3:55 PM [phone] 4E Transition Care Coordinator

## 2018-05-26 LAB — CBC
HCT: 26.7 % — ABNORMAL LOW (ref 39.0–52.0)
Hemoglobin: 8.7 g/dL — ABNORMAL LOW (ref 13.0–17.0)
MCH: 26.4 pg (ref 26.0–34.0)
MCHC: 32.6 g/dL (ref 30.0–36.0)
MCV: 81.2 fL (ref 78.0–100.0)
Platelets: 378 10*3/uL (ref 150–400)
RBC: 3.29 MIL/uL — ABNORMAL LOW (ref 4.22–5.81)
RDW: 14.1 % (ref 11.5–15.5)
WBC: 8.8 10*3/uL (ref 4.0–10.5)

## 2018-05-26 LAB — BASIC METABOLIC PANEL
ANION GAP: 9 (ref 5–15)
BUN: 22 mg/dL — ABNORMAL HIGH (ref 6–20)
CO2: 34 mmol/L — ABNORMAL HIGH (ref 22–32)
Calcium: 8.7 mg/dL — ABNORMAL LOW (ref 8.9–10.3)
Chloride: 93 mmol/L — ABNORMAL LOW (ref 101–111)
Creatinine, Ser: 1.25 mg/dL — ABNORMAL HIGH (ref 0.61–1.24)
GFR calc Af Amer: >60 mL/min (ref >60)
GLUCOSE: 133 mg/dL — AB (ref 65–99)
Potassium: 2.8 mmol/L — ABNORMAL LOW (ref 3.5–5.1)
SODIUM: 136 mmol/L (ref 135–145)

## 2018-05-26 LAB — PROTIME-INR
INR: 1.32
Prothrombin Time: 16.2 seconds — ABNORMAL HIGH (ref 11.4–15.2)

## 2018-05-26 MED ORDER — WARFARIN SODIUM 5 MG PO TABS
5.0000 mg | ORAL_TABLET | Freq: Once | ORAL | Status: AC
  Administered 2018-05-26: 5 mg via ORAL
  Filled 2018-05-26: qty 1

## 2018-05-26 MED ORDER — POTASSIUM CHLORIDE CRYS ER 20 MEQ PO TBCR
40.0000 meq | EXTENDED_RELEASE_TABLET | Freq: Three times a day (TID) | ORAL | Status: AC
  Administered 2018-05-26 (×3): 40 meq via ORAL
  Filled 2018-05-26 (×3): qty 2

## 2018-05-26 NOTE — Progress Notes (Signed)
1415 Cardiac Rehab I came earlier today to ambulate pt and he ask me to come back. I have now returned and he states that he has been walking in the halls multiply times today. He denies any problems.

## 2018-05-26 NOTE — Progress Notes (Signed)
1 Day Post-Op Procedure(s): Aborted Colonoscopy Subjective: No complaints this AM  Objective: Vital signs in last 24 hours: Temp:  [97.7 F (36.5 C)-98.1 F (36.7 C)] 97.7 F (36.5 C) (06/15 0540) Pulse Rate:  [66-94] 82 (06/14 2340) Cardiac Rhythm: Normal sinus rhythm (06/15 0700) Resp:  [11-32] 16 (06/15 0540) BP: (101-120)/(68-85) 104/69 (06/15 0540) SpO2:  [97 %-100 %] 97 % (06/15 0540) Weight:  [173 lb 8 oz (78.7 kg)] 173 lb 8 oz (78.7 kg) (06/15 0540)  Hemodynamic parameters for last 24 hours:    Intake/Output from previous day: 06/14 0701 - 06/15 0700 In: 650 [I.V.:400; IV Piggyback:250] Out: -  Intake/Output this shift: No intake/output data recorded.  General appearance: alert, cooperative and no distress Neurologic: intact Heart: regular rate and rhythm Lungs: clear to auscultation bilaterally Wound: clean and dry  Lab Results: Recent Labs    05/24/18 0528 05/26/18 0946  WBC 7.8 8.8  HGB 8.8* 8.7*  HCT 27.7* 26.7*  PLT 332 378   BMET:  Recent Labs    05/24/18 0528 05/26/18 0946  NA 138 136  K 3.5 2.8*  CL 94* 93*  CO2 34* 34*  GLUCOSE 106* 133*  BUN 20 22*  CREATININE 1.42* 1.25*  CALCIUM 8.7* 8.7*    PT/INR:  Recent Labs    05/26/18 0946  LABPROT 16.2*  INR 1.32   ABG    Component Value Date/Time   PHART 7.362 05/18/2018 1746   HCO3 22.8 05/18/2018 1746   TCO2 25 05/19/2018 1731   ACIDBASEDEF 3.0 (H) 05/18/2018 1746   O2SAT 99.0 05/18/2018 1746   CBG (last 3)  No results for input(s): GLUCAP in the last 72 hours.  Assessment/Plan: S/P Procedure(s): Aborted Colonoscopy -AVR for endocarditis-   INR only 1.38 will give 5 mg coumadin today   ID- cultures growing staph hominis in addition to enterococcus  On unasyn and gentamicin- antibiotics per ID  Anemia- Hgb stable  Unable to complete colonoscopy due to poor prep    LOS: 26 days    Loreli SlotSteven C Letta Cargile 05/26/2018

## 2018-05-27 LAB — AEROBIC/ANAEROBIC CULTURE (SURGICAL/DEEP WOUND)

## 2018-05-27 LAB — SUSCEPTIBILITY RESULT

## 2018-05-27 LAB — BASIC METABOLIC PANEL
ANION GAP: 9 (ref 5–15)
BUN: 16 mg/dL (ref 6–20)
CALCIUM: 8.7 mg/dL — AB (ref 8.9–10.3)
CO2: 30 mmol/L (ref 22–32)
Chloride: 96 mmol/L — ABNORMAL LOW (ref 101–111)
Creatinine, Ser: 1.14 mg/dL (ref 0.61–1.24)
GFR calc Af Amer: >60 mL/min (ref >60)
GFR calc non Af Amer: >60 mL/min (ref >60)
GLUCOSE: 146 mg/dL — AB (ref 65–99)
Potassium: 2.9 mmol/L — ABNORMAL LOW (ref 3.5–5.1)
Sodium: 135 mmol/L (ref 135–145)

## 2018-05-27 LAB — SUSCEPTIBILITY, AER + ANAEROB

## 2018-05-27 LAB — AEROBIC/ANAEROBIC CULTURE W GRAM STAIN (SURGICAL/DEEP WOUND)

## 2018-05-27 LAB — PROTIME-INR
INR: 1.2
Prothrombin Time: 15.1 seconds (ref 11.4–15.2)

## 2018-05-27 MED ORDER — WARFARIN SODIUM 5 MG PO TABS
5.0000 mg | ORAL_TABLET | Freq: Every day | ORAL | Status: DC
  Administered 2018-05-27 – 2018-05-28 (×2): 5 mg via ORAL
  Filled 2018-05-27 (×2): qty 1

## 2018-05-27 MED ORDER — POTASSIUM CHLORIDE CRYS ER 20 MEQ PO TBCR
40.0000 meq | EXTENDED_RELEASE_TABLET | Freq: Once | ORAL | Status: AC
  Administered 2018-05-27: 40 meq via ORAL
  Filled 2018-05-27: qty 2

## 2018-05-27 MED ORDER — POTASSIUM CHLORIDE CRYS ER 20 MEQ PO TBCR
40.0000 meq | EXTENDED_RELEASE_TABLET | Freq: Three times a day (TID) | ORAL | Status: AC
  Administered 2018-05-27 (×2): 40 meq via ORAL
  Filled 2018-05-27 (×2): qty 2

## 2018-05-27 NOTE — Progress Notes (Addendum)
      [address]       [place] [id number]             [phone]      2 Days Post-Op Procedure(s): Aborted Colonoscopy Subjective: No issues overnight. He states that he is bored and wants to go home.   Objective: Vital signs in last 24 hours: Temp:  [98.3 F (36.8 C)-98.7 F (37.1 C)] 98.7 F (37.1 C) (06/16 0609) Pulse Rate:  [93] 93 (06/15 2158) Cardiac Rhythm: Normal sinus rhythm (06/16 0845) Resp:  [12-22] 22 (06/16 0620) BP: (99-114)/(64-79) 99/64 (06/16 0609) SpO2:  [98 %-99 %] 98 % (06/16 0609) Weight:  [81 kg (178 lb 9.6 oz)] 81 kg (178 lb 9.6 oz) (06/16 0620)     Intake/Output from previous day: 06/15 0701 - 06/16 0700 In: 10 [I.V.:10] Out: -  Intake/Output this shift: No intake/output data recorded.  General appearance: alert, cooperative and no distress Heart: regular rate and rhythm, S1, S2 normal, no murmur, click, rub or gallop Lungs: clear to auscultation bilaterally Abdomen: soft, non-tender; bowel sounds normal; no masses,  no organomegaly Extremities: extremities normal, atraumatic, no cyanosis or edema Wound: clean and dry  Lab Results: Recent Labs    05/26/18 0946  WBC 8.8  HGB 8.7*  HCT 26.7*  PLT 378   BMET:  Recent Labs    05/26/18 0946 05/27/18 0500  NA 136 135  K 2.8* 2.9*  CL 93* 96*  CO2 34* 30  GLUCOSE 133* 146*  BUN 22* 16  CREATININE 1.25* 1.14  CALCIUM 8.7* 8.7*    PT/INR:  Recent Labs    05/27/18 0500  LABPROT 15.1  INR 1.20   ABG    Component Value Date/Time   PHART 7.362 05/18/2018 1746   HCO3 22.8 05/18/2018 1746   TCO2 25 05/19/2018 1731   ACIDBASEDEF 3.0 (H) 05/18/2018 1746   O2SAT 99.0 05/18/2018 1746   CBG (last 3)  No results for input(s): GLUCAP in the last 72 hours.  Assessment/Plan: S/P Procedure(s): Aborted Colonoscopy  1. AVR endocarditis. INR dropped to 1.20-will give another 5mg  Coumadin.  2. ID-continue antibiotic regimen until at least 7/5 per pharmacy's note.  Cultures grew staph hominis and enterococcus. 3. GI-anemia, unable to complete colonoscopy. Last H and H 8.7/26.7 4. Creatinine is 1.14 today, replace potassium with 40MEQ TID.     LOS: 27 days    Sharlene Doryessa N Conte 05/27/2018 Patient seen and examined, agree with above  Viviann SpareSteven C. Dorris FetchHendrickson, MD [facility] [phone]

## 2018-05-28 DIAGNOSIS — I330 Acute and subacute infective endocarditis: Secondary | ICD-10-CM

## 2018-05-28 DIAGNOSIS — R7881 Bacteremia: Secondary | ICD-10-CM

## 2018-05-28 DIAGNOSIS — R791 Abnormal coagulation profile: Secondary | ICD-10-CM

## 2018-05-28 DIAGNOSIS — I351 Nonrheumatic aortic (valve) insufficiency: Secondary | ICD-10-CM

## 2018-05-28 DIAGNOSIS — B952 Enterococcus as the cause of diseases classified elsewhere: Secondary | ICD-10-CM

## 2018-05-28 DIAGNOSIS — Z1611 Resistance to penicillins: Secondary | ICD-10-CM

## 2018-05-28 LAB — PROTIME-INR
INR: 1.32
Prothrombin Time: 16.2 seconds — ABNORMAL HIGH (ref 11.4–15.2)

## 2018-05-28 MED ORDER — TRAMADOL HCL 50 MG PO TABS: 50.0000 mg | ORAL_TABLET | ORAL | 0 refills | Status: DC | PRN

## 2018-05-28 MED ORDER — METOPROLOL TARTRATE 25 MG PO TABS: 25.0000 mg | ORAL_TABLET | Freq: Two times a day (BID) | ORAL | 3 refills | Status: DC

## 2018-05-28 MED ORDER — WARFARIN SODIUM 5 MG PO TABS: 5.0000 mg | ORAL_TABLET | Freq: Every day | ORAL | 3 refills | Status: DC

## 2018-05-28 MED ORDER — LINEZOLID 600 MG PO TABS: 600.0000 mg | ORAL_TABLET | Freq: Two times a day (BID) | ORAL | 1 refills | Status: DC

## 2018-05-28 MED ORDER — ASPIRIN 81 MG PO TBEC: 81.0000 mg | DELAYED_RELEASE_TABLET | Freq: Every day | ORAL | Status: AC

## 2018-05-28 MED ORDER — VANCOMYCIN HCL 10 G IV SOLR
1500.0000 mg | Freq: Two times a day (BID) | INTRAVENOUS | Status: DC
  Filled 2018-05-28 (×2): qty 1500

## 2018-05-28 NOTE — Care Management (Signed)
1536 05-28-18 Tomi BambergerBrenda Graves-Bigelow, RN,BSN [phone] CM received call from PA-in regards to medication Linezolid. Per PA pt has some social issues at home and needs to leave the Hospital. CM did ask PA to place a Social Work consult to see if they could help resolve. Initially ID PA asked to see if he could continue on IV antibiotics @ home- not a safe plan since Hx of Drug Abuse for home IV antibiotics. CM did MATCH Program- pt can utilize the [facility] for [facility]- via [facility] each Rx will cost $3.00. No further needs from CM at this time. Gala LewandowskyGraves-Bigelow, Sybella Harnish Kaye, RN,BSN [phone]

## 2018-05-28 NOTE — Progress Notes (Signed)
Nurse paged me earlier this morning that patient is going to leave AMA. Conflicting reports of wanting to smoke and him having to move out of current residence. I spoke with patient and asked that he please not leave AMA. He is willing to wait for infectious disease to see whether or not he can be transitioned to oral antibiotic. He is not therapeutic on his Coumadin yet either. Will continue to follow.

## 2018-05-28 NOTE — Progress Notes (Signed)
Instructed patient on procedure. HOB less than 45*. Pt held breath with line removal. Pressure held with no s/sx of bleeding. Instructed pt to remain in bed for 30min and report any s/sx of bleeding. Instructed to leave drsg CDI for 24 hours. Pt VU. Tomasita MorrowHeather Davelyn Gwinn, RN VAST

## 2018-05-28 NOTE — Progress Notes (Signed)
CARDIAC REHAB PHASE I   PRE:  Rate/Rhythm: 117 ST  MODE:  Ambulation: >1200 ft   POST:  Rate/Rhythm: 95 SR   Pt ambulated >127500ft independently with steady gait. Pt agitated stating he just wants to get out of the hospital. RN made aware.   [phone] Reynold Boweneresa  Sherrick Araki, RN BSN 05/28/2018 11:16 AM

## 2018-05-28 NOTE — Progress Notes (Signed)
Chest tube sutures removed and steristrips placed. Discharge instructions reviewed with patient and prescriptions given. He is picking up prescriptions tonight at [facility] [address] and has no further questions regarding his antibiotics, followup appts or future care at this time. He left the unit in stable condition.

## 2018-05-28 NOTE — Anesthesia Postprocedure Evaluation (Signed)
Anesthesia Post Note  Patient: Jeffery Bennett  Procedure(s) Performed: Aborted Colonoscopy     Patient location during evaluation: Endoscopy Anesthesia Type: MAC Level of consciousness: awake and alert Pain management: pain level controlled Vital Signs Assessment: post-procedure vital signs reviewed and stable Respiratory status: spontaneous breathing, nonlabored ventilation, respiratory function stable and patient connected to nasal cannula oxygen Cardiovascular status: stable and blood pressure returned to baseline Postop Assessment: no apparent nausea or vomiting Anesthetic complications: no    Last Vitals:  Vitals:   05/27/18 2128 05/28/18 0510  BP: 107/73 115/79  Pulse: 79 78  Resp:  15  Temp:  36.4 C  SpO2:  100%    Last Pain:  Vitals:   05/28/18 0510  TempSrc: Oral  PainSc:                  Iven Earnhart,JAMES TERRILL

## 2018-05-28 NOTE — Progress Notes (Addendum)
      [address]       [place] [id number]             [phone]        3 Days Post-Op Procedure(s): Aborted Colonoscopy  Subjective: Patient was walking around this am. He states he has to move soon or he will not have a place to live so he cannot stay until antibiotics finished.  Objective: Vital signs in last 24 hours: Temp:  [97.5 F (36.4 C)-97.6 F (36.4 C)] 97.6 F (36.4 C) (06/17 0510) Pulse Rate:  [78-83] 78 (06/17 0510) Cardiac Rhythm: Normal sinus rhythm (06/16 2000) Resp:  [14-15] 15 (06/17 0510) BP: (107-121)/(73-82) 115/79 (06/17 0510) SpO2:  [100 %] 100 % (06/17 0510) Weight:  [178 lb 12.8 oz (81.1 kg)] 178 lb 12.8 oz (81.1 kg) (06/17 0510)  Pre op weight 83.9 kg Current Weight  05/28/18 178 lb 12.8 oz (81.1 kg)       Intake/Output from previous day: 06/16 0701 - 06/17 0700 In: 490 [P.O.:480; I.V.:10] Out: 4 [Urine:3; Stool:1]   Physical Exam:  Cardiovascular: RRR, no murmur Pulmonary: Clear to auscultation bilaterally Abdomen: Soft, non tender, bowel sounds present. Extremities: No lower extremity edema. Wounds: Clean and dry.  No erythema or signs of infection.  Lab Results: CBC: Recent Labs    05/26/18 0946  WBC 8.8  HGB 8.7*  HCT 26.7*  PLT 378   BMET:  Recent Labs    05/26/18 0946 05/27/18 0500  NA 136 135  K 2.8* 2.9*  CL 93* 96*  CO2 34* 30  GLUCOSE 133* 146*  BUN 22* 16  CREATININE 1.25* 1.14  CALCIUM 8.7* 8.7*    PT/INR:  Lab Results  Component Value Date   INR 1.32 05/28/2018   INR 1.20 05/27/2018   INR 1.32 05/26/2018   ABG:  INR: Will add last result for INR, ABG once components are confirmed Will add last 4 CBG results once components are confirmed  Assessment/Plan:  1. CV - SR in the 70's. On Lopressor 25 mg bid and Coumadin. INR increased from 1.20 to 1.32. Will continue with 5 mg daily. 2.  Pulmonary - On room air. Encourage incentive spirometer. 3.  Anemia - Last H and H 8.7  and 26.7. Continue Trinsicon. Has had EGD which was negative and colonoscopy previously unable to be done secondary to poor prep. 4. ID-On Unasyn and Gentamycin for Enterococcus and Staph Hominis. Infectious disease following.  Donielle M ZimmermanPA-C 05/28/2018,9:28 AM [phone]  Chart reviewed, patient examined, agree with above. He is adamant that he is going home either by discharge or AMA so that he can move his personal belongings from his appt before they are on the street. Hopefully he is telling the truth but who knows. He has been seen by ID and will go home on Linezolid. I doubt that he would return to complete a course of IV antibiotics so I would plan on him not doing that and plan on completing course with Linezolid. He is not therapeutic on coumadin yet so will send home on 5 mg daily with INR check in a couple days. He says that he understands risk of prosthetic valve infection without completing course of IV antibiotics and the risk of valve thrombosis,stroke, and death if his INR is not therapeutic, risk of bleeding if it is supratherapeutic.

## 2018-05-28 NOTE — Progress Notes (Signed)
Spoke with patient and let him know he cannot leave the unit without a staff member due to PICC line, telemetry monitoring, etc. He states he wants to smoke a cigarette and when told he can't smoke while inpatient, he stated he needed his PICC line out and that he would need to leave. Jeffery Bennett notified and due to herself as well as Dr Laneta SimmersBartle being in surgery, Jeffery Bennett agreed he would stay until he could speak to Dr Laneta SimmersBartle this afternoon. He is resting in his room now calmly.
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
## 2018-05-28 NOTE — Plan of Care (Signed)
  Problem: Health Behavior/Discharge Planning: Goal: Ability to manage health-related needs will improve Outcome: Adequate for Discharge   Problem: Clinical Measurements: Goal: Ability to maintain clinical measurements within normal limits will improve Outcome: Adequate for Discharge Goal: Will remain free from infection Outcome: Adequate for Discharge Goal: Diagnostic test results will improve Outcome: Adequate for Discharge Goal: Respiratory complications will improve Outcome: Adequate for Discharge Goal: Cardiovascular complication will be avoided Outcome: Adequate for Discharge   Problem: Education: Goal: Ability to demonstrate proper wound care will improve Outcome: Adequate for Discharge Goal: Knowledge of disease or condition will improve Outcome: Adequate for Discharge Goal: Knowledge of the prescribed therapeutic regimen will improve Outcome: Adequate for Discharge   Problem: Activity: Goal: Risk for activity intolerance will decrease Outcome: Adequate for Discharge   Problem: Cardiac: Goal: Hemodynamic stability will improve Outcome: Adequate for Discharge   Problem: Clinical Measurements: Goal: Postoperative complications will be avoided or minimized Outcome: Adequate for Discharge   Problem: Respiratory: Goal: Respiratory status will improve Outcome: Adequate for Discharge   Problem: Skin Integrity: Goal: Wound healing without signs and symptoms of infection Outcome: Adequate for Discharge Goal: Risk for impaired skin integrity will decrease Outcome: Adequate for Discharge   Problem: Urinary Elimination: Goal: Ability to achieve and maintain adequate renal perfusion and functioning will improve Outcome: Adequate for Discharge

## 2018-05-28 NOTE — Progress Notes (Addendum)
[facility]  Date of Admission:  04/29/2018   Total days of antibiotics 28         Day 28 Ampicillin         Day 28 Gentamicin         Patient ID: Jeffery Bennett is a 37 y.o. male with  Principal Problem:   Enterococcal endocarditis of aortic valve Active Problems:   S/P AVR (aortic valve replacement)   Hepatitis C virus infection without hepatic coma   Polysubstance abuse (HCC)   Chest pain   Anemia   Pneumonitis   . aspirin EC  81 mg Oral Daily  . bisacodyl  10 mg Oral Daily   Or  . bisacodyl  10 mg Rectal Daily  . chlorhexidine gluconate (MEDLINE KIT)  15 mL Mouth Rinse BID  . Chlorhexidine Gluconate Cloth  6 each Topical Daily  . docusate sodium  200 mg Oral Daily  . ferrous [id number] C-folic acid  1 capsule Oral TID PC  . magic mouthwash w/lidocaine  5 mL Oral QID  . mouth rinse  15 mL Mouth Rinse BID  . methadone  100 mg Oral Daily  . metoprolol tartrate  25 mg Oral BID  . nicotine  21 mg Transdermal Daily  . pantoprazole  40 mg Oral Q0600  . sodium chloride flush  3 mL Intravenous Q12H  . warfarin  5 mg Oral q1800  . Warfarin - Physician Dosing Inpatient   Does not apply q1800    SUBJECTIVE: Doing well today. No more bleeding. Needs to leave regarding personal matters over his house. He needs to leave to help with   No Known Allergies  OBJECTIVE: Vitals:   05/27/18 0620 05/27/18 1955 05/27/18 2128 05/28/18 0510  BP:  121/82 107/73 115/79  Pulse:  83 79 78  Resp: (!) _0 Temp:  (!) 97.5 F (36.4 C)  97.6 F (36.4 C)  TempSrc:    Oral  SpO2:  100%  100%  Weight: 178 lb 9.6 oz (81 kg)   178 lb 12.8 oz (81.1 kg)  Height:       Body mass index is 26.4 kg/m.  Physical Exam  Constitutional: He is oriented to person, place, and time. He appears well-developed and well-nourished.  Resting comfortably in bed.   HENT:  Mouth/Throat: Oropharynx is clear and moist and mucous membranes are normal.  Normal dentition. No dental abscesses.  Eyes: Pupils are equal, round, and reactive to light. No scleral icterus.  Cardiovascular: Normal rate and regular rhythm.  Murmur (systolic 3-2/0) heard. Pulmonary/Chest: Effort normal and breath sounds normal.  Abdominal: Soft. Bowel sounds are normal. He exhibits no distension. There is no tenderness.  Lymphadenopathy:    He has no cervical adenopathy.  Neurological: He is alert and oriented to person, place, and time.  Skin: Skin is warm and dry. No rash noted.  Psychiatric: He has a normal mood and affect. Judgment normal.    Lab Results Lab Results  Component Value Date   WBC 8.8 05/26/2018   HGB 8.7 (L) 05/26/2018   HCT 26.7 (L) 05/26/2018   MCV 81.2 05/26/2018   PLT 378 05/26/2018    Lab Results  Component Value Date   CREATININE 1.14 05/27/2018   BUN 16 05/27/2018   NA 135 05/27/2018   K 2.9 (L) 05/27/2018   CL 96 (L) 05/27/2018   CO2 30 05/27/2018    Lab  Results  Component Value Date   ALT 15 (L) 05/24/2018   AST 13 (L) 05/24/2018   ALKPHOS 67 05/24/2018   BILITOT 0.3 05/24/2018    Microbiology: BCx 5/19 >> enterococcus  5/21 >> negative  Valve Tissue 6/7 >> rare coagulase negative staph    Assessment:  Jeffery Bennett is a 37 y.o. 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 He is on day 28 of dual ampicillin/gentamicin coverage for the enterococcus --> ideally needs 2 more weeks of vancomycin/gentamicin dual therapy through July 1st then 2 weeks of vancomycin monotherapy through July 15th. Unfortunately the staph hominis was just discovered growing on cardiac valve tissue. It is resistant to penicillin. This is unfortunately has not been treated up until now.   Complicating things he is unable to stay inpatient during this time due to personal/housing issues; at risk of losing his housing. Considering he went to the length of replacing his valve it would not be ideal to cut therapy  short in anyway that would risk re-infecting the prosthetic. He has been very patient and not wanting to leave AMA without antibiotic therapy. He is willing to do whatever he needs to for treating his infection if he can have this week to ensure his housing is stable and "he has a home to go home to." Considering risk of home health management on dual nephrotoxic agents, would ask him to take linezolid 600 mg BID for the week he needs to be gone and report back to hospital to complete therapy with vanc/gent. Discussed with case manager today and will help with further assistance through [facility] program.   He has been on methadone for over 3 months now and has not injected in over 6 months. Reports that the methadone is working well for cravings.  Outpatient tx of his hepatitis c infection again discusscussed. He is appreciative we can help him with this.   Plan:  1. 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 Would prefer to provide him with 30 days of medication before leaving the hospital. He will also need warfarin for anticoagulation of the prosthetic valve.   2. Medication Monitoring = platelet count OK for linezolid. INR is subtherapeutic.   3. Chronic Hepatitis C = viral load 7.8 million. Genotype 1a. Discussed outpatient treatment once he finishes treatment for his endocarditis.   4. Opioid Use Disorder = previously long term heroin user. Well controlled on methadone. Encouraged to     Janene Madeira, MSN, NP-C [facility] [place] Cell: [phone] Pager: [phone]  05/28/18  2:22 PM

## 2018-05-28 NOTE — Progress Notes (Addendum)
Pharmacy Antibiotic Note  Jeffery Bennett is a 37 y.o. male with Enterococcal endocarditis  Repeat blood cultures from 5/21 negative.  Tissue culture from OR 6/7 with Staph resistant to oxacillin. No anaerobes.   S/p valve replacement. Planning antibiotics at least 4 weeks post-op (06/15/18) but prefer 6 weeks (06/29/18).  Gentamicin continues   Plan: Continue Gentamicin to 60 mg IV q12h for synergy dosing. Vancomycin 1500 mg iv Q 12 hours. Plan for at least weekly gent trough, with goal <1 for synergy. Follow up vancomycin trough  Height: 5\' 9"  (175.3 cm) Weight: 178 lb 12.8 oz (81.1 kg) IBW/kg (Calculated) : 70.7  Temp (24hrs), Avg:97.6 F (36.4 C), Min:97.5 F (36.4 C), Max:97.6 F (36.4 C)  Recent Labs  Lab 05/21/18 1719 05/22/18 0320 05/23/18 0500 05/23/18 0854 05/24/18 0528 05/26/18 0946 05/27/18 0500  WBC 11.2* 6.5  --  7.8 7.8 8.8  --   CREATININE  --   --  1.32*  --  1.42* 1.25* 1.14  GENTTROUGH  --   --   --   --  0.7  --   --     Estimated Creatinine Clearance: 89.6 mL/min (by C-G formula based on SCr of 1.14 mg/dL).    No Known Allergies   Antibiotics this admission: Vancomycin 5/20 x1 Ampicillin 5/20>>6/17 Gentamicin 5/20>> Vancomycin 6/17>    Levels/dose adjustments: 5/22 gent trough 0.5 mcg/ml - continue 100 mg IV q12hrs 5/28 gent trough 0.6 mcg/ml 5/31 gent trough 1.2 mcg/ml (drawn 9hrs post dose, true trough likely ~0.7) 6/3  gent trough 0.6 mc/gml 6/10 gent trough 1.3 mcg/ml > decr to 60 mg q12h 6/13 gent trough 0.7 mcg/ml  Culture data: 5/19 Blood x 2: Enterococcus > pan sensitive 5/19 BCID: Enterococcus, no resistance 5/21 blood x 2 - negative 6/7 tissue (aortic valve) - no organisms on gram stain, but now showing staph hom, resistant to oxacillin   Thank you Okey RegalLisa Kamalani Mastro, PharmD [phone]  05/28/2018 2:07 PM

## 2018-05-29 MED FILL — traMADol HCL 50 MG TABS: 50 | 3 days supply | Qty: 30 | Fill #0

## 2018-05-29 MED FILL — LINEZOLID 600 MG TAB: 600 | 30 days supply | Qty: 60 | Fill #0

## 2018-05-30 ENCOUNTER — Ambulatory Visit (INDEPENDENT_AMBULATORY_CARE_PROVIDER_SITE_OTHER): Payer: Self-pay | Admitting: *Deleted

## 2018-05-30 DIAGNOSIS — Z5181 Encounter for therapeutic drug level monitoring: Secondary | ICD-10-CM | POA: Insufficient documentation

## 2018-05-30 DIAGNOSIS — Z952 Presence of prosthetic heart valve: Secondary | ICD-10-CM

## 2018-05-30 DIAGNOSIS — Z0000 Encounter for general adult medical examination without abnormal findings: Secondary | ICD-10-CM | POA: Insufficient documentation

## 2018-05-30 LAB — POCT INR: INR: 2.6 (ref 2.0–3.0)

## 2018-05-30 NOTE — Patient Instructions (Addendum)
A full discussion of the nature of anticoagulants has been carried out.  A benefit risk analysis has been presented to the patient, so that they understand the justification for choosing anticoagulation at this time. The need for frequent and regular monitoring, precise dosage adjustment and compliance is stressed.  Side effects of potential bleeding are discussed.  The patient should avoid any OTC items containing aspirin or ibuprofen, and should avoid great swings in general diet.  Avoid alcohol consumption.  Call if any signs of abnormal bleeding. Marland Kitchen. Description   Do not take coumadin today June 19th then change dose of coumadin to 1/2 tablet (2.5mg ) daily except 1 tablet (5mg ) on Fridays and Mondays  Recheck INR in 1 week  Call with any questions if ordered new medications or if scheduled for any procedures

## 2018-06-06 ENCOUNTER — Ambulatory Visit (INDEPENDENT_AMBULATORY_CARE_PROVIDER_SITE_OTHER): Payer: Self-pay | Admitting: *Deleted

## 2018-06-06 DIAGNOSIS — Z5181 Encounter for therapeutic drug level monitoring: Secondary | ICD-10-CM

## 2018-06-06 DIAGNOSIS — Z952 Presence of prosthetic heart valve: Secondary | ICD-10-CM

## 2018-06-06 LAB — POCT INR: INR: 3.9 — AB (ref 2.0–3.0)

## 2018-06-06 NOTE — Patient Instructions (Signed)
Description   Do not take coumadin today June 26th and no coumadin June 27th then  change dose of coumadin to 1/2 tablet (2.5mg ) daily   Recheck INR in 1 week  Call with any questions if ordered new medications or if scheduled for any procedures [phone] Try to do 1 serving of greens a week

## 2018-06-08 ENCOUNTER — Other Ambulatory Visit: Payer: Self-pay | Admitting: Surgery

## 2018-06-08 DIAGNOSIS — Z952 Presence of prosthetic heart valve: Secondary | ICD-10-CM

## 2018-06-13 ENCOUNTER — Ambulatory Visit (INDEPENDENT_AMBULATORY_CARE_PROVIDER_SITE_OTHER): Payer: Self-pay | Admitting: Cardiovascular Disease

## 2018-06-13 ENCOUNTER — Encounter (HOSPITAL_COMMUNITY): Payer: Self-pay | Admitting: Dentistry

## 2018-06-13 ENCOUNTER — Ambulatory Visit (INDEPENDENT_AMBULATORY_CARE_PROVIDER_SITE_OTHER): Payer: Self-pay | Admitting: *Deleted

## 2018-06-13 ENCOUNTER — Encounter: Payer: Self-pay | Admitting: Cardiovascular Disease

## 2018-06-13 ENCOUNTER — Encounter: Payer: Self-pay | Admitting: Surgery

## 2018-06-13 ENCOUNTER — Ambulatory Visit (INDEPENDENT_AMBULATORY_CARE_PROVIDER_SITE_OTHER): Payer: Self-pay | Admitting: Surgery

## 2018-06-13 ENCOUNTER — Other Ambulatory Visit: Payer: Self-pay | Admitting: Behavioral Health

## 2018-06-13 ENCOUNTER — Inpatient Hospital Stay: Payer: Self-pay | Admitting: Internal Medicine

## 2018-06-13 ENCOUNTER — Other Ambulatory Visit: Payer: Self-pay

## 2018-06-13 ENCOUNTER — Ambulatory Visit
Admission: RE | Admit: 2018-06-13 | Discharge: 2018-06-13 | Disposition: A | Discharge status: Home / Self Care | Payer: Self-pay | Source: Ambulatory Visit | Attending: Surgery | Admitting: Surgery

## 2018-06-13 ENCOUNTER — Ambulatory Visit (HOSPITAL_COMMUNITY): Payer: Self-pay | Admitting: Dentistry

## 2018-06-13 VITALS — BP 101/69 | HR 73 | Temp 98.1°F

## 2018-06-13 VITALS — BP 100/72 | HR 80 | Ht 70.0 in | Wt 181.1 lb

## 2018-06-13 VITALS — BP 111/72 | HR 79 | Resp 18 | Ht 70.0 in | Wt 178.0 lb

## 2018-06-13 DIAGNOSIS — I330 Acute and subacute infective endocarditis: Secondary | ICD-10-CM

## 2018-06-13 DIAGNOSIS — K08199 Complete loss of teeth due to other specified cause, unspecified class: Secondary | ICD-10-CM

## 2018-06-13 DIAGNOSIS — Z952 Presence of prosthetic heart valve: Secondary | ICD-10-CM

## 2018-06-13 DIAGNOSIS — M272 Inflammatory conditions of jaws: Secondary | ICD-10-CM

## 2018-06-13 DIAGNOSIS — Z5181 Encounter for therapeutic drug level monitoring: Secondary | ICD-10-CM

## 2018-06-13 DIAGNOSIS — I351 Nonrheumatic aortic (valve) insufficiency: Secondary | ICD-10-CM

## 2018-06-13 LAB — POCT INR: INR: 1.7 — AB (ref 2.0–3.0)

## 2018-06-13 NOTE — Patient Instructions (Signed)
PLAN: 1. Continue salt water rinses as needed to aid healing. 2. Brush teeth after meals and at bedtime. 3. Follow-up with a dentist of his choice for continued comprehensive dental care to include exam, radiographs, and discussion of other dental treatment needs. 4. Patient will need antibiotic premedication prior to invasive dental procedures due to previous heart valve surgery per American Heart Association guidelines.   Charlynne Panderonald F. Kulinski, DDS

## 2018-06-13 NOTE — Progress Notes (Signed)
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  He was treated for a couple weeks preoperatively with intravenous antibiotics since he was doing well clinically.  He had dental evaluation and subsequent extraction of his remaining poor dentition. The patient's early postoperative recovery while in the hospital was notable for an uncomplicated postoperative course.  Unfortunately he needed to be discharged prior to completing his course of intravenous antibiotics because he was losing his place of residence and needed to remove all of his belongings.  He was discharged on Zyvox. Since hospital discharge the patient reports that he has been feeling well.  He denies any fever or chills.  He has mild incisional soreness.  He has had no chest pain or shortness of breath.  He still gets tired with activity but this is improving.  His last INR on 06/06/2018 was 3.9 and his Coumadin dose was decreased to 2.5 mg/day from 5 mg/day. He does report today noticing a small area of exposed bone on the right mandibular gum which has swollen up at times and drained some purulent appearing fluid.  He has avoided brushing this area.   Current Outpatient Medications  Medication Sig Dispense Refill  . aspirin EC 81 MG EC tablet Take 1 tablet (81 mg total) by mouth daily.    Marland Kitchen linezolid (ZYVOX) 600 MG tablet Take 1 tablet (600 mg total) by mouth 2 (two) times daily. 60 tablet 1  . methadone (DOLOPHINE) 10 MG tablet Take 100 mg by mouth daily.    . metoprolol tartrate (LOPRESSOR) 25 MG tablet Take 1 tablet (25 mg total) by mouth 2 (two) times daily. 60 tablet 3  . traMADol (ULTRAM) 50 MG tablet Take 1-2 tablets (50-100 mg total) by mouth every 4 (four) hours as needed for moderate pain. 30 tablet 0  . warfarin (COUMADIN) 5 MG tablet Take 1 tablet (5 mg  total) by mouth daily at 6 PM. 30 tablet 3   No current facility-administered medications for this visit.     Physical Exam: BP 111/72 (BP Location: Left Arm, Patient Position: Sitting, Cuff Size: Normal)   Pulse 79   Resp 18   Ht 5\' 10"  (1.778 m)   Wt 178 lb (80.7 kg)   SpO2 97% Comment: RA  BMI 25.54 kg/m  He looks well Examination of his mouth shows a tiny area of what appears to be exposed bone on the lingual side of the right mandibular gum measuring a couple millimeters in diameter.  There is no swelling or drainage.  There is no erythema.  The remainder of his gums appear to be healing well. Lungs are clear Cardiac exam shows a regular rate and rhythm with normal mechanical heart sounds. The chest incision is healing well and the sternum is stable. There is no peripheral edema.   Diagnostic Tests:  CLINICAL DATA:  Status post AVR.  Mild chest discomfort.  EXAM: CHEST - 2 VIEW  COMPARISON:  05/21/2018.  FINDINGS: The heart remains enlarged. Median sternotomy. Improved aeration with partial clearing of atelectasis. Minimal residual linear density at the LEFT face. Decreased effusions. Increased lung volumes. Chronic deformities of the LEFT upper ribs. Chronic deformity of LEFT humerus with ORIF.  IMPRESSION: Improved aeration.  Cardiomegaly without acute findings.   Electronically Signed   By: Elsie Stain M.D.   On: 06/13/2018 10:42  Impression:  Overall I think he is making a good recovery following his aortic valve replacement surgery for endocarditis.  He continues on Zyvox and has an appointment later today with Dr. Luciana Axeomer of infectious disease for follow-up.  His last INR was supratherapeutic and he has a follow-up visit to the anticoagulation clinic later today for repeat INR.  His ON-X valve only requires an INR of 2-2.5 for the first 3 months postoperatively and then his INR can be maintained at 1.5-2.  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  He says that he continues to abstain from any drug use and I congratulated him and strongly encouraged him to remain drug-free.  Plan:  He has a follow-up appointment with Dr. Kristin BruinsKulinski this morning.  He has an appointment in anticoagulation clinic later today for repeat INR.  He has an appointment later today with Dr. Elease HashimotoNahser for cardiology follow-up.  He has an appointment later today with Dr. Luciana Axeomer in the infectious disease clinic.  I will see him back in 2 months for follow-up.   Alleen BorneBryan K Ogechi Kuehnel, MD [facility] [phone]

## 2018-06-13 NOTE — Patient Instructions (Addendum)
Medication Instructions:  Your physician recommends that you continue on your current medications as directed. Please refer to the Current Medication list given to you today.   Labwork: None Ordered   Testing/Procedures: None Ordered   Follow-Up: Your physician recommends that you return for your appointment on October 7   If you need a refill on your cardiac medications before your next appointment, please call your pharmacy.   Thank you for choosing [facility]! Eligha BridegroomMichelle Bowden Boody, RN [phone]

## 2018-06-13 NOTE — Progress Notes (Unsigned)
c 

## 2018-06-13 NOTE — Patient Instructions (Signed)
Description   Start taking 1/2 tablet everyday except 1 tablet on Wednesdays.  Recheck INR in 1 week  Call with any questions if ordered new medications or if scheduled for any procedures [phone] Try to do 1 serving of greens a week

## 2018-06-13 NOTE — Progress Notes (Signed)
LIMITED ORAL EXAMINATION:  06/13/2018 Jeffery Bennett [id number]  VITALS: BP 101/69 (BP Location: Right Arm)   Pulse 73   Temp 98.1 F (36.7 C)   LABS:  Lab Results  Component Value Date   WBC 8.8 05/26/2018   HGB 8.7 (L) 05/26/2018   HCT 26.7 (L) 05/26/2018   MCV 81.2 05/26/2018   PLT 378 05/26/2018   BMET    Component Value Date/Time   NA 135 05/27/2018 0500   K 2.9 (L) 05/27/2018 0500   CL 96 (L) 05/27/2018 0500   CO2 30 05/27/2018 0500   GLUCOSE 146 (H) 05/27/2018 0500   BUN 16 05/27/2018 0500   CREATININE 1.14 05/27/2018 0500   CALCIUM 8.7 (L) 05/27/2018 0500   GFRNONAA >60 05/27/2018 0500   GFRAA >60 05/27/2018 0500    Lab Results  Component Value Date   INR 3.9 (A) 06/06/2018   INR 2.6 05/30/2018   INR 1.32 05/28/2018   No results found for: PTT   Jeffery AlbeBrian K Maye is status post multiple extractions with alveoloplasty and gross debridement of remaining dentition in the operating room with general anesthesia on 05/16/2018.  The patient now presents for evaluation of healing and for suture removal as needed.  SUBJECTIVE: Patient is complaining of lower right quadrant discomfort and possible exposed bone.  EXAM: There is no sign of infection, heme, or ooze. There is a 2 x 3 mm thin bony sequestrum in the area of #31 on the lingual aspect. No sutures remain. There is no palpable lymphadenopathy. There is no evidence of intraoral swelling noted.  PROCEDURE: The patient was given a chlorhexidine gluconate rinse for 30 seconds. The bony sequestrum was removed with a soft tissue pickups without complication. No significant bleeding was noted. Patient tolerated the procedure well. Orthopantogram was taken. Radiographic interpretation: Multiple missing teeth are noted. Recent dental extraction sites are noted. Multiple dental caries noted. Incipient to moderate bone loss.  ASSESSMENT: Post operative course is consistent with dental procedures performed in the  operating room. Loss of teeth due to extractions Bony sequestrum that was removed without complications.  PLAN: 1. Continue salt water rinses as needed to aid healing. 2. Brush teeth after meals and at bedtime. 3. Follow-up with a dentist of his choice for continued comprehensive dental care to include exam, radiographs, and discussion of other dental treatment needs. 4. Patient will need antibiotic premedication prior to invasive dental procedures due to previous heart valve surgery per American Heart Association guidelines.   Charlynne Panderonald F. Rolen Conger, DDS
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
## 2018-06-15 ENCOUNTER — Ambulatory Visit (INDEPENDENT_AMBULATORY_CARE_PROVIDER_SITE_OTHER): Payer: Self-pay | Admitting: Infectious Diseases

## 2018-06-15 ENCOUNTER — Encounter: Payer: Self-pay | Admitting: Infectious Diseases

## 2018-06-15 VITALS — BP 118/75 | HR 75 | Temp 97.5°F | Ht 70.0 in | Wt 181.0 lb

## 2018-06-15 DIAGNOSIS — Z952 Presence of prosthetic heart valve: Secondary | ICD-10-CM

## 2018-06-15 DIAGNOSIS — R220 Localized swelling, mass and lump, head: Secondary | ICD-10-CM

## 2018-06-15 DIAGNOSIS — I330 Acute and subacute infective endocarditis: Secondary | ICD-10-CM

## 2018-06-15 DIAGNOSIS — B1920 Unspecified viral hepatitis C without hepatic coma: Secondary | ICD-10-CM

## 2018-06-15 DIAGNOSIS — F1910 Other psychoactive substance abuse, uncomplicated: Secondary | ICD-10-CM

## 2018-06-15 DIAGNOSIS — R202 Paresthesia of skin: Secondary | ICD-10-CM

## 2018-06-15 DIAGNOSIS — R200 Anesthesia of skin: Secondary | ICD-10-CM

## 2018-06-15 MED ORDER — METRONIDAZOLE 500 MG PO TABS: 500.0000 mg | ORAL_TABLET | Freq: Three times a day (TID) | ORAL | 0 refills | Status: AC

## 2018-06-15 MED FILL — metroNIDAZOLE 500 MG TABS: 500 | 7 days supply | Qty: 21 | Fill #0

## 2018-06-15 NOTE — Assessment & Plan Note (Signed)
He does have some swelling on the right jaw today but incisions inside mouth appear to be well healed. For concern of anaerobic/indolent component will add Metronidazole TID for 7 days. Linezolid offers good coverage against strep species.

## 2018-06-15 NOTE — Assessment & Plan Note (Addendum)
[facility]. Defer for now.

## 2018-06-15 NOTE — Assessment & Plan Note (Signed)
He has had follow up with Dr. Laneta SimmersBartle and healing nicely. I asked him to please reach out to his office specifically for any lift/activity restrictions that would impact the kind of work he looks for. 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

## 2018-06-15 NOTE — Assessment & Plan Note (Addendum)
Likely attributable to his prolonged linezolid use. Awaiting decision to re-admit. Continue for now.

## 2018-06-15 NOTE — Assessment & Plan Note (Addendum)
Appears to be doing well following AVR and has been very adherent to antibiotic regimen. He received 4 weeks of IV therapy with dual gent/ampicillin for this prior to transitioning to oral linezolid before discharge on 05/28/18.  He also had methicillin resistant staphylococcus hominis growing on valve tissue that we did not start vancomycin for until 6/15 - has had primarily oral therapy for.  We discussed finishing out therapy in the hospital with IV antibiotics. I explained to him that he has a new heart valve that we really need to do our justice for to take care of the best way we know how to and that presently is through IV antibiotics. He is having some side effects attributable to linezolid therapy as well. He will need the weekend to think this over and make this happen for admission next week. We will touch base Monday next week to see if we can get him directly admitted to finish out course of therapy through 07/07/18 for 6 weeks.

## 2018-06-15 NOTE — Assessment & Plan Note (Signed)
He continues to work with Methadone clinic daily. I congratulated his efforts to stay clean. He would ultimately like to transition to Suboxone for OUD replacement as he has side effects to the methadone that are not pleasant. He is working on [employer] process now.
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
## 2018-06-15 NOTE — Patient Instructions (Addendum)
Will add an antibiotic for your jaw to take three times a day for 7 days. This can cause some nausea and metallic taste in your mouth. If the swelling persists I would like to get imaging of your jaw to check out your bone.   Please continue your linezolid twice a day through July 15th. The tingling/numbness in your legs should get better once this is done.   Please come back in 1 month to check in. We can talk more about your hepatitis c treatment once you get insurance.   Mail in the [facility] Financial Assistance form to start this process as well.

## 2018-06-18 ENCOUNTER — Telehealth: Payer: Self-pay | Admitting: Infectious Diseases

## 2018-06-18 NOTE — Telephone Encounter (Signed)
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   He is in the process of securing housing after he lost a housing opportunity in the past due to recent prolonged hospitalization and is working with his wife now. He can promise me that he can come in Monday morning 7/15 for re-admission to the hospital and stay the whole time (4 weeks). Apparently there is no way his wife can facilitate this communication after I tried to discuss options with him.  Will go ahead and set appt with Jeffery SoursGreg, NP @ [facility] for Monday 7/15 @ 9:30 AM to help arrange direct admission. There is a possibility he can come earlier this week and he will call to let me know. He cannot offer anything else right now.   Jeffery AlbertsStephanie Dixon, NP

## 2018-06-22 ENCOUNTER — Ambulatory Visit (INDEPENDENT_AMBULATORY_CARE_PROVIDER_SITE_OTHER): Payer: Self-pay | Admitting: *Deleted

## 2018-06-22 DIAGNOSIS — Z952 Presence of prosthetic heart valve: Secondary | ICD-10-CM

## 2018-06-22 DIAGNOSIS — Z5181 Encounter for therapeutic drug level monitoring: Secondary | ICD-10-CM

## 2018-06-22 LAB — POCT INR: INR: 1.8 — AB (ref 2.0–3.0)

## 2018-06-22 NOTE — Patient Instructions (Signed)
Description   Start taking 1/2 tablet everyday except 1 tablet on Tuesdays and Fridays.   Recheck INR in 10 days.  Call with any questions if ordered new medications or if scheduled for any procedures [phone] Try to do 1 serving of greens a week

## 2018-06-22 NOTE — Telephone Encounter (Signed)
[facility] calling to make sure pateint is suppose to come to our office on Monday for admission. He is at this office for an appointment.   I verified he should come on Monday for direct admit from the office.

## 2018-06-22 NOTE — Telephone Encounter (Signed)
Thank you Tammy!

## 2018-06-25 ENCOUNTER — Other Ambulatory Visit: Payer: Self-pay | Admitting: Pharmacist

## 2018-06-25 ENCOUNTER — Encounter: Payer: Self-pay | Admitting: Family

## 2018-06-25 ENCOUNTER — Ambulatory Visit (INDEPENDENT_AMBULATORY_CARE_PROVIDER_SITE_OTHER): Payer: Self-pay | Admitting: Family

## 2018-06-25 ENCOUNTER — Telehealth: Payer: Self-pay | Admitting: Infectious Diseases

## 2018-06-25 ENCOUNTER — Ambulatory Visit: Payer: Self-pay | Admitting: Family

## 2018-06-25 VITALS — BP 132/82 | HR 76 | Temp 97.5°F | Wt 175.0 lb

## 2018-06-25 DIAGNOSIS — I330 Acute and subacute infective endocarditis: Secondary | ICD-10-CM

## 2018-06-25 DIAGNOSIS — B958 Unspecified staphylococcus as the cause of diseases classified elsewhere: Secondary | ICD-10-CM | POA: Insufficient documentation

## 2018-06-25 DIAGNOSIS — Z5181 Encounter for therapeutic drug level monitoring: Secondary | ICD-10-CM

## 2018-06-25 MED ORDER — LINEZOLID 600 MG PO TABS: 600.0000 mg | ORAL_TABLET | Freq: Two times a day (BID) | ORAL | 0 refills | Status: DC
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
## 2018-06-25 NOTE — Assessment & Plan Note (Addendum)
Mr. Jeffery Bennett continues to receive treatment for Staphylococcus Hominis and Enterococcal faecalis endocarditis s/p aortic valve replacement. He is currently taking Linezolid as prescribed with no significant adverse side effects, although he could potentially have early onset neuropathy as previous noted. This is improved today. Discussed that in an ideal situation treatment would be completed through the IV route as this is he most effective treatment route for endocarditis which would ultimately require hospitalization. He is not a candidate for PICC line secondary to his history of IV drug use. He has remained clean since prior to surgery. Discussed that the efficacy of Linezolid has not been studied. It does have nearly 100% bioavailability so I do think that this is not an unreasonable option.  He will speak with Rexene AlbertsStephanie Dixon, NP his primary NP today to further discuss treatment.

## 2018-06-25 NOTE — Patient Instructions (Signed)
Nice to see you.  I will have Judeth CornfieldStephanie give you a call.  Consider your options although per our discussion IV is best

## 2018-06-25 NOTE — Telephone Encounter (Signed)
Attempted to call patient back about treatment - left voicemail to call back. After discussion with Tammy SoursGreg today it seems he would prefer to stay out of the hospital on Linezolid therapy and declined admission with Vancomycin. I reviewed notes and timeline inpatient and he will need linezolid through 07/10/18 (vanc was actually not ordered until 6/17 and was never given). Will send in 13 more doses of linezolid to get him through this 6 week period.   He needs a lab visit this week or early next week to check a CBC to ensure his platelets are OK.   Needs follow up with either me or Dr. Drue SecondSnider in mid-August to see how he is doing off antibiotics and consideration for hep C treatment.   Rexene AlbertsStephanie Dixon, MSN, NP-C [facility] Office: [phone] Pager: [phone]  06/25/2018  1:33 PM
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
## 2018-06-25 NOTE — Progress Notes (Signed)
Called patient to get information for patient assistance for Linezolid. No answer, left VM. Will try again later.

## 2018-06-26 ENCOUNTER — Telehealth: Payer: Self-pay | Admitting: Pharmacist

## 2018-06-26 DIAGNOSIS — B182 Chronic viral hepatitis C: Secondary | ICD-10-CM

## 2018-06-26 DIAGNOSIS — E876 Hypokalemia: Secondary | ICD-10-CM

## 2018-06-26 NOTE — Addendum Note (Signed)
Addended by: Blanchard KelchIXON, STEPHANIE N on: 06/26/2018 03:38 PM   Modules accepted: Orders

## 2018-06-26 NOTE — Telephone Encounter (Signed)
No I think just a CBC is good for now to check hgb/plt/wbcs. He needs hep C work up but I think we are putting that on hold until he can get insurance coverage for that.   Thank you so much!

## 2018-06-26 NOTE — Telephone Encounter (Signed)
Thank you :)

## 2018-06-26 NOTE — Telephone Encounter (Signed)
Ok no problem! Let me know if you want to proceed with Hep C - it's no issue with no insurance ([facility] financial, [employer] financial assistance, support path for meds, etc). Thanks!

## 2018-06-26 NOTE — Telephone Encounter (Signed)
Spoke with patient and verified everything needed to get linezolid patient assistance.  Patient was approved and 14 days of linezolid will be shipped to patient's home.   Also set up patient to come in next Tuesday for a CBC (will check with Judeth CornfieldStephanie if any other labs need to be drawn). Also set up patient to see Dr. Drue SecondSnider in late August.

## 2018-06-26 NOTE — Telephone Encounter (Signed)
I think I gave him [facility] Financial Assistance info to fax/mail in last visit. We can go ahead and try to arrange for labs and ultrasound to be done prior to his FU with Dr. Drue SecondSnider so she has all information for that as well if she/he decide to go forward then with treatment (which I think he will).   Will enter those orders as well. Thank you!

## 2018-06-26 NOTE — Addendum Note (Signed)
Addended by: Blanchard KelchIXON, STEPHANIE N on: 06/26/2018 04:00 PM   Modules accepted: Orders

## 2018-06-26 NOTE — Telephone Encounter (Signed)
Actually lets check bmet also - he has had hypokalemia on last few labs.   I can put the orders in.

## 2018-06-29 NOTE — Telephone Encounter (Signed)
Called patient to verify that he received the linezolid in the mail and he did.

## 2018-06-29 NOTE — Telephone Encounter (Signed)
Thank you for taking the step to do that. So wonderful! Glad he received it.

## 2018-06-29 NOTE — Telephone Encounter (Signed)
No problem at all!

## 2018-07-02 ENCOUNTER — Ambulatory Visit (INDEPENDENT_AMBULATORY_CARE_PROVIDER_SITE_OTHER): Payer: Self-pay | Admitting: *Deleted

## 2018-07-02 DIAGNOSIS — Z5181 Encounter for therapeutic drug level monitoring: Secondary | ICD-10-CM

## 2018-07-02 DIAGNOSIS — Z952 Presence of prosthetic heart valve: Secondary | ICD-10-CM

## 2018-07-02 LAB — POCT INR: INR: 1.3 — AB (ref 2.0–3.0)

## 2018-07-02 NOTE — Patient Instructions (Signed)
Description   Today  Take 1 tablet, then start taking 1 tablet daily except 1/2 tablet on Mondays, Wednesdays and Fridays.  Recheck INR in 10 days.  Call with any questions if ordered new medications or if scheduled for any procedures [phone]. Try to do 1 serving of greens a week

## 2018-07-03 ENCOUNTER — Other Ambulatory Visit: Payer: Self-pay

## 2018-07-05 ENCOUNTER — Other Ambulatory Visit: Payer: Self-pay

## 2018-07-05 DIAGNOSIS — E876 Hypokalemia: Secondary | ICD-10-CM

## 2018-07-05 DIAGNOSIS — Z5181 Encounter for therapeutic drug level monitoring: Secondary | ICD-10-CM

## 2018-07-05 DIAGNOSIS — B182 Chronic viral hepatitis C: Secondary | ICD-10-CM

## 2018-07-06 LAB — PROTIME-INR
INR: 1.1
Prothrombin Time: 11.2 s (ref 9.0–11.5)

## 2018-07-06 LAB — CBC
HEMATOCRIT: 32.1 % — AB (ref 38.5–50.0)
HEMOGLOBIN: 10.8 g/dL — AB (ref 13.2–17.1)
MCH: 28.3 pg (ref 27.0–33.0)
MCHC: 33.6 g/dL (ref 32.0–36.0)
MCV: 84 fL (ref 80.0–100.0)
MPV: 9.9 fL (ref 7.5–12.5)
Platelets: 356 10*3/uL (ref 140–400)
RBC: 3.82 10*6/uL — ABNORMAL LOW (ref 4.20–5.80)
RDW: 20.6 % — ABNORMAL HIGH (ref 11.0–15.0)
WBC: 7.4 10*3/uL (ref 3.8–10.8)

## 2018-07-06 LAB — BASIC METABOLIC PANEL
BUN: 22 mg/dL (ref 7–25)
CHLORIDE: 99 mmol/L (ref 98–110)
CO2: 28 mmol/L (ref 20–32)
CREATININE: 1.28 mg/dL (ref 0.60–1.35)
Calcium: 9.4 mg/dL (ref 8.6–10.3)
Glucose, Bld: 125 mg/dL — ABNORMAL HIGH (ref 65–99)
POTASSIUM: 4.2 mmol/L (ref 3.5–5.3)
Sodium: 137 mmol/L (ref 135–146)

## 2018-07-07 LAB — LIVER FIBROSIS, FIBROTEST-ACTITEST
ALT: 19 U/L (ref 9–46)
APOLIPOPROTEIN A1: 169 mg/dL (ref 94–176)
Alpha-2-Macroglobulin: 181 mg/dL (ref 106–279)
BILIRUBIN: 0.3 mg/dL (ref 0.2–1.2)
Fibrosis Score: 0.06
GGT: 19 U/L (ref 3–90)
HAPTOGLOBIN: 166 mg/dL (ref 43–212)
Necroinflammat ACT Score: 0.05
REFERENCE ID: 2570621

## 2018-07-12 ENCOUNTER — Ambulatory Visit: Payer: Self-pay | Admitting: Infectious Diseases

## 2018-07-17 ENCOUNTER — Ambulatory Visit (INDEPENDENT_AMBULATORY_CARE_PROVIDER_SITE_OTHER): Payer: Self-pay

## 2018-07-17 DIAGNOSIS — Z952 Presence of prosthetic heart valve: Secondary | ICD-10-CM

## 2018-07-17 DIAGNOSIS — Z5181 Encounter for therapeutic drug level monitoring: Secondary | ICD-10-CM

## 2018-07-17 LAB — POCT INR: INR: 1.1 — AB (ref 2.0–3.0)

## 2018-07-17 NOTE — Patient Instructions (Signed)
Description   Start taking 1 tablet daily.  Recheck INR in 7 days.  Call with any questions if ordered new medications or if scheduled for any procedures [phone].

## 2018-07-25 ENCOUNTER — Ambulatory Visit (INDEPENDENT_AMBULATORY_CARE_PROVIDER_SITE_OTHER): Payer: Self-pay | Admitting: Pharmacist

## 2018-07-25 DIAGNOSIS — Z5181 Encounter for therapeutic drug level monitoring: Secondary | ICD-10-CM

## 2018-07-25 DIAGNOSIS — Z952 Presence of prosthetic heart valve: Secondary | ICD-10-CM

## 2018-07-25 LAB — POCT INR: INR: 1.6 — AB (ref 2.0–3.0)

## 2018-07-25 NOTE — Patient Instructions (Signed)
Description   Take an extra 1/2 tablet today, then start taking 1 tablet daily except 1.5 tablets on Mondays and Fridays. Recheck INR in 7 days. Call with any questions if ordered new medications or if scheduled for any procedures [phone].

## 2018-08-02 ENCOUNTER — Ambulatory Visit (INDEPENDENT_AMBULATORY_CARE_PROVIDER_SITE_OTHER): Payer: Self-pay | Admitting: *Deleted

## 2018-08-02 DIAGNOSIS — Z952 Presence of prosthetic heart valve: Secondary | ICD-10-CM

## 2018-08-02 DIAGNOSIS — Z5181 Encounter for therapeutic drug level monitoring: Secondary | ICD-10-CM

## 2018-08-02 LAB — POCT INR: INR: 2.7 (ref 2.0–3.0)

## 2018-08-02 NOTE — Patient Instructions (Signed)
Description   Start taking 1.5 tablets daily except 1 tablet on Thursdays. Recheck INR in 7 days. Call with any questions if ordered new medications or if scheduled for any procedures [phone].

## 2018-08-06 ENCOUNTER — Ambulatory Visit: Payer: Self-pay | Admitting: Internal Medicine

## 2018-08-09 ENCOUNTER — Ambulatory Visit (INDEPENDENT_AMBULATORY_CARE_PROVIDER_SITE_OTHER): Payer: Self-pay | Admitting: Pharmacist

## 2018-08-09 DIAGNOSIS — Z5181 Encounter for therapeutic drug level monitoring: Secondary | ICD-10-CM

## 2018-08-09 DIAGNOSIS — Z952 Presence of prosthetic heart valve: Secondary | ICD-10-CM

## 2018-08-09 LAB — POCT INR: INR: 2.9 (ref 2.0–3.0)

## 2018-08-09 NOTE — Patient Instructions (Signed)
Description   Today take 0.5 tablet of warfarin then start taking 1.5 tablets daily except 1 tablet on Thursdays and Sundays. Recheck INR in 7 days. Call with any questions if ordered new medications or if scheduled for any procedures [phone].

## 2018-08-15 ENCOUNTER — Encounter: Payer: Self-pay | Admitting: Surgery

## 2018-08-15 ENCOUNTER — Other Ambulatory Visit: Payer: Self-pay

## 2018-08-15 ENCOUNTER — Ambulatory Visit (INDEPENDENT_AMBULATORY_CARE_PROVIDER_SITE_OTHER): Payer: Self-pay | Admitting: Surgery

## 2018-08-15 VITALS — BP 124/64 | HR 85 | Resp 20 | Ht 70.0 in | Wt 187.0 lb

## 2018-08-15 DIAGNOSIS — Z952 Presence of prosthetic heart valve: Secondary | ICD-10-CM
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
## 2018-08-17 ENCOUNTER — Ambulatory Visit (INDEPENDENT_AMBULATORY_CARE_PROVIDER_SITE_OTHER): Payer: Self-pay | Admitting: *Deleted

## 2018-08-17 DIAGNOSIS — Z952 Presence of prosthetic heart valve: Secondary | ICD-10-CM

## 2018-08-17 DIAGNOSIS — Z5181 Encounter for therapeutic drug level monitoring: Secondary | ICD-10-CM

## 2018-08-17 LAB — POCT INR: INR: 2.2 (ref 2.0–3.0)

## 2018-08-17 NOTE — Patient Instructions (Addendum)
Description   Today take 1 tablet of warfarin then start taking 1.5 tablets daily except 1 tablet on Tuesdays, Thursdays, Saturdays.  Recheck INR in 7 days. INR goal to be reduced to 1.5-2.0 on 08/17/18. Call with any questions if ordered new medications or if scheduled for any procedures [phone].

## 2018-08-24 ENCOUNTER — Ambulatory Visit (INDEPENDENT_AMBULATORY_CARE_PROVIDER_SITE_OTHER): Payer: Self-pay | Admitting: *Deleted

## 2018-08-24 DIAGNOSIS — Z5181 Encounter for therapeutic drug level monitoring: Secondary | ICD-10-CM

## 2018-08-24 DIAGNOSIS — Z952 Presence of prosthetic heart valve: Secondary | ICD-10-CM

## 2018-08-24 LAB — POCT INR: INR: 2.2 (ref 2.0–3.0)

## 2018-08-24 NOTE — Patient Instructions (Addendum)
Description   Start taking 1 tablet daily except 1.5 tablets on Mondays, Wednesdays, and Fridays.  Recheck INR in 1 week. INR goal to be reduced to 1.5-2.0 on 08/17/18. Call with any questions if ordered new medications or if scheduled for any procedures [phone].

## 2018-09-17 ENCOUNTER — Ambulatory Visit: Payer: Self-pay | Admitting: Cardiovascular Disease

## 2018-09-21 ENCOUNTER — Ambulatory Visit (INDEPENDENT_AMBULATORY_CARE_PROVIDER_SITE_OTHER): Payer: Self-pay | Admitting: Pharmacist

## 2018-09-21 DIAGNOSIS — Z952 Presence of prosthetic heart valve: Secondary | ICD-10-CM

## 2018-09-21 DIAGNOSIS — Z5181 Encounter for therapeutic drug level monitoring: Secondary | ICD-10-CM

## 2018-09-21 LAB — POCT INR: INR: 1.4 — AB (ref 2.0–3.0)

## 2018-09-21 NOTE — Patient Instructions (Signed)
Description   Take 1.5 tablets today then start taking 1 tablet daily except 1.5 tablet on Fridays.  Recheck INR in 1 week. Call with any questions if ordered new medications or if scheduled for any procedures [phone].

## 2018-09-25 ENCOUNTER — Other Ambulatory Visit: Payer: Self-pay | Admitting: *Deleted

## 2018-09-25 ENCOUNTER — Other Ambulatory Visit: Payer: Self-pay | Admitting: Physician Assistant

## 2018-09-26 MED ORDER — WARFARIN SODIUM 5 MG PO TABS: 5.0000 mg | ORAL_TABLET | Freq: Every day | ORAL | 1 refills | Status: DC

## 2018-09-28 ENCOUNTER — Ambulatory Visit (INDEPENDENT_AMBULATORY_CARE_PROVIDER_SITE_OTHER): Payer: Self-pay | Admitting: *Deleted

## 2018-09-28 DIAGNOSIS — Z952 Presence of prosthetic heart valve: Secondary | ICD-10-CM

## 2018-09-28 DIAGNOSIS — Z5181 Encounter for therapeutic drug level monitoring: Secondary | ICD-10-CM

## 2018-09-28 LAB — POCT INR: INR: 1.2 — AB (ref 2.0–3.0)

## 2018-09-28 MED ORDER — WARFARIN SODIUM 5 MG PO TABS: 5.0000 mg | ORAL_TABLET | Freq: Every day | ORAL | 0 refills | Status: DC

## 2018-09-28 NOTE — Patient Instructions (Signed)
Description   Take 2 tablets today then start  taking 1 tablet daily except 1.5 tablet on Mondays and  Fridays.  Recheck INR in 10 days. Call with any questions if ordered new medications or if scheduled for any procedures [phone].

## 2018-10-05 ENCOUNTER — Encounter: Payer: Self-pay | Admitting: Cardiology

## 2018-10-05 ENCOUNTER — Ambulatory Visit (INDEPENDENT_AMBULATORY_CARE_PROVIDER_SITE_OTHER): Payer: Self-pay | Admitting: Cardiology

## 2018-10-05 ENCOUNTER — Ambulatory Visit (INDEPENDENT_AMBULATORY_CARE_PROVIDER_SITE_OTHER): Payer: Self-pay | Admitting: *Deleted

## 2018-10-05 VITALS — BP 130/78 | HR 78 | Ht 70.0 in | Wt 199.8 lb

## 2018-10-05 DIAGNOSIS — Z5181 Encounter for therapeutic drug level monitoring: Secondary | ICD-10-CM

## 2018-10-05 DIAGNOSIS — Z952 Presence of prosthetic heart valve: Secondary | ICD-10-CM

## 2018-10-05 LAB — POCT INR: INR: 1.5 — AB (ref 2.0–3.0)

## 2018-10-05 MED ORDER — METOPROLOL TARTRATE 25 MG PO TABS: 25.0000 mg | ORAL_TABLET | Freq: Two times a day (BID) | ORAL | 12 refills | Status: DC

## 2018-10-05 NOTE — Patient Instructions (Signed)
Medication Instructions:  Your physician recommends that you continue on your current medications as directed. Please refer to the Current Medication list given to you today.  If you need a refill on your cardiac medications before your next appointment, please call your pharmacy.   Lab work: None  If you have labs (blood work) drawn today and your tests are completely normal, you will receive your results only by: . MyChart Message (if you have MyChart) OR . A paper copy in the mail If you have any lab test that is abnormal or we need to change your treatment, we will call you to review the results.  Testing/Procedures: None  Follow-Up: At [facility], you and your health needs are our priority.  As part of our continuing mission to provide you with exceptional heart care, we have created designated Provider Care Teams.  These Care Teams include your primary Cardiologist (physician) and Advanced Practice Providers (APPs -  Physician Assistants and Nurse Practitioners) who all work together to provide you with the care you need, when you need it. You will need a follow up appointment in:  3-4 months.  Please call our office 2 months in advance to schedule this appointment.  You may see Philip [name], MD or one of the following Advanced Practice Providers on your designated Care Team: Scott Weaver, PA-C Vin Bhagat, PA-C . Janine Hammond, NP  Any Other Special Instructions Will Be Listed Below (If Applicable).  ] 

## 2018-10-05 NOTE — Patient Instructions (Signed)
Description   Continue taking 1 tablet daily except 1.5 tablet on Mondays and  Fridays.  Recheck INR in 2 weeks.  Call with any questions if ordered new medications or if scheduled for any procedures [phone].
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
## 2018-12-06 ENCOUNTER — Other Ambulatory Visit: Payer: Self-pay | Admitting: Cardiovascular Disease

## 2018-12-06 NOTE — Telephone Encounter (Signed)
Jeffery Bennett spoke with pt & made him an appt for 12/07/18.

## 2018-12-07 ENCOUNTER — Ambulatory Visit (INDEPENDENT_AMBULATORY_CARE_PROVIDER_SITE_OTHER): Payer: Self-pay | Admitting: *Deleted

## 2018-12-07 DIAGNOSIS — Z952 Presence of prosthetic heart valve: Secondary | ICD-10-CM

## 2018-12-07 DIAGNOSIS — Z5181 Encounter for therapeutic drug level monitoring: Secondary | ICD-10-CM

## 2018-12-07 LAB — POCT INR: INR: 2.1 (ref 2.0–3.0)

## 2018-12-07 NOTE — Patient Instructions (Signed)
Description   Tomorrow take 1/2 tablet then continue taking the dose you have been taking which is 1 tablet daily. Recheck INR in 3 weeks.  Call with any questions if ordered new medications or if scheduled for any procedures [phone].

## 2019-01-04 ENCOUNTER — Ambulatory Visit (INDEPENDENT_AMBULATORY_CARE_PROVIDER_SITE_OTHER): Payer: Self-pay | Admitting: *Deleted

## 2019-01-04 DIAGNOSIS — Z952 Presence of prosthetic heart valve: Secondary | ICD-10-CM

## 2019-01-04 DIAGNOSIS — Z5181 Encounter for therapeutic drug level monitoring: Secondary | ICD-10-CM

## 2019-01-04 LAB — POCT INR: INR: 1.2 — AB (ref 2.0–3.0)

## 2019-01-04 NOTE — Patient Instructions (Signed)
Description   Take 1.5 tablets today then continue taking 1 tablet daily. Recheck INR in 1 week.  Call with any questions if ordered new medications or if scheduled for any procedures [phone].

## 2019-01-10 ENCOUNTER — Ambulatory Visit (INDEPENDENT_AMBULATORY_CARE_PROVIDER_SITE_OTHER): Payer: Self-pay | Admitting: Pharmacist

## 2019-01-10 ENCOUNTER — Encounter: Payer: Self-pay | Admitting: Cardiovascular Disease

## 2019-01-10 ENCOUNTER — Ambulatory Visit (INDEPENDENT_AMBULATORY_CARE_PROVIDER_SITE_OTHER): Payer: Self-pay | Admitting: Cardiovascular Disease

## 2019-01-10 VITALS — BP 106/78 | HR 76 | Ht 70.0 in | Wt 213.0 lb

## 2019-01-10 DIAGNOSIS — I330 Acute and subacute infective endocarditis: Secondary | ICD-10-CM

## 2019-01-10 DIAGNOSIS — Z5181 Encounter for therapeutic drug level monitoring: Secondary | ICD-10-CM

## 2019-01-10 DIAGNOSIS — Z952 Presence of prosthetic heart valve: Secondary | ICD-10-CM

## 2019-01-10 LAB — POCT INR: INR: 2.7 (ref 2.0–3.0)

## 2019-01-10 MED ORDER — METOPROLOL TARTRATE 25 MG PO TABS: 25.0000 mg | ORAL_TABLET | Freq: Two times a day (BID) | ORAL | 12 refills | Status: DC

## 2019-01-10 MED ORDER — WARFARIN SODIUM 5 MG PO TABS: ORAL_TABLET | ORAL | 0 refills | Status: DC
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
## 2019-01-10 NOTE — Patient Instructions (Signed)
Skip coumadin tomorrow then resume 1 mg daily. Recheck INR in 1 week.  Call with any questions if ordered new medications or if scheduled for any procedures [phone].

## 2019-01-10 NOTE — Addendum Note (Signed)
Addended by: Malena Peer D on: 01/10/2019 04:24 PM   Modules accepted: Orders

## 2019-01-18 ENCOUNTER — Ambulatory Visit (INDEPENDENT_AMBULATORY_CARE_PROVIDER_SITE_OTHER): Payer: Self-pay | Admitting: Cardiology

## 2019-01-18 ENCOUNTER — Encounter: Payer: Self-pay | Admitting: Cardiology

## 2019-01-18 ENCOUNTER — Telehealth (HOSPITAL_COMMUNITY): Payer: Self-pay | Admitting: Cardiology

## 2019-01-18 VITALS — BP 116/72 | HR 72 | Ht 70.0 in | Wt 213.1 lb

## 2019-01-18 DIAGNOSIS — Z952 Presence of prosthetic heart valve: Secondary | ICD-10-CM

## 2019-01-18 DIAGNOSIS — F1910 Other psychoactive substance abuse, uncomplicated: Secondary | ICD-10-CM

## 2019-01-18 DIAGNOSIS — R609 Edema, unspecified: Secondary | ICD-10-CM

## 2019-01-18 MED ORDER — AMOXICILLIN 500 MG PO TABS: 2000.0000 mg | ORAL_TABLET | ORAL | 6 refills | Status: DC

## 2019-01-18 MED ORDER — FUROSEMIDE 20 MG PO TABS: 20.0000 mg | ORAL_TABLET | Freq: Every day | ORAL | 1 refills | Status: DC
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
## 2019-01-18 NOTE — Telephone Encounter (Signed)
Okay to change? Please advise. Thanks, MI

## 2019-01-18 NOTE — Patient Instructions (Addendum)
Medication Instructions:  1.) START: Lasix 20 mg daily for 3 days then use only as needed for swelling  2.) We have sent in Amoxicillin 500 mg (Take 4 pills 1 hour prior to dental cleanings, procedures and surgeries   If you need a refill on your cardiac medications before your next appointment, please call your pharmacy.   Lab work: TODAY: BMET & BNP  If you have labs (blood work) drawn today and your tests are completely normal, you will receive your results only by: Marland Kitchen MyChart Message (if you have MyChart) OR . A paper copy in the mail If you have any lab test that is abnormal or we need to change your treatment, we will call you to review the results.  Testing/Procedures: Your physician has requested that you have an echocardiogram. Echocardiography is a painless test that uses sound waves to create images of your heart. It provides your doctor with information about the size and shape of your heart and how well your heart's chambers and valves are working. This procedure takes approximately one hour. There are no restrictions for this procedure.   Follow-Up: At [facility], you and your health needs are our priority.  As part of our continuing mission to provide you with exceptional heart care, we have created designated Provider Care Teams.  These Care Teams include your primary Cardiologist (physician) and Advanced Practice Providers (APPs -  Physician Assistants and Nurse Practitioners) who all work together to provide you with the care you need, when you need it. You will need a follow up appointment in:  6 months.  Please call our office 2 months in advance to schedule this appointment.  You may see Kristeen Miss, MD or one of the following Advanced Practice Providers on your designated Care Team: Tereso Newcomer, PA-C Vin Clover, [place] . Berton Bon, NP  Any Other Special Instructions Will Be Listed Below (If Applicable).

## 2019-01-18 NOTE — Telephone Encounter (Signed)
°*  STAT* If patient is at the pharmacy, call can be transferred to refill team.   1. Which medications need to be refilled? (please list name of each medication and dose if known) amoxicillin (AMOXIL) 500 MG tablet  2. Which pharmacy/location (including street and city if local pharmacy) is medication to be sent to? [facility]  3. Do they need a 30 day or 90 day supply? 30  Pharmacy is requesting capsules instead of tablets

## 2019-01-19 LAB — BASIC METABOLIC PANEL
BUN/Creatinine Ratio: 13 (ref 9–20)
BUN: 15 mg/dL (ref 6–20)
CO2: 27 mmol/L (ref 20–29)
Calcium: 9.1 mg/dL (ref 8.7–10.2)
Chloride: 103 mmol/L (ref 96–106)
Creatinine, Ser: 1.13 mg/dL (ref 0.76–1.27)
GFR calc Af Amer: 95 mL/min/{1.73_m2} (ref >59)
GFR calc non Af Amer: 83 mL/min/{1.73_m2} (ref >59)
Glucose: 72 mg/dL (ref 65–99)
Potassium: 4.5 mmol/L (ref 3.5–5.2)
Sodium: 142 mmol/L (ref 134–144)

## 2019-01-19 LAB — PRO B NATRIURETIC PEPTIDE: NT-Pro BNP: 138 pg/mL — ABNORMAL HIGH (ref 0–86)

## 2019-01-24 ENCOUNTER — Other Ambulatory Visit (HOSPITAL_COMMUNITY): Payer: Self-pay

## 2019-01-28 ENCOUNTER — Encounter: Payer: Self-pay | Admitting: Cardiology

## 2019-02-09 ENCOUNTER — Other Ambulatory Visit: Payer: Self-pay | Admitting: Cardiovascular Disease

## 2019-02-11 ENCOUNTER — Telehealth: Payer: Self-pay

## 2019-02-11 NOTE — Telephone Encounter (Signed)
CALLED LEFT [facility] STATING THAT REFILL WILL NOT BE AUTHORIZED UNTIL SEEN

## 2019-02-11 NOTE — Telephone Encounter (Signed)
[facility] OVERDUE INR

## 2019-02-13 ENCOUNTER — Other Ambulatory Visit: Payer: Self-pay | Admitting: *Deleted

## 2019-02-13 NOTE — Telephone Encounter (Signed)
LMOM again for patient. 

## 2019-02-14 ENCOUNTER — Other Ambulatory Visit (HOSPITAL_COMMUNITY): Payer: Self-pay

## 2019-02-19 ENCOUNTER — Ambulatory Visit (INDEPENDENT_AMBULATORY_CARE_PROVIDER_SITE_OTHER): Payer: Self-pay | Admitting: *Deleted

## 2019-02-19 DIAGNOSIS — Z952 Presence of prosthetic heart valve: Secondary | ICD-10-CM

## 2019-02-19 DIAGNOSIS — Z5181 Encounter for therapeutic drug level monitoring: Secondary | ICD-10-CM

## 2019-02-19 LAB — POCT INR: INR: 1.1 — AB (ref 2.0–3.0)

## 2019-02-19 NOTE — Patient Instructions (Signed)
Description   Today take 1.5 tablets then resume 1 tablet daily. Recheck INR in 1 week.  Call with any questions if ordered new medications or if scheduled for any procedures [phone].

## 2019-02-22 ENCOUNTER — Other Ambulatory Visit: Payer: Self-pay

## 2019-02-22 ENCOUNTER — Ambulatory Visit (HOSPITAL_COMMUNITY): Payer: Self-pay | Attending: Cardiology

## 2019-02-22 DIAGNOSIS — Z952 Presence of prosthetic heart valve: Secondary | ICD-10-CM | POA: Insufficient documentation

## 2019-02-26 ENCOUNTER — Other Ambulatory Visit: Payer: Self-pay

## 2019-02-26 ENCOUNTER — Ambulatory Visit (INDEPENDENT_AMBULATORY_CARE_PROVIDER_SITE_OTHER): Payer: Self-pay | Admitting: *Deleted

## 2019-02-26 ENCOUNTER — Telehealth: Payer: Self-pay | Admitting: Cardiovascular Disease

## 2019-02-26 DIAGNOSIS — Z952 Presence of prosthetic heart valve: Secondary | ICD-10-CM

## 2019-02-26 DIAGNOSIS — Z5181 Encounter for therapeutic drug level monitoring: Secondary | ICD-10-CM

## 2019-02-26 LAB — POCT INR: INR: 2.3 (ref 2.0–3.0)

## 2019-02-26 NOTE — Telephone Encounter (Signed)
The patient has been notified of the result and verbalized understanding.  All questions (if any) were answered. Pt thanked me for the call. I advised pt we will call with any recommendations if any from Dr. Elease Hashimoto once he reviews results. Pt thanked me for the call again.  Danielle Rankin, [facility] 02/26/2019 3:57 PM

## 2019-02-26 NOTE — Patient Instructions (Signed)
Description   Today take 1/2 tablet then resume 1 tablet daily. Recheck INR in 2 weeks.  Call with any questions if ordered new medications or if scheduled for any procedures [phone].

## 2019-02-26 NOTE — Telephone Encounter (Signed)
New message   Patient is calling about echo results.

## 2019-03-11 ENCOUNTER — Telehealth: Payer: Self-pay | Admitting: Pharmacist

## 2019-03-11 NOTE — Telephone Encounter (Signed)
1. Do you currently have a fever? No (yes = cancel and refer to pcp for e-visit) 2. Have you recently travelled on a cruise, internationally, or to [place], [place], [place], [place], [place], or [place], [place] [employer]) ? No (yes = cancel, stay home, monitor symptoms, and contact pcp or initiate e-visit if symptoms develop) 3. Have you been in contact with someone that is currently pending confirmation of Covid19 testing or has been confirmed to have the Covid19 virus?  NO (yes = cancel, stay home, away from tested individual, monitor symptoms, and contact pcp or initiate e-visit if symptoms develop) 4. Are you currently experiencing fatigue or cough? No (yes = pt should be prepared to have a mask placed at the time of their visit).  Pt. Advised that we are restricting visitors at this time and anyone present in the vehicle should meet the above criteria as well. Advised that visit will be at curbside for finger stick ONLY and will receive call with instructions. Pt also advised to please bring own pen for signature of arrival document.

## 2019-03-12 ENCOUNTER — Other Ambulatory Visit: Payer: Self-pay

## 2019-03-12 ENCOUNTER — Ambulatory Visit (INDEPENDENT_AMBULATORY_CARE_PROVIDER_SITE_OTHER): Payer: Self-pay | Admitting: Pharmacist

## 2019-03-12 DIAGNOSIS — Z5181 Encounter for therapeutic drug level monitoring: Secondary | ICD-10-CM

## 2019-03-12 DIAGNOSIS — Z952 Presence of prosthetic heart valve: Secondary | ICD-10-CM

## 2019-03-12 LAB — POCT INR: INR: 2 (ref 2.0–3.0)

## 2019-04-01 ENCOUNTER — Telehealth: Payer: Self-pay

## 2019-04-01 NOTE — Telephone Encounter (Signed)

## 2019-04-05 ENCOUNTER — Other Ambulatory Visit: Payer: Self-pay

## 2019-04-05 ENCOUNTER — Ambulatory Visit (INDEPENDENT_AMBULATORY_CARE_PROVIDER_SITE_OTHER): Payer: Self-pay | Admitting: *Deleted

## 2019-04-05 DIAGNOSIS — Z5181 Encounter for therapeutic drug level monitoring: Secondary | ICD-10-CM

## 2019-04-05 DIAGNOSIS — Z952 Presence of prosthetic heart valve: Secondary | ICD-10-CM

## 2019-04-05 LAB — POCT INR: INR: 1.4 — AB (ref 2.0–3.0)

## 2019-04-05 MED ORDER — WARFARIN SODIUM 5 MG PO TABS: ORAL_TABLET | ORAL | 1 refills | Status: DC

## 2019-04-25 ENCOUNTER — Telehealth: Payer: Self-pay

## 2019-04-25 NOTE — Telephone Encounter (Signed)
lmom for prescreen  

## 2019-04-26 ENCOUNTER — Ambulatory Visit (INDEPENDENT_AMBULATORY_CARE_PROVIDER_SITE_OTHER): Payer: Self-pay | Admitting: *Deleted

## 2019-04-26 ENCOUNTER — Other Ambulatory Visit: Payer: Self-pay

## 2019-04-26 DIAGNOSIS — Z952 Presence of prosthetic heart valve: Secondary | ICD-10-CM

## 2019-04-26 DIAGNOSIS — Z5181 Encounter for therapeutic drug level monitoring: Secondary | ICD-10-CM

## 2019-04-26 LAB — POCT INR: INR: 2.6 (ref 2.0–3.0)

## 2019-04-26 NOTE — Patient Instructions (Signed)
Description   Spoke with pt and instructed pt to hold today's dose then continue taking 1 tablet daily. Recheck INR in 3 weeks.  Call with any questions if ordered new medications or if scheduled for any procedures [phone].

## 2019-04-30 ENCOUNTER — Telehealth: Payer: Self-pay | Admitting: Cardiovascular Disease

## 2019-04-30 MED ORDER — AMOXICILLIN 500 MG PO TABS: 2000.0000 mg | ORAL_TABLET | ORAL | 6 refills | Status: DC

## 2019-04-30 NOTE — Telephone Encounter (Signed)
Spoke with patient who states he has lost the amoxicillin Rx bottle and requests refills be sent to [facility] for an upcoming dental cleaning. He verbalized understanding of instructions for amoxicillin and thanked me for the call.

## 2019-04-30 NOTE — Telephone Encounter (Signed)
New message   Pt c/o medication issue:  1. Name of Medication: amoxicillin (AMOXIL) 500 MG tablet  2. How are you currently taking this medication (dosage and times per day)? Twice daily  3. Are you having a reaction (difficulty breathing--STAT)? No   4. What is your medication issue? Patient states that he has an upcoming tooth cleaning at the dentist and needs a new prescription for this medication. Please advise.

## 2019-05-13 ENCOUNTER — Other Ambulatory Visit: Payer: Self-pay | Admitting: Cardiovascular Disease

## 2019-05-13 MED ORDER — WARFARIN SODIUM 5 MG PO TABS: ORAL_TABLET | ORAL | 1 refills | Status: DC

## 2019-05-13 NOTE — Telephone Encounter (Signed)
This encounter was created in error - please disregard.

## 2019-05-13 NOTE — Telephone Encounter (Signed)
New Message                *STAT* If patient is at the pharmacy, call can be transferred to refill team.   1. Which medications need to be refilled? (please list name of each medication and dose if known) Warfin  2. Which pharmacy/location (including street and city if local pharmacy) is medication to be sent to? [facility] on [address]  3. Do they need a 30 day or 90 day supply? 10 day supply/ Patient has being out for the last 3 days

## 2019-05-14 ENCOUNTER — Telehealth: Payer: Self-pay

## 2019-05-14 NOTE — Telephone Encounter (Signed)

## 2019-05-17 ENCOUNTER — Ambulatory Visit (INDEPENDENT_AMBULATORY_CARE_PROVIDER_SITE_OTHER): Payer: Self-pay | Admitting: *Deleted

## 2019-05-17 ENCOUNTER — Other Ambulatory Visit: Payer: Self-pay

## 2019-05-17 DIAGNOSIS — Z952 Presence of prosthetic heart valve: Secondary | ICD-10-CM

## 2019-05-17 DIAGNOSIS — Z5181 Encounter for therapeutic drug level monitoring: Secondary | ICD-10-CM

## 2019-05-17 LAB — POCT INR: INR: 2.7 (ref 2.0–3.0)

## 2019-05-17 MED ORDER — WARFARIN SODIUM 5 MG PO TABS: ORAL_TABLET | ORAL | 1 refills | Status: DC

## 2019-05-17 NOTE — Patient Instructions (Signed)
Description   Hold today's dose then start taking 1 tablet daily except 1/2 tablets on Tuesdays. Recheck INR in 3 weeks.  Call with any questions if ordered new medications or if scheduled for any procedures [phone].

## 2019-05-20 ENCOUNTER — Telehealth: Payer: Self-pay | Admitting: Nurse Practitioner

## 2019-05-20 NOTE — Telephone Encounter (Signed)
  Pt would like a call re:leg swelling, red spots on feet and legs.  Pt verified phone number on file.   Left message for patient to call back regarding the message I received above from [facility]

## 2019-05-28 MED ORDER — POTASSIUM CHLORIDE ER 10 MEQ PO TBCR: EXTENDED_RELEASE_TABLET | ORAL | 3 refills | Status: AC

## 2019-05-28 NOTE — Telephone Encounter (Signed)
I spoke to the patient and informed him of Dr Harrington Challenger' recommendations.  He will take 40 mg of Lasix Today, Thursday and Saturday along with Potassium 10 mEq.  On Wednesday, Friday, Sunday he will take Lasix 20 mg with no Potassium.  He will call next week with results.

## 2019-05-28 NOTE — Telephone Encounter (Signed)
Can try increase of lasix to 40 mg every other day x 3  Take 20 on other days When taking 40  Take 10 of KCL (needs to be called in)  Call early next week with response

## 2019-05-28 NOTE — Telephone Encounter (Signed)
Spoke with pt and he states he still has swelling in both feet, left worse than right.  Denies pain or heat.  Does have red "splotches" on the tops of both feet.  Has Furosemide 20mg  that he has been taking daily for about a week now with minimal improvement.  Swelling improves during the night but comes back once he gets up moving around.  Pt states when he pushes on ankles, it does "leave an indention for a few seconds".  No vitals on hand but pt states BP and HR have been fine.  Advised I will send to Dr. Acie Fredrickson for review and advisement.

## 2019-06-03 ENCOUNTER — Telehealth: Payer: Self-pay

## 2019-06-03 NOTE — Telephone Encounter (Signed)
lmom for prescreen  

## 2019-06-06 NOTE — Telephone Encounter (Signed)

## 2019-06-12 ENCOUNTER — Other Ambulatory Visit: Payer: Self-pay | Admitting: Cardiovascular Disease

## 2019-06-12 NOTE — Telephone Encounter (Signed)
Pt missed last appt and is requesting 90 day supply refill. Appt has been rescheduled for 7/10. Left message for pt to see if he has enough Coumadin to last until his appt.

## 2019-06-12 NOTE — Telephone Encounter (Signed)
°*  STAT* If patient is at the pharmacy, call can be transferred to refill team.   1. Which medications need to be refilled? (please list name of each medication and dose if known) Warfarin 5 mg  2. Which pharmacy/location (including street and city if local pharmacy) is medication to be sent to? [facility] [address]  3. Do they need a 30 day or 90 day supply? [place]

## 2019-06-21 ENCOUNTER — Ambulatory Visit (INDEPENDENT_AMBULATORY_CARE_PROVIDER_SITE_OTHER): Payer: Self-pay | Admitting: *Deleted

## 2019-06-21 ENCOUNTER — Other Ambulatory Visit: Payer: Self-pay

## 2019-06-21 DIAGNOSIS — Z952 Presence of prosthetic heart valve: Secondary | ICD-10-CM

## 2019-06-21 DIAGNOSIS — Z5181 Encounter for therapeutic drug level monitoring: Secondary | ICD-10-CM

## 2019-06-21 LAB — POCT INR: INR: 2.8 (ref 2.0–3.0)

## 2019-06-21 NOTE — Patient Instructions (Signed)
Description   Hold today's dose then start taking 1 tablet daily except 1/2 tablets on Tuesdays (the dose you were supposed to be taking). Recheck INR in 3 weeks.  Call with any questions if ordered new medications or if scheduled for any procedures [phone].

## 2019-07-12 ENCOUNTER — Ambulatory Visit (INDEPENDENT_AMBULATORY_CARE_PROVIDER_SITE_OTHER): Payer: Self-pay | Admitting: *Deleted

## 2019-07-12 ENCOUNTER — Other Ambulatory Visit: Payer: Self-pay

## 2019-07-12 DIAGNOSIS — Z952 Presence of prosthetic heart valve: Secondary | ICD-10-CM

## 2019-07-12 DIAGNOSIS — Z5181 Encounter for therapeutic drug level monitoring: Secondary | ICD-10-CM

## 2019-07-12 LAB — POCT INR: INR: 1.2 — AB (ref 2.0–3.0)

## 2019-07-12 NOTE — Patient Instructions (Addendum)
Description   Take a extra 1/2 tablet today, then continue taking 1 tablet daily except 1/2 tablets on Tuesdays. Recheck INR in 2 weeks.  Call with any questions if ordered new medications or if scheduled for any procedures [phone].

## 2019-08-06 ENCOUNTER — Telehealth: Payer: Self-pay | Admitting: Cardiovascular Disease

## 2019-08-06 MED ORDER — WARFARIN SODIUM 5 MG PO TABS: ORAL_TABLET | ORAL | 0 refills | Status: DC

## 2019-08-06 NOTE — Telephone Encounter (Signed)
Pt overdue for follow up, missed last appt on 8/14. Has appt rescheduled for 8/28. Will send in 5 tablets to last pt until appt later this week and can send in further refills once pt keeps appt.

## 2019-08-06 NOTE — Telephone Encounter (Signed)
New Message    *STAT* If patient is at the pharmacy, call can be transferred to refill team.   1. Which medications need to be refilled? (please list name of each medication and dose if known) warfarin (COUMADIN) 5 MG tablet  2. Which pharmacy/location (including street and city if local pharmacy) is medication to be sent to? [place] ([place]), [place] - [address]  3. Do they need a 30 day or 90 day supply? 30 day supply

## 2019-08-09 ENCOUNTER — Ambulatory Visit (INDEPENDENT_AMBULATORY_CARE_PROVIDER_SITE_OTHER): Payer: Self-pay | Admitting: *Deleted

## 2019-08-09 ENCOUNTER — Other Ambulatory Visit: Payer: Self-pay

## 2019-08-09 DIAGNOSIS — Z5181 Encounter for therapeutic drug level monitoring: Secondary | ICD-10-CM

## 2019-08-09 DIAGNOSIS — Z952 Presence of prosthetic heart valve: Secondary | ICD-10-CM

## 2019-08-09 LAB — POCT INR: INR: 4.3 — AB (ref 2.0–3.0)

## 2019-08-09 MED ORDER — WARFARIN SODIUM 5 MG PO TABS: ORAL_TABLET | ORAL | 0 refills | Status: DC

## 2019-08-09 NOTE — Patient Instructions (Addendum)
Description   Do not take any Coumadin today and No Coumadin tomorrow, then continue taking 1 tablet daily except 1/2 tablets on Tuesdays. Recheck INR in 2 weeks.  Call with any questions if ordered new medications or if scheduled for any procedures [phone].

## 2019-08-15 ENCOUNTER — Other Ambulatory Visit: Payer: Self-pay | Admitting: Cardiovascular Disease

## 2019-08-15 MED ORDER — WARFARIN SODIUM 5 MG PO TABS: ORAL_TABLET | ORAL | 0 refills | Status: DC

## 2019-08-15 NOTE — Telephone Encounter (Signed)
° °  Patient states pharmacy gave him 5 pills on 8/28  If Home Health RN is calling please get Coumadin Nurse on the phone STAT  1.  Are you calling in regards to an appointment?NO 2.  Are you calling for a refill ? YES   3.  Are you having bleeding issues? NO  4.  Do you need clearance to hold Coumadin? NO   Please route to the Coumadin Clinic Pool  *STAT* If patient is at the pharmacy, call can be transferred to refill team.   1. Which medications need to be refilled? (please list name of each medication and dose if known) warfarin (COUMADIN) 5 MG tablet 2. Which pharmacy/location (including street and city if local pharmacy) is medication to be sent to?[place] ([place]), [place] - [address] 3. Do they need a 30 day or 90 day supply? [place]

## 2019-08-23 ENCOUNTER — Other Ambulatory Visit: Payer: Self-pay

## 2019-08-23 ENCOUNTER — Ambulatory Visit (INDEPENDENT_AMBULATORY_CARE_PROVIDER_SITE_OTHER): Payer: Self-pay

## 2019-08-23 ENCOUNTER — Encounter: Payer: Self-pay | Admitting: Cardiology

## 2019-08-23 ENCOUNTER — Ambulatory Visit (INDEPENDENT_AMBULATORY_CARE_PROVIDER_SITE_OTHER): Payer: Self-pay | Admitting: Cardiology

## 2019-08-23 VITALS — BP 106/76 | HR 105 | Ht 70.0 in | Wt 234.0 lb

## 2019-08-23 DIAGNOSIS — Z952 Presence of prosthetic heart valve: Secondary | ICD-10-CM

## 2019-08-23 DIAGNOSIS — Z5181 Encounter for therapeutic drug level monitoring: Secondary | ICD-10-CM

## 2019-08-23 DIAGNOSIS — Z79899 Other long term (current) drug therapy: Secondary | ICD-10-CM

## 2019-08-23 DIAGNOSIS — Z7901 Long term (current) use of anticoagulants: Secondary | ICD-10-CM

## 2019-08-23 DIAGNOSIS — R609 Edema, unspecified: Secondary | ICD-10-CM

## 2019-08-23 DIAGNOSIS — F1910 Other psychoactive substance abuse, uncomplicated: Secondary | ICD-10-CM

## 2019-08-23 LAB — POCT INR: INR: 2.3 (ref 2.0–3.0)

## 2019-08-23 MED ORDER — FUROSEMIDE 20 MG PO TABS: 20.0000 mg | ORAL_TABLET | Freq: Every day | ORAL | 1 refills | Status: DC

## 2019-08-23 MED ORDER — METOPROLOL TARTRATE 25 MG PO TABS: 25.0000 mg | ORAL_TABLET | Freq: Two times a day (BID) | ORAL | 12 refills | Status: DC

## 2019-08-23 MED ORDER — WARFARIN SODIUM 5 MG PO TABS: ORAL_TABLET | ORAL | 0 refills | Status: DC

## 2019-08-23 MED ORDER — AMOXICILLIN 500 MG PO TABS: 2000.0000 mg | ORAL_TABLET | ORAL | 4 refills | Status: DC
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
## 2019-08-23 NOTE — Patient Instructions (Addendum)
Medication Instructions:  Your physician recommends that you continue on your current medications as directed. Please refer to the Current Medication list given to you today.  If you need a refill on your cardiac medications before your next appointment, please call your pharmacy.   Lab work: TODAY: BMET & CBC   If you have labs (blood work) drawn today and your tests are completely normal, you will receive your results only by: Marland Kitchen. MyChart Message (if you have MyChart) OR . A paper copy in the mail If you have any lab test that is abnormal or we need to change your treatment, we will call you to review the results.  Testing/Procedures: NONE   Follow-Up: At [facility], you and your health needs are our priority.  As part of our continuing mission to provide you with exceptional heart care, we have created designated Provider Care Teams.  These Care Teams include your primary Cardiologist (physician) and Advanced Practice Providers (APPs -  Physician Assistants and Nurse Practitioners) who all work together to provide you with the care you need, when you need it. You will need a follow up appointment in:  6 months.  Please call our office 2 months in advance to schedule this appointment.  You may see Kristeen MissPhilip [name], MD or one of the following Advanced Practice Providers on your designated Care Team: Tereso NewcomerScott Weaver, PA-C Vin [place] Esperanza[place]hagat, [place] . Berton BonJanine Amahri Dengel, NP  Any Other Special Instructions Will Be Listed Below (If Applicable).  Lifestyle Modifications to Prevent and Treat Heart Disease -Recommend heart healthy/Mediterranean diet, with whole grains, fruits, vegetables, fish, lean meats, nuts, olive oil and avocado oil.  -Limit salt intake to less than 2000 mg per day.  -Recommend moderate walking, starting slowly with a few minutes and working up to 3-5 times/week for 30-50 minutes each session. Aim for at least 150 minutes.week. Goal should be pace of 3 miles/hours, or walking 1.5 miles in  30 minutes -Recommend avoidance of tobacco products. Avoid excess alcohol. -Keep blood pressure well controlled, ideally less than 130/80.     Low-Sodium Eating Plan Sodium, which is an element that makes up salt, helps you maintain a healthy balance of fluids in your body. Too much sodium can increase your blood pressure and cause fluid and waste to be held in your body. Your health care provider or dietitian may recommend following this plan if you have high blood pressure (hypertension), kidney disease, liver disease, or heart failure. Eating less sodium can help lower your blood pressure, reduce swelling, and protect your heart, liver, and kidneys. What are tips for following this plan? General guidelines  Most people on this plan should limit their sodium intake to 1,500-2,000 mg (milligrams) of sodium each day. Reading food labels   The Nutrition Facts label lists the amount of sodium in one serving of the food. If you eat more than one serving, you must multiply the listed amount of sodium by the number of servings.  Choose foods with less than 140 mg of sodium per serving.  Avoid foods with 300 mg of sodium or more per serving. Shopping  Look for lower-sodium products, often labeled as "low-sodium" or "no salt added."  Always check the sodium content even if foods are labeled as "unsalted" or "no salt added".  Buy fresh foods. ? Avoid canned foods and premade or frozen meals. ? Avoid canned, cured, or processed meats  Buy breads that have less than 80 mg of sodium per slice. Cooking  Eat more  home-cooked food and less restaurant, buffet, and fast food.  Avoid adding salt when cooking. Use salt-free seasonings or herbs instead of table salt or sea salt. Check with your health care provider or pharmacist before using salt substitutes.  Cook with plant-based oils, such as canola, sunflower, or olive oil. Meal planning  When eating at a restaurant, ask that your food be  prepared with less salt or no salt, if possible.  Avoid foods that contain MSG (monosodium glutamate). MSG is sometimes added to [place] food, bouillon, and some canned foods. What foods are recommended? The items listed may not be a complete list. Talk with your dietitian about what dietary choices are best for you. Grains Low-sodium cereals, including oats, puffed wheat and rice, and shredded wheat. Low-sodium crackers. Unsalted rice. Unsalted pasta. Low-sodium bread. Whole-grain breads and whole-grain pasta. Vegetables Fresh or frozen vegetables. "No salt added" canned vegetables. "No salt added" tomato sauce and paste. Low-sodium or reduced-sodium tomato and vegetable juice. Fruits Fresh, frozen, or canned fruit. Fruit juice. Meats and other protein foods Fresh or frozen (no salt added) meat, poultry, seafood, and fish. Low-sodium canned tuna and salmon. Unsalted nuts. Dried peas, beans, and lentils without added salt. Unsalted canned beans. Eggs. Unsalted nut butters. Dairy Milk. Soy milk. Cheese that is naturally low in sodium, such as ricotta cheese, fresh mozzarella, or Swiss cheese Low-sodium or reduced-sodium cheese. Cream cheese. Yogurt. Fats and oils Unsalted butter. Unsalted margarine with no trans fat. Vegetable oils such as canola or olive oils. Seasonings and other foods Fresh and dried herbs and spices. Salt-free seasonings. Low-sodium mustard and ketchup. Sodium-free salad dressing. Sodium-free light mayonnaise. Fresh or refrigerated horseradish. Lemon juice. Vinegar. Homemade, reduced-sodium, or low-sodium soups. Unsalted popcorn and pretzels. Low-salt or salt-free chips. What foods are not recommended? The items listed may not be a complete list. Talk with your dietitian about what dietary choices are best for you. Grains Instant hot cereals. Bread stuffing, pancake, and biscuit mixes. Croutons. Seasoned rice or pasta mixes. Noodle soup cups. Boxed or frozen macaroni and  cheese. Regular salted crackers. Self-rising flour. Vegetables Sauerkraut, pickled vegetables, and relishes. Olives. [place] fries. Onion rings. Regular canned vegetables (not low-sodium or reduced-sodium). Regular canned tomato sauce and paste (not low-sodium or reduced-sodium). Regular tomato and vegetable juice (not low-sodium or reduced-sodium). Frozen vegetables in sauces. Meats and other protein foods Meat or fish that is salted, canned, smoked, spiced, or pickled. Bacon, ham, sausage, hotdogs, corned beef, chipped beef, packaged lunch meats, salt pork, jerky, pickled herring, anchovies, regular canned tuna, sardines, salted nuts. Dairy Processed cheese and cheese spreads. Cheese curds. Blue cheese. Feta cheese. String cheese. Regular cottage cheese. Buttermilk. Canned milk. Fats and oils Salted butter. Regular margarine. Ghee. Bacon fat. Seasonings and other foods Onion salt, garlic salt, seasoned salt, table salt, and sea salt. Canned and packaged gravies. Worcestershire sauce. Tartar sauce. Barbecue sauce. Teriyaki sauce. Soy sauce, including reduced-sodium. Steak sauce. Fish sauce. Oyster sauce. Cocktail sauce. Horseradish that you find on the shelf. Regular ketchup and mustard. Meat flavorings and tenderizers. Bouillon cubes. Hot sauce and Tabasco sauce. Premade or packaged marinades. Premade or packaged taco seasonings. Relishes. Regular salad dressings. Salsa. Potato and tortilla chips. Corn chips and puffs. Salted popcorn and pretzels. Canned or dried soups. Pizza. Frozen entrees and pot pies. Summary  Eating less sodium can help lower your blood pressure, reduce swelling, and protect your heart, liver, and kidneys.  Most people on this plan should limit their sodium intake to 1,500-2,000 mg (  milligrams) of sodium each day.  Canned, boxed, and frozen foods are high in sodium. Restaurant foods, fast foods, and pizza are also very high in sodium. You also get sodium by adding salt to  food.  Try to cook at home, eat more fresh fruits and vegetables, and eat less fast food, canned, processed, or prepared foods. This information is not intended to replace advice given to you by your health care provider. Make sure you discuss any questions you have with your health care provider. Document Released: 05/20/2002 Document Revised: 11/10/2017 Document Reviewed: 11/21/2016 Elsevier Patient Education  2020 [employer].

## 2019-08-23 NOTE — Patient Instructions (Signed)
Description   Take 1/2 tablet today, then resume same dosage 1 tablet daily except 1/2 tablets on Tuesdays. Recheck INR in 4 weeks.  Call with any questions if ordered new medications or if scheduled for any procedures [phone].

## 2019-08-24 LAB — BASIC METABOLIC PANEL
BUN/Creatinine Ratio: 14 (ref 9–20)
BUN: 17 mg/dL (ref 6–20)
CO2: 25 mmol/L (ref 20–29)
Calcium: 9.4 mg/dL (ref 8.7–10.2)
Chloride: 104 mmol/L (ref 96–106)
Creatinine, Ser: 1.21 mg/dL (ref 0.76–1.27)
GFR calc Af Amer: 88 mL/min/{1.73_m2} (ref >59)
GFR calc non Af Amer: 76 mL/min/{1.73_m2} (ref >59)
Glucose: 110 mg/dL — ABNORMAL HIGH (ref 65–99)
Potassium: 4 mmol/L (ref 3.5–5.2)
Sodium: 143 mmol/L (ref 134–144)

## 2019-08-24 LAB — CBC
Hematocrit: 38.2 % (ref 37.5–51.0)
Hemoglobin: 12.5 g/dL — ABNORMAL LOW (ref 13.0–17.7)
MCH: 26.5 pg — ABNORMAL LOW (ref 26.6–33.0)
MCHC: 32.7 g/dL (ref 31.5–35.7)
MCV: 81 fL (ref 79–97)
Platelets: 259 10*3/uL (ref 150–450)
RBC: 4.72 x10E6/uL (ref 4.14–5.80)
RDW: 13.9 % (ref 11.6–15.4)
WBC: 7.7 10*3/uL (ref 3.4–10.8)

## 2019-10-01 ENCOUNTER — Other Ambulatory Visit: Payer: Self-pay | Admitting: Cardiovascular Disease

## 2019-10-01 MED ORDER — WARFARIN SODIUM 5 MG PO TABS: ORAL_TABLET | ORAL | 0 refills | Status: DC

## 2019-10-01 NOTE — Telephone Encounter (Signed)
°*  STAT* If patient is at the pharmacy, call can be transferred to refill team.   1. Which medications need to be refilled? (please list name of each medication and dose if known)* Warfarin- need this asap, have not had it for 2 days  2. Which pharmacy/location (including street and city if local pharmacy) is medication to be sent to? [place], [place]*  3. Do they need a 30 day or 90 day supply?  60 days and refills

## 2019-10-01 NOTE — Telephone Encounter (Signed)
Pt is overdue for his Anticoagulation Appt. Called pt and he states that he missed the last 2 days of his Warfarin. Instructed pt that missing doses puts him at risk for having a blood clot and a stroke, also, advised that since he has missed some doses that we will need to give him an extra half tablet today and tomorrow then resume taking normal dose. Pt appt was scheduled for tomorrow, however, since pt has not taken any Warfarin for 2 days, the pt was rescheduled to Monday at 315pm. He is aware that I am sending in enough tabs until appt on Monday, 10/07/2019.

## 2019-10-07 ENCOUNTER — Other Ambulatory Visit: Payer: Self-pay

## 2019-10-07 ENCOUNTER — Ambulatory Visit (INDEPENDENT_AMBULATORY_CARE_PROVIDER_SITE_OTHER): Payer: Self-pay

## 2019-10-07 DIAGNOSIS — Z952 Presence of prosthetic heart valve: Secondary | ICD-10-CM

## 2019-10-07 DIAGNOSIS — Z5181 Encounter for therapeutic drug level monitoring: Secondary | ICD-10-CM

## 2019-10-07 LAB — POCT INR: INR: 1.3 — AB (ref 2.0–3.0)

## 2019-10-07 MED ORDER — WARFARIN SODIUM 5 MG PO TABS: ORAL_TABLET | ORAL | 0 refills | Status: DC

## 2019-10-07 NOTE — Patient Instructions (Signed)
Description   Take 1.5 tablets today, then start taking 1 tablet daily.  Recheck INR in 1 week.  Call with any questions if ordered new medications or if scheduled for any procedures [phone].

## 2019-10-15 ENCOUNTER — Ambulatory Visit (INDEPENDENT_AMBULATORY_CARE_PROVIDER_SITE_OTHER): Payer: Self-pay | Admitting: *Deleted

## 2019-10-15 ENCOUNTER — Other Ambulatory Visit: Payer: Self-pay

## 2019-10-15 DIAGNOSIS — Z5181 Encounter for therapeutic drug level monitoring: Secondary | ICD-10-CM

## 2019-10-15 DIAGNOSIS — Z952 Presence of prosthetic heart valve: Secondary | ICD-10-CM

## 2019-10-15 LAB — POCT INR: INR: 2.6 (ref 2.0–3.0)

## 2019-10-15 NOTE — Patient Instructions (Signed)
Description   Hold today,  then continue taking 1 tablet daily.  Recheck INR in 1 week.  Call with any questions if ordered new medications or if scheduled for any procedures [phone].

## 2019-10-24 ENCOUNTER — Other Ambulatory Visit: Payer: Self-pay | Admitting: Cardiovascular Disease

## 2019-10-24 ENCOUNTER — Other Ambulatory Visit: Payer: Self-pay

## 2019-10-24 ENCOUNTER — Ambulatory Visit (INDEPENDENT_AMBULATORY_CARE_PROVIDER_SITE_OTHER): Payer: Self-pay | Admitting: *Deleted

## 2019-10-24 DIAGNOSIS — Z5181 Encounter for therapeutic drug level monitoring: Secondary | ICD-10-CM

## 2019-10-24 DIAGNOSIS — Z952 Presence of prosthetic heart valve: Secondary | ICD-10-CM

## 2019-10-24 LAB — POCT INR: INR: 1.1 — AB (ref 2.0–3.0)

## 2019-10-24 MED ORDER — WARFARIN SODIUM 5 MG PO TABS: ORAL_TABLET | ORAL | 0 refills | Status: DC

## 2019-10-24 NOTE — Telephone Encounter (Signed)
°*  STAT* If patient is at the pharmacy, call can be transferred to refill team.   1. Which medications need to be refilled? (please list name of each medication and dose if known) warfarin (COUMADIN) 5 MG tablet  2. Which pharmacy/location (including street and city if local pharmacy) is medication to be sent to? [place] ([place]), [place] - [address]  3. Do they need a 30 day or 90 day supply? 30  Patient states that he lost his medication.

## 2019-10-24 NOTE — Patient Instructions (Signed)
Description   Take an extra 1/2 tablet today, then continue taking 1 tablet daily.  Recheck INR in 1 week.  Call with any questions if ordered new medications or if scheduled for any procedures [phone].

## 2019-11-01 ENCOUNTER — Ambulatory Visit (INDEPENDENT_AMBULATORY_CARE_PROVIDER_SITE_OTHER): Payer: Self-pay | Admitting: *Deleted

## 2019-11-01 ENCOUNTER — Other Ambulatory Visit: Payer: Self-pay

## 2019-11-01 DIAGNOSIS — Z5181 Encounter for therapeutic drug level monitoring: Secondary | ICD-10-CM

## 2019-11-01 DIAGNOSIS — Z952 Presence of prosthetic heart valve: Secondary | ICD-10-CM

## 2019-11-01 LAB — POCT INR: INR: 1.7 — AB (ref 2.0–3.0)

## 2019-11-01 MED ORDER — WARFARIN SODIUM 5 MG PO TABS: ORAL_TABLET | ORAL | 0 refills | Status: DC

## 2019-11-01 NOTE — Patient Instructions (Signed)
Description   Continue taking 1 tablet daily.  Recheck INR in 2 weeks.  Call with any questions if ordered new medications or if scheduled for any procedures [phone].

## 2019-11-28 ENCOUNTER — Other Ambulatory Visit: Payer: Self-pay

## 2019-11-28 ENCOUNTER — Ambulatory Visit (INDEPENDENT_AMBULATORY_CARE_PROVIDER_SITE_OTHER): Payer: Self-pay | Admitting: *Deleted

## 2019-11-28 DIAGNOSIS — Z5181 Encounter for therapeutic drug level monitoring: Secondary | ICD-10-CM

## 2019-11-28 DIAGNOSIS — Z952 Presence of prosthetic heart valve: Secondary | ICD-10-CM

## 2019-11-28 LAB — POCT INR: INR: 2 (ref 2.0–3.0)

## 2019-11-28 NOTE — Patient Instructions (Signed)
Description   Continue taking 1 tablet daily.  Recheck INR in 4 weeks.  Call with any questions if ordered new medications or if scheduled for any procedures [phone].

## 2019-12-27 ENCOUNTER — Other Ambulatory Visit: Payer: Self-pay

## 2019-12-27 ENCOUNTER — Ambulatory Visit (INDEPENDENT_AMBULATORY_CARE_PROVIDER_SITE_OTHER): Payer: Self-pay | Admitting: *Deleted

## 2019-12-27 DIAGNOSIS — Z952 Presence of prosthetic heart valve: Secondary | ICD-10-CM

## 2019-12-27 DIAGNOSIS — Z5181 Encounter for therapeutic drug level monitoring: Secondary | ICD-10-CM

## 2019-12-27 LAB — POCT INR: INR: 1.4 — AB (ref 2.0–3.0)

## 2019-12-27 MED ORDER — WARFARIN SODIUM 5 MG PO TABS: ORAL_TABLET | ORAL | 1 refills | Status: DC

## 2019-12-27 NOTE — Patient Instructions (Signed)
Description   Today take 1.5 tablets then continue taking 1 tablet daily.  Recheck INR in 4 weeks.  Call with any questions if ordered new medications or if scheduled for any procedures [phone].

## 2020-01-24 ENCOUNTER — Other Ambulatory Visit: Payer: Self-pay

## 2020-01-24 ENCOUNTER — Ambulatory Visit (INDEPENDENT_AMBULATORY_CARE_PROVIDER_SITE_OTHER): Payer: Self-pay

## 2020-01-24 DIAGNOSIS — Z5181 Encounter for therapeutic drug level monitoring: Secondary | ICD-10-CM

## 2020-01-24 DIAGNOSIS — Z952 Presence of prosthetic heart valve: Secondary | ICD-10-CM

## 2020-01-24 LAB — POCT INR: INR: 1.6 — AB (ref 2.0–3.0)

## 2020-01-24 MED ORDER — WARFARIN SODIUM 5 MG PO TABS: ORAL_TABLET | ORAL | 1 refills | Status: DC

## 2020-01-24 NOTE — Patient Instructions (Signed)
Description   Continue on same dosage 1 tablet daily.  Recheck INR in 4 weeks.  Call with any questions if ordered new medications or if scheduled for any procedures [phone].

## 2020-02-25 ENCOUNTER — Other Ambulatory Visit: Payer: Self-pay | Admitting: Cardiology

## 2020-02-25 NOTE — Telephone Encounter (Signed)
  *  STAT* If patient is at the pharmacy, call can be transferred to refill team.   1. Which medications need to be refilled? (please list name of each medication and dose if known) amoxicillin (AMOXIL) 500 MG tablet  2. Which pharmacy/location (including street and city if local pharmacy) is medication to be sent to? [facility] - [place] ([place]), [place] - [address]  3. Do they need a 30 day or 90 day supply? 10 days

## 2020-02-25 NOTE — Telephone Encounter (Signed)
For a dental procedure.

## 2020-02-25 NOTE — Telephone Encounter (Signed)
I spoke with [facility]. They have active prescription with refills and will get medication ready for patient

## 2020-02-27 ENCOUNTER — Other Ambulatory Visit: Payer: Self-pay

## 2020-02-27 ENCOUNTER — Ambulatory Visit (INDEPENDENT_AMBULATORY_CARE_PROVIDER_SITE_OTHER): Payer: Self-pay | Admitting: *Deleted

## 2020-02-27 DIAGNOSIS — Z952 Presence of prosthetic heart valve: Secondary | ICD-10-CM

## 2020-02-27 DIAGNOSIS — Z5181 Encounter for therapeutic drug level monitoring: Secondary | ICD-10-CM

## 2020-02-27 LAB — POCT INR: INR: 1.3 — AB (ref 2.0–3.0)

## 2020-02-27 NOTE — Patient Instructions (Signed)
Description   Today take 1.5 tablets then continue on same dosage 1 tablet daily.  Recheck INR in 4 weeks.  Call with any questions if ordered new medications or if scheduled for any procedures [phone].

## 2020-03-10 ENCOUNTER — Other Ambulatory Visit: Payer: Self-pay | Admitting: *Deleted

## 2020-03-10 ENCOUNTER — Other Ambulatory Visit: Payer: Self-pay

## 2020-03-10 MED ORDER — AMOXICILLIN 500 MG PO TABS: 2000.0000 mg | ORAL_TABLET | ORAL | 4 refills | Status: DC

## 2020-03-10 NOTE — Telephone Encounter (Signed)
Pt calling requesting a refill on amoxicillin, because pt has an dental procedure today and would like a refill sent to his pharmacy ASAP. Please address

## 2020-03-18 ENCOUNTER — Other Ambulatory Visit: Payer: Self-pay | Admitting: Cardiovascular Disease

## 2020-03-18 NOTE — Telephone Encounter (Signed)
The patient reports he went to the dentist and needs a root canal. He was instructed by his dentist to call Cardiology to get a weeks worth of amoxicillin to treat his infection prior to pulling. Reiterated to the patient that Cardiology pre-treats for dental procedures, but does not treat active infections and his dentist or a PCP will need to treat his infection. He was grateful for call.

## 2020-03-18 NOTE — Telephone Encounter (Signed)
New Message   *STAT* If patient is at the pharmacy, call can be transferred to refill team.   1. Which medications need to be refilled? (please list name of each medication and dose if known) amoxicillin (AMOXIL) 500 MG tablet  2. Which pharmacy/location (including street and city if local pharmacy) is medication to be sent to? [facility] - [place] ([place]), [place] - [address]  3. Do they need a 30 day or 90 day supply? 30 day per patient   Patient is wanting a call back to confirm.

## 2020-03-18 NOTE — Telephone Encounter (Signed)
Called pt and pt stated that he needed more amoxicilin medication for at lease a week because he has an infection and he can not have dental procedure until the infection is gone. Pt would like a call back concerning this matter. Please address

## 2020-04-13 ENCOUNTER — Encounter: Payer: Self-pay | Admitting: Cardiovascular Disease

## 2020-04-13 NOTE — Progress Notes (Signed)
No show

## 2020-04-14 ENCOUNTER — Ambulatory Visit (INDEPENDENT_AMBULATORY_CARE_PROVIDER_SITE_OTHER): Payer: Self-pay | Admitting: Cardiovascular Disease

## 2020-04-14 DIAGNOSIS — Z91199 Patient's noncompliance with other medical treatment and regimen due to unspecified reason: Secondary | ICD-10-CM | POA: Insufficient documentation

## 2020-04-14 DIAGNOSIS — Z5329 Procedure and treatment not carried out because of patient's decision for other reasons: Secondary | ICD-10-CM

## 2020-04-27 ENCOUNTER — Telehealth: Payer: Self-pay | Admitting: Cardiovascular Disease

## 2020-04-27 MED ORDER — WARFARIN SODIUM 5 MG PO TABS: ORAL_TABLET | ORAL | 0 refills | Status: DC

## 2020-04-27 NOTE — Telephone Encounter (Signed)
New message    Pt c/o medication issue:  1. Name of Medication:warfarin (COUMADIN) 5 MG tablet  2. How are you currently taking this medication (dosage and times per day)? As written  3. Are you having a reaction (difficulty breathing--STAT)? No   4. What is your medication issue? Patient needs a new prescription for this medication sent to [facility] - [place] ([place]), [place] - [address]

## 2020-04-27 NOTE — Telephone Encounter (Signed)
Pt is overdue for a INR check. Pt has an appointment for tomorrow to get INR checked. Pt stated that he dose not have any warfarin to take tonight. Will send in refill.

## 2020-04-28 ENCOUNTER — Ambulatory Visit (INDEPENDENT_AMBULATORY_CARE_PROVIDER_SITE_OTHER): Payer: Self-pay | Admitting: *Deleted

## 2020-04-28 ENCOUNTER — Other Ambulatory Visit: Payer: Self-pay

## 2020-04-28 DIAGNOSIS — Z952 Presence of prosthetic heart valve: Secondary | ICD-10-CM

## 2020-04-28 DIAGNOSIS — Z5181 Encounter for therapeutic drug level monitoring: Secondary | ICD-10-CM

## 2020-04-28 LAB — POCT INR: INR: 1.3 — AB (ref 2.0–3.0)

## 2020-04-28 NOTE — Patient Instructions (Signed)
Description   Today take 1.5 tablets of Warfarin then start 1 tablet daily except 1.5 tablets on Sundays.  Recheck INR in 2 weeks.  Call with any questions if ordered new medications or if scheduled for any procedures [phone].

## 2020-05-12 ENCOUNTER — Ambulatory Visit: Payer: Self-pay | Admitting: Cardiovascular Disease

## 2020-06-19 ENCOUNTER — Other Ambulatory Visit: Payer: Self-pay

## 2020-06-19 ENCOUNTER — Ambulatory Visit (INDEPENDENT_AMBULATORY_CARE_PROVIDER_SITE_OTHER): Payer: Self-pay

## 2020-06-19 DIAGNOSIS — Z952 Presence of prosthetic heart valve: Secondary | ICD-10-CM

## 2020-06-19 DIAGNOSIS — Z5181 Encounter for therapeutic drug level monitoring: Secondary | ICD-10-CM

## 2020-06-19 LAB — POCT INR: INR: 1.3 — AB (ref 2.0–3.0)

## 2020-06-19 MED ORDER — WARFARIN SODIUM 5 MG PO TABS: ORAL_TABLET | ORAL | 0 refills | Status: DC

## 2020-06-19 NOTE — Patient Instructions (Signed)
Today take 1.5 tablets today and then continue taking 1 tablet daily  except 1.5 tablets on Sundays.  Recheck INR in 4 weeks (per pt).  Call with any questions if ordered new medications or if scheduled for any procedures [phone].

## 2020-07-17 ENCOUNTER — Other Ambulatory Visit: Payer: Self-pay

## 2020-07-17 ENCOUNTER — Ambulatory Visit (INDEPENDENT_AMBULATORY_CARE_PROVIDER_SITE_OTHER): Payer: Self-pay

## 2020-07-17 DIAGNOSIS — Z952 Presence of prosthetic heart valve: Secondary | ICD-10-CM

## 2020-07-17 DIAGNOSIS — Z5181 Encounter for therapeutic drug level monitoring: Secondary | ICD-10-CM

## 2020-07-17 LAB — POCT INR: INR: 1.3 — AB (ref 2.0–3.0)

## 2020-07-17 MED ORDER — WARFARIN SODIUM 5 MG PO TABS: ORAL_TABLET | ORAL | 0 refills | Status: DC

## 2020-07-17 NOTE — Patient Instructions (Signed)
Description   Take 1.5 tablets today and then start taking 1 tablet daily except 1.5 tablets on Sundays and Thursdays.  Recheck INR in 4 weeks (per pt).  Call with any questions if ordered new medications or if scheduled for any procedures [phone].

## 2020-07-24 ENCOUNTER — Ambulatory Visit (INDEPENDENT_AMBULATORY_CARE_PROVIDER_SITE_OTHER): Payer: Self-pay | Admitting: Pharmacist

## 2020-07-24 ENCOUNTER — Other Ambulatory Visit: Payer: Self-pay

## 2020-07-24 ENCOUNTER — Encounter: Payer: Self-pay | Admitting: Cardiovascular Disease

## 2020-07-24 ENCOUNTER — Ambulatory Visit (INDEPENDENT_AMBULATORY_CARE_PROVIDER_SITE_OTHER): Payer: Self-pay | Admitting: Cardiovascular Disease

## 2020-07-24 VITALS — BP 124/88 | HR 73 | Ht 70.0 in | Wt 224.6 lb

## 2020-07-24 DIAGNOSIS — Z952 Presence of prosthetic heart valve: Secondary | ICD-10-CM

## 2020-07-24 DIAGNOSIS — Z5181 Encounter for therapeutic drug level monitoring: Secondary | ICD-10-CM

## 2020-07-24 LAB — POCT INR
INR: 1.5 — AB (ref 2.0–3.0)
INR: 2.2 (ref 2.0–3.0)

## 2020-07-24 NOTE — Patient Instructions (Signed)
Medication Instructions:  Your provider recommends that you continue on your current medications as directed. Please refer to the Current Medication list given to you today.   *If you need a refill on your cardiac medications before your next appointment, please call your pharmacy*  Lab Work: Your provider recommends that you return for lab work in 1 WEEK (you do not need to be fasting) If you have labs (blood work) drawn today and your tests are completely normal, you will receive your results only by: Marland Kitchen MyChart Message (if you have MyChart) OR . A paper copy in the mail If you have any lab test that is abnormal or we need to change your treatment, we will call you to review the results.  Follow-Up: At [facility], you and your health needs are our priority.  As part of our continuing mission to provide you with exceptional heart care, we have created designated Provider Care Teams.  These Care Teams include your primary Cardiologist (physician) and Advanced Practice Providers (APPs -  Physician Assistants and Nurse Practitioners) who all work together to provide you with the care you need, when you need it. Your next appointment:   12 month(s) The format for your next appointment:   In Person Provider:   You may see Kristeen Miss, MD or one of the following Advanced Practice Providers on your designated Care Team:    Tereso Newcomer, PA-C  Vin [place], [place]
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
## 2020-07-24 NOTE — Patient Instructions (Addendum)
Description   Take 1 tablet daily except 1.5 tablets on Sundays and Thursdays.  Recheck INR in [facility] at [facility] with [facility] Elease Hashimoto.  Call with any questions if ordered new medications or if scheduled for any procedures [phone].

## 2020-07-27 ENCOUNTER — Other Ambulatory Visit: Payer: Medicaid Other

## 2020-08-07 ENCOUNTER — Other Ambulatory Visit: Payer: Self-pay

## 2020-08-07 ENCOUNTER — Encounter (HOSPITAL_COMMUNITY): Payer: Self-pay

## 2020-08-07 ENCOUNTER — Emergency Department (HOSPITAL_COMMUNITY)
Admission: EM | Admit: 2020-08-07 | Discharge: 2020-08-07 | Disposition: A | Discharge status: Home / Self Care | Payer: Self-pay | Attending: Emergency Medicine | Admitting: Emergency Medicine

## 2020-08-07 ENCOUNTER — Emergency Department (HOSPITAL_COMMUNITY): Payer: Self-pay

## 2020-08-07 DIAGNOSIS — Y929 Unspecified place or not applicable: Secondary | ICD-10-CM | POA: Insufficient documentation

## 2020-08-07 DIAGNOSIS — S61213A Laceration without foreign body of left middle finger without damage to nail, initial encounter: Secondary | ICD-10-CM | POA: Insufficient documentation

## 2020-08-07 DIAGNOSIS — W312XXA Contact with powered woodworking and forming machines, initial encounter: Secondary | ICD-10-CM | POA: Insufficient documentation

## 2020-08-07 DIAGNOSIS — Y999 Unspecified external cause status: Secondary | ICD-10-CM | POA: Insufficient documentation

## 2020-08-07 DIAGNOSIS — Y9389 Activity, other specified: Secondary | ICD-10-CM | POA: Insufficient documentation

## 2020-08-07 DIAGNOSIS — Z5321 Procedure and treatment not carried out due to patient leaving prior to being seen by health care provider: Secondary | ICD-10-CM | POA: Insufficient documentation

## 2020-08-07 NOTE — ED Notes (Signed)
Pt called for vitals with no response x1 

## 2020-08-07 NOTE — ED Triage Notes (Signed)
Pt states that he was working and cut his L middle finger with a saw, bleeding controlled. Last tetanus a year ago

## 2020-08-07 NOTE — ED Notes (Signed)
Pt witnessed leaving lobby.

## 2020-08-20 ENCOUNTER — Ambulatory Visit (INDEPENDENT_AMBULATORY_CARE_PROVIDER_SITE_OTHER): Payer: Self-pay | Admitting: Pharmacist

## 2020-08-20 ENCOUNTER — Other Ambulatory Visit: Payer: Self-pay

## 2020-08-20 DIAGNOSIS — Z952 Presence of prosthetic heart valve: Secondary | ICD-10-CM

## 2020-08-20 DIAGNOSIS — Z5181 Encounter for therapeutic drug level monitoring: Secondary | ICD-10-CM

## 2020-08-20 LAB — POCT INR: INR: 1.4 — AB (ref 2.0–3.0)

## 2020-08-20 NOTE — Patient Instructions (Addendum)
Take 2 tablets today then continue taking 1 tablet daily except 1.5 tablets on Sundays and Thursdays.  Recheck INR in 2 weeks.  Call with any questions if ordered new medications or if scheduled for any procedures [phone].

## 2020-09-02 ENCOUNTER — Other Ambulatory Visit: Payer: Self-pay

## 2020-09-02 ENCOUNTER — Ambulatory Visit (INDEPENDENT_AMBULATORY_CARE_PROVIDER_SITE_OTHER): Payer: Self-pay | Admitting: *Deleted

## 2020-09-02 DIAGNOSIS — Z5181 Encounter for therapeutic drug level monitoring: Secondary | ICD-10-CM

## 2020-09-02 DIAGNOSIS — Z952 Presence of prosthetic heart valve: Secondary | ICD-10-CM

## 2020-09-02 LAB — POCT INR: INR: 1.1 — AB (ref 2.0–3.0)

## 2020-09-02 MED ORDER — METOPROLOL TARTRATE 25 MG PO TABS
25.0000 mg | ORAL_TABLET | Freq: Two times a day (BID) | ORAL | 11 refills | Status: DC
Start: 2020-09-02 — End: 2021-11-09

## 2020-09-02 NOTE — Patient Instructions (Addendum)
Description   Take 1.5  tablets of warfarin today then start taking 1 tablet daily except 1.5 tablets on Sundays, Tuesdays and Thursdays.  Recheck INR in 1 week.  Call with any questions if ordered new medications or if scheduled for any procedures [phone].

## 2020-09-14 ENCOUNTER — Other Ambulatory Visit: Payer: Self-pay

## 2020-09-14 ENCOUNTER — Ambulatory Visit (INDEPENDENT_AMBULATORY_CARE_PROVIDER_SITE_OTHER): Payer: Self-pay | Admitting: *Deleted

## 2020-09-14 DIAGNOSIS — Z5181 Encounter for therapeutic drug level monitoring: Secondary | ICD-10-CM

## 2020-09-14 DIAGNOSIS — Z952 Presence of prosthetic heart valve: Secondary | ICD-10-CM

## 2020-09-14 LAB — POCT INR: INR: 1.4 — AB (ref 2.0–3.0)

## 2020-09-14 NOTE — Patient Instructions (Signed)
Description   Take 1.5 tablets of warfarin today then start taking 1.5 tablets daily except 1 tablet on Mondays, Wednesdays, and Fridays. Recheck INR in 2 weeks.  Call with any questions if ordered new medications or if scheduled for any procedures [phone].

## 2020-09-25 ENCOUNTER — Ambulatory Visit (INDEPENDENT_AMBULATORY_CARE_PROVIDER_SITE_OTHER): Payer: Self-pay | Admitting: *Deleted

## 2020-09-25 ENCOUNTER — Other Ambulatory Visit: Payer: Self-pay

## 2020-09-25 DIAGNOSIS — Z5181 Encounter for therapeutic drug level monitoring: Secondary | ICD-10-CM

## 2020-09-25 DIAGNOSIS — Z952 Presence of prosthetic heart valve: Secondary | ICD-10-CM

## 2020-09-25 LAB — POCT INR: INR: 2.8 (ref 2.0–3.0)

## 2020-09-25 MED ORDER — WARFARIN SODIUM 5 MG PO TABS: ORAL_TABLET | ORAL | 0 refills | Status: DC

## 2020-09-25 NOTE — Patient Instructions (Addendum)
Description   Hold today then start taking the dose you were suppose to be taking 1.5 tablets daily except 1 tablet on Mondays, Wednesdays, and Fridays. Recheck INR in 2 weeks.  Call with any questions if ordered new medications or if scheduled for any procedures [phone].

## 2020-10-09 ENCOUNTER — Other Ambulatory Visit: Payer: Self-pay

## 2020-10-09 ENCOUNTER — Ambulatory Visit (INDEPENDENT_AMBULATORY_CARE_PROVIDER_SITE_OTHER): Payer: Self-pay | Admitting: *Deleted

## 2020-10-09 DIAGNOSIS — Z952 Presence of prosthetic heart valve: Secondary | ICD-10-CM

## 2020-10-09 DIAGNOSIS — Z5181 Encounter for therapeutic drug level monitoring: Secondary | ICD-10-CM

## 2020-10-09 LAB — POCT INR: INR: 1.3 — AB (ref 2.0–3.0)

## 2020-10-09 NOTE — Patient Instructions (Signed)
Description   Today take an extra 1/2 tablet the resume taking the dose you were suppose to be taking 1.5 tablets daily 1 tablet on Mondays, Wednesdays, and Fridays. Recheck INR in 2 weeks.  Call with any questions if ordered new medications or if scheduled for any procedures [phone].

## 2020-10-23 ENCOUNTER — Other Ambulatory Visit: Payer: Self-pay

## 2020-10-23 ENCOUNTER — Ambulatory Visit (INDEPENDENT_AMBULATORY_CARE_PROVIDER_SITE_OTHER): Payer: Self-pay | Admitting: *Deleted

## 2020-10-23 DIAGNOSIS — Z952 Presence of prosthetic heart valve: Secondary | ICD-10-CM

## 2020-10-23 DIAGNOSIS — Z5181 Encounter for therapeutic drug level monitoring: Secondary | ICD-10-CM

## 2020-10-23 LAB — POCT INR: INR: 2.3 (ref 2.0–3.0)

## 2020-10-23 NOTE — Patient Instructions (Signed)
Description   Tomorrow take 1/2 tablet then continue taking 1.5 tablets daily 1 tablet on Mondays, Wednesdays, and Fridays. Recheck INR in 3 weeks.  Call with any questions if ordered new medications or if scheduled for any procedures [phone].

## 2020-11-13 ENCOUNTER — Telehealth: Payer: Self-pay | Admitting: *Deleted

## 2020-11-13 NOTE — Telephone Encounter (Signed)
Left message for pt to call back to reschedule Anticoagulation Appt that he missed today.

## 2020-11-17 ENCOUNTER — Other Ambulatory Visit: Payer: Self-pay | Admitting: Cardiovascular Disease

## 2020-11-18 MED ORDER — WARFARIN SODIUM 5 MG PO TABS: ORAL_TABLET | ORAL | 0 refills | Status: DC

## 2020-11-18 NOTE — Telephone Encounter (Addendum)
Overdue for INR Check. Called pt and LMOM.

## 2020-11-18 NOTE — Telephone Encounter (Signed)
Prescription for warfarin sent.

## 2020-11-18 NOTE — Telephone Encounter (Signed)
Called and spoke to pt who stated that he is out of medication and has not had any warfarin in 2 days. Rescheduled pt to come in on 12/15. Gave pt the number to the coumadin clinic. [phone].

## 2020-11-18 NOTE — Addendum Note (Signed)
Addended by: Mellody Dance B on: 11/18/2020 11:00 AM   Modules accepted: Orders

## 2020-11-18 NOTE — Telephone Encounter (Signed)
Pt has appointment to see coumadin clinic 12/10

## 2020-11-18 NOTE — Telephone Encounter (Signed)
Follow up:    Patient calling to check status os medication, however patient had apt around 4 month ago. Please call patient back.

## 2020-12-09 ENCOUNTER — Telehealth: Payer: Self-pay | Admitting: *Deleted

## 2020-12-09 NOTE — Telephone Encounter (Signed)
Called the pt since he missed his Anticoagulation Appt today, which he made yesterday. Left a message for the pt to call back. Will await a response.

## 2021-01-01 ENCOUNTER — Ambulatory Visit (INDEPENDENT_AMBULATORY_CARE_PROVIDER_SITE_OTHER): Payer: Self-pay | Admitting: *Deleted

## 2021-01-01 ENCOUNTER — Other Ambulatory Visit: Payer: Self-pay

## 2021-01-01 DIAGNOSIS — Z952 Presence of prosthetic heart valve: Secondary | ICD-10-CM

## 2021-01-01 DIAGNOSIS — Z5181 Encounter for therapeutic drug level monitoring: Secondary | ICD-10-CM

## 2021-01-01 LAB — POCT INR: INR: 1.1 — AB (ref 2.0–3.0)

## 2021-01-01 MED ORDER — WARFARIN SODIUM 5 MG PO TABS: ORAL_TABLET | ORAL | 0 refills | Status: DC

## 2021-01-01 NOTE — Patient Instructions (Signed)
Description   ONYX VALVE 1.5-2.0: Today take another 1/2 tablet and tomorrow take 2 tablets then continue taking 1.5 tablets daily 1 tablet on Mondays, Wednesdays, and Fridays. Recheck INR in 1 week.  Call with any questions if ordered new medications or if scheduled for any procedures [phone].

## 2021-01-13 ENCOUNTER — Telehealth: Payer: Self-pay | Admitting: *Deleted

## 2021-01-13 NOTE — Telephone Encounter (Signed)
Pt missed today's appt; called and left message for the pt to call back to reschedule.

## 2021-01-29 ENCOUNTER — Telehealth: Payer: Self-pay | Admitting: *Deleted

## 2021-01-29 NOTE — Telephone Encounter (Signed)
Pt is overdue for Anticoagulation Appt; called the pt but there was no answer so left a message.

## 2021-02-11 ENCOUNTER — Other Ambulatory Visit: Payer: Self-pay

## 2021-02-11 ENCOUNTER — Ambulatory Visit (INDEPENDENT_AMBULATORY_CARE_PROVIDER_SITE_OTHER): Payer: Self-pay | Admitting: *Deleted

## 2021-02-11 DIAGNOSIS — Z952 Presence of prosthetic heart valve: Secondary | ICD-10-CM

## 2021-02-11 DIAGNOSIS — Z5181 Encounter for therapeutic drug level monitoring: Secondary | ICD-10-CM

## 2021-02-11 LAB — POCT INR: INR: 1.5 — AB (ref 2.0–3.0)

## 2021-02-11 MED ORDER — WARFARIN SODIUM 5 MG PO TABS: ORAL_TABLET | ORAL | 0 refills | Status: DC

## 2021-02-11 NOTE — Patient Instructions (Signed)
Description   ONYX VALVE 1.5-2.0: Continue taking 1.5 tablets daily 1 tablet on Mondays, Wednesdays, and Fridays. Recheck INR in 4 weeks. Call with any questions if ordered new medications or if scheduled for any procedures [phone].

## 2021-03-16 ENCOUNTER — Other Ambulatory Visit: Payer: Self-pay

## 2021-03-16 ENCOUNTER — Ambulatory Visit (INDEPENDENT_AMBULATORY_CARE_PROVIDER_SITE_OTHER): Payer: Self-pay

## 2021-03-16 DIAGNOSIS — Z952 Presence of prosthetic heart valve: Secondary | ICD-10-CM

## 2021-03-16 DIAGNOSIS — Z5181 Encounter for therapeutic drug level monitoring: Secondary | ICD-10-CM

## 2021-03-16 LAB — POCT INR: INR: 1.9 — AB (ref 2.0–3.0)

## 2021-03-16 NOTE — Patient Instructions (Signed)
Description   ONYX VALVE 1.5-2.0: Continue taking 1.5 tablets daily 1 tablet on Mondays, Wednesdays, and Fridays. Recheck INR in 4 weeks. Call with any questions if ordered new medications or if scheduled for any procedures [phone].

## 2021-03-16 NOTE — Telephone Encounter (Signed)
Pt seen in Coumadin Clinic today requesting refill of Lasix please to [facility] [place].

## 2021-03-17 MED ORDER — FUROSEMIDE 20 MG PO TABS: 20.0000 mg | ORAL_TABLET | Freq: Every day | ORAL | 3 refills | Status: DC

## 2021-04-19 ENCOUNTER — Ambulatory Visit (INDEPENDENT_AMBULATORY_CARE_PROVIDER_SITE_OTHER): Payer: Self-pay

## 2021-04-19 ENCOUNTER — Other Ambulatory Visit: Payer: Self-pay

## 2021-04-19 DIAGNOSIS — Z5181 Encounter for therapeutic drug level monitoring: Secondary | ICD-10-CM

## 2021-04-19 DIAGNOSIS — Z952 Presence of prosthetic heart valve: Secondary | ICD-10-CM

## 2021-04-19 LAB — POCT INR: INR: 1.5 — AB (ref 2.0–3.0)

## 2021-04-19 NOTE — Patient Instructions (Signed)
ONYX VALVE 1.5-2.0: Continue taking 1.5 tablets daily 1 tablet on Mondays, Wednesdays, and Fridays. Recheck INR in 5 weeks. Call with any questions if ordered new medications or if scheduled for any procedures [phone].

## 2021-05-17 ENCOUNTER — Other Ambulatory Visit: Payer: Self-pay | Admitting: Cardiovascular Disease

## 2021-05-17 NOTE — Telephone Encounter (Signed)
Prescription refill request received for warfarin Lov: [name] 07/24/2020 Next INR check: 05/24/2021 Warfarin tablet strength: 5mg 

## 2021-06-17 ENCOUNTER — Telehealth: Payer: Self-pay | Admitting: *Deleted

## 2021-06-17 NOTE — Telephone Encounter (Signed)
Pt is overdue and multiple attempts have been made to reach the pt per INR reminder list notes. Called pt today and left another message for the pt to call back to reschedule.

## 2021-07-01 ENCOUNTER — Telehealth: Payer: Self-pay | Admitting: *Deleted

## 2021-07-01 NOTE — Telephone Encounter (Signed)
Called pt since he is overdue for an INR appt; left a message for the pt to call back or let us know if someone else is managing his INR. Will await a call back.

## 2021-07-09 ENCOUNTER — Telehealth: Payer: Self-pay

## 2021-07-09 NOTE — Telephone Encounter (Signed)
Pt overdue for INR check. Called pt and left message.

## 2021-07-12 NOTE — Telephone Encounter (Signed)
Pt missed appt for INR check today. Left message for to call back to r/s.

## 2021-07-28 ENCOUNTER — Telehealth: Payer: Self-pay | Admitting: *Deleted

## 2021-07-28 NOTE — Telephone Encounter (Signed)
Pt is overdue for INR check; called pt and left a message for the pt to call back regarding an appt.

## 2021-08-05 ENCOUNTER — Telehealth: Payer: Self-pay | Admitting: *Deleted

## 2021-08-05 MED ORDER — WARFARIN SODIUM 5 MG PO TABS: ORAL_TABLET | ORAL | 0 refills | Status: DC

## 2021-08-05 NOTE — Telephone Encounter (Signed)
Pt overdue and has an appt set for Monday that he did not make with anyone and was unaware he was seeing the Anticoagulation Clinic. He is out of Warfarin and a 4 day supply was sent at this time. Advised pt to come at 315pm for appt then he will be taken to Dr. Elease Hashimoto for that appt. He verbalized understanding. Also, pt will take 2 tablets today and 1.5 tablets tomorrow then resume normal dose.

## 2021-08-08 ENCOUNTER — Encounter: Payer: Self-pay | Admitting: Cardiovascular Disease
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
## 2021-08-09 ENCOUNTER — Ambulatory Visit (INDEPENDENT_AMBULATORY_CARE_PROVIDER_SITE_OTHER): Payer: Self-pay

## 2021-08-09 ENCOUNTER — Other Ambulatory Visit: Payer: Self-pay

## 2021-08-09 ENCOUNTER — Ambulatory Visit (INDEPENDENT_AMBULATORY_CARE_PROVIDER_SITE_OTHER): Payer: Self-pay | Admitting: Cardiovascular Disease

## 2021-08-09 DIAGNOSIS — F1910 Other psychoactive substance abuse, uncomplicated: Secondary | ICD-10-CM

## 2021-08-09 DIAGNOSIS — I330 Acute and subacute infective endocarditis: Secondary | ICD-10-CM

## 2021-08-09 DIAGNOSIS — Z952 Presence of prosthetic heart valve: Secondary | ICD-10-CM

## 2021-08-09 DIAGNOSIS — B958 Unspecified staphylococcus as the cause of diseases classified elsewhere: Secondary | ICD-10-CM

## 2021-08-09 DIAGNOSIS — Z5181 Encounter for therapeutic drug level monitoring: Secondary | ICD-10-CM

## 2021-08-09 LAB — POCT INR: INR: 1.1 — AB (ref 2.0–3.0)

## 2021-08-09 MED ORDER — WARFARIN SODIUM 5 MG PO TABS: ORAL_TABLET | ORAL | 0 refills | Status: DC

## 2021-08-09 NOTE — Telephone Encounter (Addendum)
Prescription refill request received for warfarin Lov: 07/24/20 (appt scheduled for 8/29, pt canceled)  Next INR check: 08/17/21 Warfarin tablet strength: 5mg   Appropriate dose and refill sent to requested pharmacy.

## 2021-08-09 NOTE — Patient Instructions (Signed)
Description   ONYX VALVE 1.5-2.0: Take 2 tablets today and 2 tablets tomorrow and then continue taking 1.5 tablets daily 1 tablet on Mondays, Wednesdays, and Fridays. Recheck INR in 1 week. Call with any questions if ordered new medications or if scheduled for any procedures [phone].

## 2021-08-11 ENCOUNTER — Other Ambulatory Visit: Payer: Self-pay | Admitting: Cardiovascular Disease

## 2021-08-11 NOTE — Telephone Encounter (Signed)
Warfarin was refilled at [facility] on 08/09/21, 40 tablets, 30 day supply.  Pt is non-compliant, early refills not appropriate.

## 2021-08-18 ENCOUNTER — Other Ambulatory Visit: Payer: Self-pay

## 2021-08-18 MED ORDER — FUROSEMIDE 20 MG PO TABS: 20.0000 mg | ORAL_TABLET | Freq: Every day | ORAL | 3 refills | Status: AC

## 2021-08-20 ENCOUNTER — Telehealth: Payer: Self-pay | Admitting: *Deleted

## 2021-08-20 NOTE — Telephone Encounter (Signed)
Called the pt since he is overdue for INR monitoring; left a message for him to call back & get rescheduled.

## 2021-08-24 ENCOUNTER — Telehealth: Payer: Self-pay | Admitting: *Deleted

## 2021-08-24 NOTE — Telephone Encounter (Signed)
Called pt since he is overdue for Anticoagulation Monitoring Appt; there was no answer, therefore, left a message for the pt to call back.

## 2021-08-30 ENCOUNTER — Telehealth: Payer: Self-pay

## 2021-09-02 ENCOUNTER — Telehealth: Payer: Self-pay | Admitting: *Deleted

## 2021-09-02 NOTE — Telephone Encounter (Signed)
Called pt since he is overdue for Anticoagulation Appt; left a message for the pt to call back.  

## 2021-09-07 ENCOUNTER — Telehealth: Payer: Self-pay | Admitting: *Deleted

## 2021-09-07 NOTE — Telephone Encounter (Signed)
Called pt since he is overdue for his Anticoagulation Appt; left a message for the pt to call back to get rescheduled.

## 2021-09-07 NOTE — Telephone Encounter (Signed)
Called pt, left message.

## 2021-10-06 ENCOUNTER — Telehealth: Payer: Self-pay

## 2021-10-06 NOTE — Telephone Encounter (Signed)
Pt INR overdue. Called, no answer. LMOM  

## 2021-10-10 ENCOUNTER — Encounter: Payer: Self-pay | Admitting: Cardiovascular Disease
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
## 2021-10-11 ENCOUNTER — Other Ambulatory Visit: Payer: Self-pay

## 2021-10-11 ENCOUNTER — Encounter: Payer: Self-pay | Admitting: Cardiovascular Disease

## 2021-10-11 ENCOUNTER — Ambulatory Visit (INDEPENDENT_AMBULATORY_CARE_PROVIDER_SITE_OTHER): Payer: Self-pay | Admitting: *Deleted

## 2021-10-11 ENCOUNTER — Ambulatory Visit (INDEPENDENT_AMBULATORY_CARE_PROVIDER_SITE_OTHER): Payer: Self-pay | Admitting: Cardiovascular Disease

## 2021-10-11 VITALS — BP 132/82 | HR 76 | Ht 68.0 in | Wt 234.2 lb

## 2021-10-11 DIAGNOSIS — Z5181 Encounter for therapeutic drug level monitoring: Secondary | ICD-10-CM

## 2021-10-11 DIAGNOSIS — I330 Acute and subacute infective endocarditis: Secondary | ICD-10-CM

## 2021-10-11 DIAGNOSIS — Z952 Presence of prosthetic heart valve: Secondary | ICD-10-CM

## 2021-10-11 LAB — POCT INR: INR: 1.6 — AB (ref 2.0–3.0)

## 2021-10-11 MED ORDER — SILDENAFIL CITRATE 50 MG PO TABS: 50.0000 mg | ORAL_TABLET | Freq: Every day | ORAL | 6 refills | Status: DC | PRN

## 2021-10-11 MED ORDER — AMOXICILLIN 500 MG PO TABS: 2000.0000 mg | ORAL_TABLET | ORAL | 4 refills | Status: AC

## 2021-10-11 MED ORDER — WARFARIN SODIUM 5 MG PO TABS: ORAL_TABLET | ORAL | 0 refills | Status: DC

## 2021-10-11 NOTE — Patient Instructions (Signed)
Description   ONYX VALVE 1.5-2.0: Continue taking 1.5 tablets daily 1 tablet on Mondays, Wednesdays, and Fridays. Recheck INR in 2 week. Call with any questions if ordered new medications or if scheduled for any procedures [phone].

## 2021-10-11 NOTE — Patient Instructions (Addendum)
Medication Instructions:  Your physician has recommended you make the following change in your medication:  TAKE SILDENAFIL 50 MG AS NEEDED.   *If you need a refill on your cardiac medications before your next appointment, please call your pharmacy*   Lab Work: NONE If you have labs (blood work) drawn today and your tests are completely normal, you will receive your results only by: MyChart Message (if you have MyChart) OR A paper copy in the mail If you have any lab test that is abnormal or we need to change your treatment, we will call you to review the results.   Testing/Procedures: NONE   Follow-Up: At [facility], you and your health needs are our priority.  As part of our continuing mission to provide you with exceptional heart care, we have created designated Provider Care Teams.  These Care Teams include your primary Cardiologist (physician) and Advanced Practice Providers (APPs -  Physician Assistants and Nurse Practitioners) who all work together to provide you with the care you need, when you need it.  We recommend signing up for the patient portal called "MyChart".  Sign up information is provided on this After Visit Summary.  MyChart is used to connect with patients for Virtual Visits (Telemedicine).  Patients are able to view lab/test results, encounter notes, upcoming appointments, etc.  Non-urgent messages can be sent to your provider as well.   To learn more about what you can do with MyChart, go to [email or web address].    Your next appointment:   1 year(s)  The format for your next appointment:   In Person  Provider:   You will see one of the following Advanced Practice Providers on your designated Care Team:   Tereso Newcomer, PA-C [place], [place]

## 2021-10-25 ENCOUNTER — Other Ambulatory Visit: Payer: Self-pay

## 2021-10-25 ENCOUNTER — Ambulatory Visit (INDEPENDENT_AMBULATORY_CARE_PROVIDER_SITE_OTHER): Payer: Self-pay | Admitting: *Deleted

## 2021-10-25 DIAGNOSIS — Z5181 Encounter for therapeutic drug level monitoring: Secondary | ICD-10-CM

## 2021-10-25 DIAGNOSIS — Z952 Presence of prosthetic heart valve: Secondary | ICD-10-CM

## 2021-10-25 LAB — POCT INR: INR: 1.3 — AB (ref 2.0–3.0)

## 2021-10-25 NOTE — Patient Instructions (Signed)
Description   ONYX VALVE 1.5-2.0: Today take 1.5 tablets then continue taking 1.5 tablets daily 1 tablet on Mondays, Wednesdays, and Fridays. Recheck INR in 2 weeks. Call with any questions if ordered new medications or if scheduled for any procedures [phone].

## 2021-11-09 ENCOUNTER — Emergency Department (HOSPITAL_COMMUNITY)
Admission: EM | Admit: 2021-11-09 | Discharge: 2021-11-09 | Discharge status: Against Medical Advice | Payer: Medicaid Other

## 2021-11-09 ENCOUNTER — Other Ambulatory Visit: Payer: Self-pay

## 2021-11-09 ENCOUNTER — Other Ambulatory Visit: Payer: Self-pay | Admitting: Cardiovascular Disease

## 2021-11-09 NOTE — ED Notes (Signed)
Pt called for vitals, no response. 

## 2021-11-09 NOTE — Telephone Encounter (Signed)
Appt pending today at 345pm; will address this at visit since pt is noncompliant with visits.

## 2021-11-10 NOTE — Telephone Encounter (Signed)
Left message for the pt to call back to reschedule missed appt

## 2021-11-18 NOTE — Telephone Encounter (Signed)
Pt has an appt pending on 12/12

## 2021-11-24 NOTE — Telephone Encounter (Signed)
Pt missed appt again today, called pt left a message to call back to reschedule. Will await for him to call back.  Pt is overdue for appt & has no showed to multiple appts. Will evaluate refill when he sets an appt

## 2021-12-30 ENCOUNTER — Telehealth: Payer: Self-pay | Admitting: *Deleted

## 2021-12-30 NOTE — Telephone Encounter (Signed)
Called pt since he is overdue for INR monitoring; the phone rang and no voicemail. Therefore, unable to leave a message. Will try back at a later date.

## 2022-01-12 ENCOUNTER — Encounter (HOSPITAL_COMMUNITY): Payer: Self-pay

## 2022-01-12 ENCOUNTER — Other Ambulatory Visit: Payer: Self-pay

## 2022-01-12 ENCOUNTER — Ambulatory Visit (HOSPITAL_COMMUNITY)
Admission: EM | Admit: 2022-01-12 | Discharge: 2022-01-12 | Disposition: A | Discharge status: Home / Self Care | Payer: 59 | Attending: Emergency Medicine | Admitting: Emergency Medicine

## 2022-01-12 DIAGNOSIS — Z23 Encounter for immunization: Secondary | ICD-10-CM

## 2022-01-12 DIAGNOSIS — S61210A Laceration without foreign body of right index finger without damage to nail, initial encounter: Secondary | ICD-10-CM | POA: Diagnosis not present

## 2022-01-12 MED ORDER — LIDOCAINE HCL (PF) 1 % IJ SOLN
INTRAMUSCULAR | Status: AC
  Filled 2022-01-12: qty 2

## 2022-01-12 MED ORDER — TETANUS-DIPHTH-ACELL PERTUSSIS 5-2.5-18.5 LF-MCG/0.5 IM SUSY
PREFILLED_SYRINGE | INTRAMUSCULAR | Status: AC
  Filled 2022-01-12: qty 0.5

## 2022-01-12 MED ORDER — BACITRACIN ZINC 500 UNIT/GM EX OINT
TOPICAL_OINTMENT | CUTANEOUS | Status: AC
  Filled 2022-01-12: qty 0.9

## 2022-01-12 MED ORDER — TETANUS-DIPHTH-ACELL PERTUSSIS 5-2.5-18.5 LF-MCG/0.5 IM SUSY
0.5000 mL | PREFILLED_SYRINGE | Freq: Once | INTRAMUSCULAR | Status: AC
  Administered 2022-01-12: 0.5 mL via INTRAMUSCULAR

## 2022-01-12 NOTE — Discharge Instructions (Addendum)
Return to Urgent Care to have your stitches out in 7 days.   Keep wound clean and dry. If bandage gets wet (such as in a shower), change the bandage to a clean one, applying antibiotic ointment.   Watch for signs of infection: increased pain, redness, swelling, warmth, or draining pus. Have your finger checked right away if you notice any of these signs.   Follow up with the infectious disease clinic.

## 2022-01-12 NOTE — ED Triage Notes (Signed)
Pt presents to office for right finger injury. Pt reports messing around with wire this afternoon and cut his finger.

## 2022-01-14 DIAGNOSIS — S61210A Laceration without foreign body of right index finger without damage to nail, initial encounter: Secondary | ICD-10-CM | POA: Diagnosis not present
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
## 2022-01-20 ENCOUNTER — Other Ambulatory Visit: Payer: Self-pay

## 2022-01-20 ENCOUNTER — Ambulatory Visit (INDEPENDENT_AMBULATORY_CARE_PROVIDER_SITE_OTHER): Payer: 59

## 2022-01-20 DIAGNOSIS — Z5181 Encounter for therapeutic drug level monitoring: Secondary | ICD-10-CM | POA: Diagnosis not present

## 2022-01-20 DIAGNOSIS — Z952 Presence of prosthetic heart valve: Secondary | ICD-10-CM

## 2022-01-20 LAB — POCT INR: INR: 1.1 — AB (ref 2.0–3.0)

## 2022-01-20 MED ORDER — WARFARIN SODIUM 5 MG PO TABS: ORAL_TABLET | ORAL | 0 refills | Status: DC

## 2022-01-20 NOTE — Telephone Encounter (Signed)
Prescription refill request received for warfarin Lov: 10/11/21 ([name])  Next INR check: 01/27/22 Warfarin tablet strength: 5mg   Appropriate dose and refill sent to requested pharmacy.

## 2022-01-20 NOTE — Patient Instructions (Signed)
Description   ONYX VALVE 1.5-2.0: Today take 2 tablets and 1.5 tablets tomorrow and then continue taking 1.5 tablets daily 1 tablet on Mondays, Wednesdays, and Fridays.  Recheck INR in 1 week.  Call with any questions if ordered new medications or if scheduled for any procedures [phone].

## 2022-02-07 ENCOUNTER — Other Ambulatory Visit: Payer: Self-pay | Admitting: Cardiovascular Disease

## 2022-02-07 NOTE — Telephone Encounter (Addendum)
Prescription refill request received for warfarin Lov: 10/11/21 ([name]) Next INR check: OVERDUE Warfarin tablet strength: 5mg   Pt overdue for INR check. Called pt and scheduled anticoagulation appt for this Thursday, 02/10/22. Pt stated he has enough Warfarin until appt.

## 2022-02-11 NOTE — Telephone Encounter (Signed)
Called pt since he missed his last appt; there was no answer and no voicemail, therefore, unable to leave a message.  ?

## 2022-02-14 NOTE — Telephone Encounter (Signed)
Called pt again since he is overdue and another warfarin refill was received; will not refill as this is a monitored medication. Also, left a message for the pt to call back regarding scheduling an appt.  ?

## 2022-02-21 ENCOUNTER — Other Ambulatory Visit: Payer: Self-pay

## 2022-02-21 ENCOUNTER — Ambulatory Visit (HOSPITAL_COMMUNITY): Payer: 59

## 2022-02-21 ENCOUNTER — Ambulatory Visit (HOSPITAL_COMMUNITY)
Admission: EM | Admit: 2022-02-21 | Discharge: 2022-02-21 | Disposition: A | Discharge status: Home / Self Care | Payer: 59

## 2022-03-24 ENCOUNTER — Other Ambulatory Visit: Payer: Self-pay

## 2022-03-24 MED ORDER — WARFARIN SODIUM 5 MG PO TABS: ORAL_TABLET | ORAL | 0 refills | Status: DC

## 2022-03-24 NOTE — Telephone Encounter (Signed)
Prescription refill request received for warfarin ?Lov: 10/11/21 ([name]) ?Next INR check: OVERDUE - 03/29/22 ?Warfarin tablet strength: 5mg  ? ?Pt called and stated he is out of Coumadin. Anticoagulation appt scheduled for 03/29/22 and small refill sent to requested pharmacy to last until appt on 03/29/22.  ?

## 2022-03-29 ENCOUNTER — Ambulatory Visit (INDEPENDENT_AMBULATORY_CARE_PROVIDER_SITE_OTHER): Payer: 59 | Admitting: *Deleted

## 2022-03-29 DIAGNOSIS — Z5181 Encounter for therapeutic drug level monitoring: Secondary | ICD-10-CM | POA: Diagnosis not present

## 2022-03-29 DIAGNOSIS — Z952 Presence of prosthetic heart valve: Secondary | ICD-10-CM

## 2022-03-29 LAB — POCT INR: INR: 1.3 — AB (ref 2.0–3.0)

## 2022-03-29 NOTE — Patient Instructions (Signed)
Description   ?ONYX VALVE 1.5-2.0: Today take 2 tablets then continue taking 1.5 tablets daily 1 tablet on Mondays, Wednesdays, and Fridays. Recheck INR in 3 weeks. ?Call with any questions if ordered new medications or if scheduled for any procedures [phone].  ?  ?  ?

## 2022-03-30 ENCOUNTER — Encounter (HOSPITAL_COMMUNITY): Payer: Self-pay

## 2022-03-30 ENCOUNTER — Emergency Department (HOSPITAL_COMMUNITY)
Admission: EM | Admit: 2022-03-30 | Discharge: 2022-03-30 | Disposition: A | Discharge status: Home / Self Care | Payer: 59 | Attending: Emergency Medicine | Admitting: Emergency Medicine

## 2022-03-30 ENCOUNTER — Emergency Department (HOSPITAL_COMMUNITY): Payer: 59

## 2022-03-30 ENCOUNTER — Encounter (HOSPITAL_COMMUNITY): Payer: Self-pay | Admitting: Emergency Medicine

## 2022-03-30 ENCOUNTER — Ambulatory Visit (HOSPITAL_COMMUNITY)
Admission: RE | Admit: 2022-03-30 | Discharge: 2022-03-30 | Disposition: A | Discharge status: Home / Self Care | Payer: 59 | Source: Ambulatory Visit

## 2022-03-30 ENCOUNTER — Other Ambulatory Visit: Payer: Self-pay

## 2022-03-30 VITALS — BP 108/77 | HR 77 | Temp 97.5°F | Resp 18

## 2022-03-30 DIAGNOSIS — W228XXA Striking against or struck by other objects, initial encounter: Secondary | ICD-10-CM | POA: Insufficient documentation

## 2022-03-30 DIAGNOSIS — Z7901 Long term (current) use of anticoagulants: Secondary | ICD-10-CM | POA: Insufficient documentation

## 2022-03-30 DIAGNOSIS — S0990XA Unspecified injury of head, initial encounter: Secondary | ICD-10-CM

## 2022-03-30 DIAGNOSIS — S0081XA Abrasion of other part of head, initial encounter: Secondary | ICD-10-CM | POA: Insufficient documentation

## 2022-03-30 DIAGNOSIS — S80811A Abrasion, right lower leg, initial encounter: Secondary | ICD-10-CM | POA: Insufficient documentation

## 2022-03-30 DIAGNOSIS — Z952 Presence of prosthetic heart valve: Secondary | ICD-10-CM | POA: Insufficient documentation

## 2022-03-30 DIAGNOSIS — M25561 Pain in right knee: Secondary | ICD-10-CM

## 2022-03-30 DIAGNOSIS — M25461 Effusion, right knee: Secondary | ICD-10-CM | POA: Insufficient documentation

## 2022-03-30 MED ORDER — SILDENAFIL CITRATE 50 MG PO TABS: 50.0000 mg | ORAL_TABLET | Freq: Every day | ORAL | 6 refills | Status: DC | PRN

## 2022-03-30 MED ORDER — WARFARIN SODIUM 5 MG PO TABS: ORAL_TABLET | ORAL | 0 refills | Status: DC

## 2022-03-30 NOTE — Telephone Encounter (Signed)
Prescription refill request received for warfarin ?Lov: 10/11/21 ([name]) ?Next INR check: 04/20/22 ?Warfarin tablet strength: 5mg  ? ?Appropriate dose ad refill sent to requested pharmacy.  ?

## 2022-03-30 NOTE — ED Notes (Signed)
Patient is being discharged from the Urgent Care and sent to the Emergency Department via POV . Per Stevie Kern, NP, patient is in need of higher level of care due to evaluation. Patient is aware and verbalizes understanding of plan of care.  ?Vitals:  ? 03/30/22 1822  ?BP: 108/77  ?Pulse: 77  ?Resp: 18  ?Temp: (!) 97.5 ?F (36.4 ?C)  ?SpO2: 98%  ? ? ?

## 2022-03-30 NOTE — Telephone Encounter (Signed)
Pt's pharmacy is requesting a refill on sildenafil. Would Dr. [name] like to refill this medication? Please address 

## 2022-03-30 NOTE — Discharge Instructions (Addendum)
You were evaluated in the Emergency Department and after careful evaluation, we did not find any emergent condition requiring admission or further testing in the hospital. ? ?Your CT scan was overall reassuring.  There is no evidence of head bleed.  Your right knee x-ray did not show any fractures however we cannot rule out ligamentous injury.  Please use the crutches and knee brace, you may take Tylenol for pain.  Keep your knee elevated and ice as tolerated.  Please make sure to follow-up with Dr. Linna Caprice who is the orthopedist for further evaluation of your right knee.  Please call the phone number in your discharge paperwork and schedule an appointment. ? ?Please return to the Emergency Department if you experience any worsening of your condition.  Thank you for allowing [place] to be a part of your care. ? ?
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
## 2022-03-30 NOTE — ED Notes (Signed)
To room after CT ?

## 2022-03-30 NOTE — Progress Notes (Signed)
Orthopedic Tech Progress Note ?Patient Details:  ?Jeffery Bennett ?1981/03/20 ?[id number] ? ?Ortho Devices ?Type of Ortho Device: Knee Sleeve, Crutches ?Ortho Device/Splint Location: RLE ?Ortho Device/Splint Interventions: Ordered, Application, Adjustment ?  ?Post Interventions ?Patient Tolerated: Well ?Instructions Provided: Adjustment of device, Poper ambulation with device, Care of device ?Pt stated he knew how to use crutches and did not want to do crutch training. ?[place] ?03/30/2022, 8:54 PM ? ?
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
## 2022-04-05 ENCOUNTER — Ambulatory Visit: Payer: 59 | Admitting: Physician Assistant

## 2022-04-07 ENCOUNTER — Encounter: Payer: Self-pay | Admitting: Physician Assistant

## 2022-04-07 ENCOUNTER — Ambulatory Visit: Payer: 59 | Admitting: Physician Assistant

## 2022-04-07 ENCOUNTER — Telehealth: Payer: Self-pay

## 2022-04-07 DIAGNOSIS — M25561 Pain in right knee: Secondary | ICD-10-CM | POA: Diagnosis not present
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
## 2022-04-07 NOTE — Telephone Encounter (Signed)
**Note De-Identified Jeffery Bennett Obfuscation** Greggory Stallion Key: [place] Case ID: [id number] - Rx #: [id number] ?Outcome: N/Atoday ?PA Case: [id number], Status: Closed, Closed Reason Code:  ?FM Product not covered by this plan. Prior Authorization not available., Closed Rationale: [employer] Rx Prior Authorization team is unable to review this product for a coverage determination as the requested medication is not on the patient's formulary.  ?Drug: Sildenafil Citrate 50MG  tablets ?Form: Production designer, theatre/television/film Prior Authorization Form [phone] NCPDP) ?Original Claim Info ?NON-FORMULARY DRUG REQUESTS NEED TO BE ROUTED TO PLAN. PRODUCT NOT ON FORMULARY.DRUG CLASS SEXUAL DYSFUNCTION, ORAL EXCLUDED BY PLAN BENEFIT. ?

## 2022-04-07 NOTE — Telephone Encounter (Signed)
**Note De-Identified Jeffery Bennett Obfuscation** Sildenafil PA started through covermymeds. ?Key: [id number] ?

## 2022-04-08 NOTE — Telephone Encounter (Signed)
Attempted to call patient to review advice from Dr Acie Fredrickson and to address which pharmacy we should send medication into, but only number on file had no answer and states "voice mail box full and cannot accept messages." Pt hasn't logged into MyChart since 2019. Will re-attempt call at later time. ?

## 2022-04-11 NOTE — Telephone Encounter (Signed)
Left detailed message per DPR asking where pt would like sildenafil RX sent. ?

## 2022-04-13 ENCOUNTER — Other Ambulatory Visit: Payer: Self-pay

## 2022-04-13 MED ORDER — SILDENAFIL CITRATE 100 MG PO TABS: ORAL_TABLET | ORAL | 0 refills | Status: DC

## 2022-04-13 NOTE — Addendum Note (Signed)
Addended by: Lars Mage on: 04/13/2022 01:56 PM ? ? Modules accepted: Orders ? ?

## 2022-04-13 NOTE — Telephone Encounter (Signed)
[name], Wonda Cheng, MD  Via, Deliah Boston, LPN 6 days ago  ? ?He should get a primary md who could address this but it does not look like he has one  ? ?He should [place] Good Rx app on phone  ?Sildenafil 100 mg tabs are available through the goodRx program is available through multiple pharmacies  ? ?[facility]  ?2 .44 at [facility]  ?[facility]251 [facility]  ?13 at [facility]  ? ?Ask him where he would like to go and please send in a script for  ?Sildenafil 100 mg    ?Take as needed  ?#30  ? ?To his pharmacy of choice  ?He should go to the websiet  ?Goodrx.com   ? ?Per Lisette Abu, RN: ?Hey, I saw where you tried to contact this pt last week. [facility] and stated he wanted it sent to [facility]. ? ?[facility]. ?

## 2022-04-13 NOTE — Telephone Encounter (Signed)
Patient returning your call from last week regarding sildenafil. Please call back. ?

## 2022-05-31 ENCOUNTER — Ambulatory Visit (INDEPENDENT_AMBULATORY_CARE_PROVIDER_SITE_OTHER): Payer: 59

## 2022-05-31 DIAGNOSIS — Z5181 Encounter for therapeutic drug level monitoring: Secondary | ICD-10-CM

## 2022-05-31 DIAGNOSIS — Z952 Presence of prosthetic heart valve: Secondary | ICD-10-CM

## 2022-05-31 LAB — POCT INR: INR: 1.1 — AB (ref 2.0–3.0)

## 2022-05-31 MED ORDER — WARFARIN SODIUM 5 MG PO TABS: ORAL_TABLET | ORAL | 0 refills | Status: DC

## 2022-05-31 NOTE — Patient Instructions (Signed)
ONYX VALVE 1.5-2.0: Today take 2 tablets tonight only and  then continue taking 1.5 tablets daily 1 tablet on Mondays, Wednesdays, and Fridays. Recheck INR in 2 weeks. Call with any questions if ordered new medications or if scheduled for any procedures [phone].

## 2022-06-17 ENCOUNTER — Telehealth: Payer: Self-pay | Admitting: *Deleted

## 2022-06-17 NOTE — Telephone Encounter (Signed)
Pt was due to have INR checked today. Called pt but was not able to get in touch with him and was unable to leave voicemail because the mail box is full.
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
## 2022-07-26 ENCOUNTER — Telehealth: Payer: Self-pay | Admitting: *Deleted

## 2022-07-26 NOTE — Telephone Encounter (Signed)
Called pt since he is overdue for iNr monitoring; the mailbox was full so unable to leave a message. Will try at a later date/time.

## 2022-08-25 ENCOUNTER — Telehealth: Payer: Self-pay | Admitting: *Deleted

## 2022-08-25 NOTE — Telephone Encounter (Signed)
Called pt since he is overdue for INR monitoring; left a message for the pt to call back regarding an appt.

## 2022-09-09 ENCOUNTER — Other Ambulatory Visit: Payer: Self-pay

## 2022-09-09 MED ORDER — WARFARIN SODIUM 5 MG PO TABS: ORAL_TABLET | ORAL | 0 refills | Status: DC

## 2022-09-15 ENCOUNTER — Other Ambulatory Visit: Payer: Self-pay

## 2022-09-15 ENCOUNTER — Ambulatory Visit: Payer: 59

## 2022-09-15 MED ORDER — WARFARIN SODIUM 5 MG PO TABS: ORAL_TABLET | ORAL | 0 refills | Status: DC

## 2022-09-21 ENCOUNTER — Ambulatory Visit: Payer: Medicaid Other

## 2022-09-22 ENCOUNTER — Ambulatory Visit: Payer: Medicaid Other | Attending: Cardiology | Admitting: *Deleted

## 2022-09-22 DIAGNOSIS — Z952 Presence of prosthetic heart valve: Secondary | ICD-10-CM

## 2022-09-22 DIAGNOSIS — Z5181 Encounter for therapeutic drug level monitoring: Secondary | ICD-10-CM

## 2022-09-22 LAB — POCT INR: INR: 1.1 — AB (ref 2.0–3.0)

## 2022-09-22 MED ORDER — WARFARIN SODIUM 5 MG PO TABS: ORAL_TABLET | ORAL | 0 refills | Status: DC

## 2022-09-22 NOTE — Patient Instructions (Signed)
Description   ONYX VALVE 1.5-2.0: Today take 2 tablets and 1.5 tablets tomorrow then continue taking 1.5 tablets daily 1 tablet on Mondays, Wednesdays, and Fridays. Recheck INR in 1 week. Call with any questions if ordered new medications or if scheduled for any procedures [phone].

## 2022-09-23 ENCOUNTER — Telehealth: Payer: Self-pay | Admitting: Licensed Clinical Social Worker

## 2022-09-23 NOTE — Progress Notes (Signed)
  Heart and Vascular Care Navigation  09/23/2022  Jeffery Bennett 25-Feb-1981 [id number]  Reason for Referral:  Patient is participating in a Managed Medicaid Plan: No, [employer] commercial insurance only.   Engaged with patient by telephone for initial visit for Heart and Vascular Care Coordination.                                                                                                   Assessment:   LCSW was able to reach pt via telephone at [phone]. Introduced self, role, reason for call. Pt confirmed home address, phone and emergency contact. He does not have a current PCP. He previously had [employer], no longer an active insurance company. He now has [employer] but states his OOP is $6000 and he is concerned about the bills and may want to switch coverage. LCSW encouraged pt to f/u with [place] so he can talk about his options for changing during special enrollment period or how to change during open enrollment. Pt agreeable. We also discussed importance of establishing with PCP- he will consider doing so. No additional questions/concerns at this time.                                     HRT/VAS Care Coordination     Patients Home Cardiology Office [place] Team Social Worker   Social Worker Name: Valeda Malm, [place] [facility] [phone]   Living arrangements for the past 2 months Single Family Home   Lives with: Significant Other   Patient Current Insurance Nutritional therapist   Patient Has Concern With [place] Yes   Patient Concerns With Medical Bills doesnt feel his current insurance will meet needs   Medical Bill Referrals: Healthcare Marketplace Navigator   Does Patient Have Prescription Coverage? Yes   Home Assistive Devices/Equipment None   DME [place]       Social History:                                                                              SDOH Screenings   Financial Resource Strain: Low Risk  (09/23/2022)  Tobacco Use: High Risk (04/07/2022)    SDOH Interventions: Financial Resources:  Museum/gallery curator Strain Interventions: Development worker, community, Other (Comment) Dentist)     Follow-up plan:   LCSW has mailed pt my card, PCP list and Chief of Staff from [employer]. LCSW encouraged pt to f/u if any additional questions/concerns arise.

## 2022-09-29 ENCOUNTER — Ambulatory Visit: Payer: Medicaid Other

## 2022-10-04 ENCOUNTER — Ambulatory Visit: Payer: 59 | Attending: Interventional Cardiology

## 2022-10-04 DIAGNOSIS — Z952 Presence of prosthetic heart valve: Secondary | ICD-10-CM

## 2022-10-04 DIAGNOSIS — Z5181 Encounter for therapeutic drug level monitoring: Secondary | ICD-10-CM

## 2022-10-04 LAB — POCT INR: INR: 1.2 — AB (ref 2.0–3.0)

## 2022-10-04 NOTE — Patient Instructions (Signed)
ONYX VALVE 1.5-2.0: Today take 2 tablets and then INCREASE TO 1.5 tablets daily except 1 tablet on Mondays and Fridays. Recheck INR in 2 weeks. Call with any questions if ordered new medications or if scheduled for any procedures [phone].

## 2022-10-18 ENCOUNTER — Ambulatory Visit: Payer: 59 | Attending: Interventional Cardiology

## 2022-11-13 ENCOUNTER — Ambulatory Visit (HOSPITAL_COMMUNITY)
Admission: EM | Admit: 2022-11-13 | Discharge: 2022-11-13 | Disposition: A | Discharge status: Home / Self Care | Payer: BLUE CROSS/BLUE SHIELD

## 2022-11-13 ENCOUNTER — Encounter (HOSPITAL_COMMUNITY): Payer: Self-pay | Admitting: Emergency Medicine

## 2022-11-13 ENCOUNTER — Other Ambulatory Visit: Payer: Self-pay

## 2022-11-13 DIAGNOSIS — Z8679 Personal history of other diseases of the circulatory system: Secondary | ICD-10-CM

## 2022-11-13 DIAGNOSIS — F1990 Other psychoactive substance use, unspecified, uncomplicated: Secondary | ICD-10-CM | POA: Diagnosis not present

## 2022-11-13 DIAGNOSIS — L089 Local infection of the skin and subcutaneous tissue, unspecified: Secondary | ICD-10-CM | POA: Diagnosis not present

## 2022-11-13 DIAGNOSIS — Z952 Presence of prosthetic heart valve: Secondary | ICD-10-CM | POA: Diagnosis not present

## 2022-11-13 NOTE — ED Notes (Signed)
At bedside for provider discussion of consequences of leaving AMA and refusal to go to ED for necessary care.  Patient acknowledged consequences and stomped out of clinic.  Form was signed and to be scanned into medical record

## 2022-11-13 NOTE — ED Triage Notes (Addendum)
Patient reports left hand swelling 2 days ago.  Not sure of cause.  Patient speculates being pricked by briars when doing yard work.  Hand is swollen, red, warm to touch.  No visible injury.  Scratches on forearms/wrists.  Patient has brisk cap refill and 2 + left radial pulse

## 2022-11-13 NOTE — Discharge Instructions (Addendum)
Patient left AMA. Signed form.
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
## 2022-11-24 ENCOUNTER — Ambulatory Visit (INDEPENDENT_AMBULATORY_CARE_PROVIDER_SITE_OTHER): Payer: BLUE CROSS/BLUE SHIELD | Admitting: Family Medicine

## 2022-11-24 ENCOUNTER — Encounter: Payer: Self-pay | Admitting: Family Medicine

## 2022-11-24 VITALS — BP 100/78 | HR 81 | Temp 97.7°F | Ht 68.0 in | Wt 232.0 lb

## 2022-11-24 DIAGNOSIS — F17210 Nicotine dependence, cigarettes, uncomplicated: Secondary | ICD-10-CM | POA: Diagnosis not present

## 2022-11-24 DIAGNOSIS — Z0000 Encounter for general adult medical examination without abnormal findings: Secondary | ICD-10-CM

## 2022-11-24 DIAGNOSIS — Z7901 Long term (current) use of anticoagulants: Secondary | ICD-10-CM | POA: Diagnosis not present

## 2022-11-24 DIAGNOSIS — N529 Male erectile dysfunction, unspecified: Secondary | ICD-10-CM

## 2022-11-24 DIAGNOSIS — Z5181 Encounter for therapeutic drug level monitoring: Secondary | ICD-10-CM | POA: Diagnosis not present

## 2022-11-24 DIAGNOSIS — Z952 Presence of prosthetic heart valve: Secondary | ICD-10-CM

## 2022-11-24 LAB — PT WITH INR/FINGERSTICK
INR, fingerstick: 1 ratio
PT, fingerstick: 12.2 s (ref 10.5–13.1)

## 2022-11-24 MED ORDER — BUPROPION HCL ER (SR) 150 MG PO TB12: 150.0000 mg | ORAL_TABLET | Freq: Two times a day (BID) | ORAL | 3 refills | Status: AC

## 2022-11-24 MED ORDER — SILDENAFIL CITRATE 100 MG PO TABS: ORAL_TABLET | ORAL | 0 refills | Status: DC

## 2022-11-24 MED ORDER — WARFARIN SODIUM 5 MG PO TABS: ORAL_TABLET | ORAL | 0 refills | Status: DC
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
## 2022-11-24 NOTE — Assessment & Plan Note (Signed)

## 2022-11-24 NOTE — Assessment & Plan Note (Signed)
Start Wellbutrin 150mg  daily for 3 days then 150mg  BID. Quit date 12/08/2022. I congratulated him on being motivated to quit. Will follow up in 4 weeks. Educated on use and risks of medication.

## 2022-11-24 NOTE — Patient Instructions (Signed)
Take 10mg  Coumadin (2 tablets) today. Resume 5mg  (1 tablet) and 7.5mg  (1.5 tablet) daily alternating starting tomorrow. Come back 12/19 for recheck  Start wellbutrin 150mg  daily for 3 days. Increase to 150mg  twice daily after 3 days. Quit smoking 12/28! Congratulations!! I'm very proud of you.  It was great to meet you today and I'm excited to have you join the [facility] practice. I hope you had a positive experience today! If you feel so inclined, please feel free to recommend our practice to friends and family. , FNP-C

## 2022-11-24 NOTE — Assessment & Plan Note (Addendum)
INR goal 1.5-2 per cardiology for hx AVR. Has been off of his coumadin for 2 days. INR 1.0 today in office. Will restart coumadin today at 10mg  Coumadin today and then resume 5mg  and 7.5mg  daily alternating starting tomorrow. Return to office for recheck in 5 days.

## 2022-12-21 ENCOUNTER — Other Ambulatory Visit: Payer: Self-pay | Admitting: Family Medicine

## 2022-12-21 DIAGNOSIS — Z7901 Long term (current) use of anticoagulants: Secondary | ICD-10-CM

## 2022-12-21 DIAGNOSIS — Z5181 Encounter for therapeutic drug level monitoring: Secondary | ICD-10-CM

## 2022-12-21 DIAGNOSIS — Z952 Presence of prosthetic heart valve: Secondary | ICD-10-CM

## 2023-01-19 ENCOUNTER — Other Ambulatory Visit: Payer: Self-pay | Admitting: *Deleted

## 2023-01-19 DIAGNOSIS — Z952 Presence of prosthetic heart valve: Secondary | ICD-10-CM

## 2023-01-19 DIAGNOSIS — Z5181 Encounter for therapeutic drug level monitoring: Secondary | ICD-10-CM

## 2023-01-19 DIAGNOSIS — Z7901 Long term (current) use of anticoagulants: Secondary | ICD-10-CM

## 2023-01-19 MED ORDER — WARFARIN SODIUM 5 MG PO TABS: ORAL_TABLET | ORAL | 0 refills | Status: DC

## 2023-01-19 NOTE — Telephone Encounter (Signed)
Pt states he went to PCP but does not want to go there anymore. He is aware he needs to adhere to appt.  Advised since been out of warfarin for 3 days to take warfarin 1.5 tablets Thursday, Friday, Saturday, Sunday then on Monday and take 1 tablet on Monday then apt on Tuesday. He will need to make an appt with Dr. Acie Fredrickson

## 2023-01-24 ENCOUNTER — Telehealth: Payer: Self-pay | Admitting: *Deleted

## 2023-01-24 ENCOUNTER — Ambulatory Visit: Payer: BLUE CROSS/BLUE SHIELD

## 2023-01-24 ENCOUNTER — Ambulatory Visit: Payer: BLUE CROSS/BLUE SHIELD | Attending: Cardiology | Admitting: *Deleted

## 2023-01-24 DIAGNOSIS — Z952 Presence of prosthetic heart valve: Secondary | ICD-10-CM | POA: Diagnosis not present

## 2023-01-24 DIAGNOSIS — Z5181 Encounter for therapeutic drug level monitoring: Secondary | ICD-10-CM | POA: Diagnosis not present

## 2023-01-24 DIAGNOSIS — Z7901 Long term (current) use of anticoagulants: Secondary | ICD-10-CM

## 2023-01-24 LAB — POCT INR: POC INR: 1.5

## 2023-01-24 MED ORDER — WARFARIN SODIUM 5 MG PO TABS: ORAL_TABLET | ORAL | 0 refills | Status: DC

## 2023-01-24 NOTE — Telephone Encounter (Addendum)
Pt is needing a refill of sildenafil. Pt no longer  has a PCP. Pt would like prescription sent to [facility] on [address].   Pt is overdue to see cardiologist, pt is scheduled to see Dr. Acie Fredrickson 2/26

## 2023-01-24 NOTE — Patient Instructions (Signed)
Description   ONYX VALVE 1.5-2.0:Continue to take warfarin 1.5 tablets daily except 1 tablet on Mondays and Fridays. Recheck INR in 2 weeks. Call with any questions if ordered new medications or if scheduled for any procedures [phone].

## 2023-01-25 NOTE — Telephone Encounter (Signed)
Attempted to call patient but both numbers on file are the same. It rings, but voicemail box is full. We will wait to refill sildenafil once pt comes in to be seen.

## 2023-02-05 ENCOUNTER — Encounter: Payer: Self-pay | Admitting: Cardiovascular Disease

## 2023-02-05 NOTE — Progress Notes (Signed)
This encounter was created in error - please disregard.

## 2023-02-06 ENCOUNTER — Ambulatory Visit: Payer: BLUE CROSS/BLUE SHIELD | Admitting: Cardiovascular Disease

## 2023-02-07 ENCOUNTER — Ambulatory Visit: Payer: BLUE CROSS/BLUE SHIELD | Attending: Cardiology

## 2023-02-07 ENCOUNTER — Other Ambulatory Visit: Payer: Self-pay

## 2023-02-07 DIAGNOSIS — Z952 Presence of prosthetic heart valve: Secondary | ICD-10-CM | POA: Diagnosis not present

## 2023-02-07 DIAGNOSIS — Z5181 Encounter for therapeutic drug level monitoring: Secondary | ICD-10-CM | POA: Diagnosis not present

## 2023-02-07 DIAGNOSIS — N529 Male erectile dysfunction, unspecified: Secondary | ICD-10-CM

## 2023-02-07 LAB — POCT INR: INR: 1.6 — AB (ref 2.0–3.0)

## 2023-02-07 NOTE — Telephone Encounter (Signed)
Pt's pharmacy is requesting a refill on sildenafil. Would Dr. Acie Fredrickson like to refill this medication? Please address

## 2023-02-07 NOTE — Patient Instructions (Signed)
Description   ONYX VALVE 1.5-2.0:Continue to take warfarin 1.5 tablets daily except 1 tablet on Mondays and Fridays. Recheck INR in 4 weeks. Call with any questions if ordered new medications or if scheduled for any procedures [phone].

## 2023-02-07 NOTE — Telephone Encounter (Signed)
Pt was seen in [facility] today 02/07/23.  Pt missed his appt with Dr Acie Fredrickson yesterday 2/26, he r/s his missed appt to Friday 02/17/23 with Dr Acie Fredrickson.  Pt no longer has a PCP and he is requesting a refill on his sildenafil prescription.  Advised pt I would forward refill request to see if Dr Acie Fredrickson is willing to refill this rx for him.  Please call pt to advise if able to refill rx. Thanks
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
## 2023-02-17 ENCOUNTER — Ambulatory Visit: Payer: BLUE CROSS/BLUE SHIELD | Attending: Cardiovascular Disease | Admitting: Cardiovascular Disease

## 2023-02-17 ENCOUNTER — Encounter: Payer: Self-pay | Admitting: Cardiovascular Disease

## 2023-02-17 VITALS — BP 102/78 | HR 79 | Ht 70.0 in | Wt 227.4 lb

## 2023-02-17 DIAGNOSIS — Z952 Presence of prosthetic heart valve: Secondary | ICD-10-CM | POA: Diagnosis not present

## 2023-02-17 DIAGNOSIS — N529 Male erectile dysfunction, unspecified: Secondary | ICD-10-CM

## 2023-02-17 MED ORDER — SILDENAFIL CITRATE 100 MG PO TABS: ORAL_TABLET | ORAL | 11 refills | Status: AC

## 2023-02-17 NOTE — Patient Instructions (Signed)
Medication Instructions:  Sildenafil Refilled *If you need a refill on your cardiac medications before your next appointment, please call your pharmacy*   Lab Work: NONE If you have labs (blood work) drawn today and your tests are completely normal, you will receive your results only by: [place] (if you have MyChart) OR A paper copy in the mail If you have any lab test that is abnormal or we need to change your treatment, we will call you to review the results.  Testing/Procedures: NONE  Follow-Up: At [facility], you and your health needs are our priority.  As part of our continuing mission to provide you with exceptional heart care, we have created designated Provider Care Teams.  These Care Teams include your primary Cardiologist (physician) and Advanced Practice Providers (APPs -  Physician Assistants and Nurse Practitioners) who all work together to provide you with the care you need, when you need it.  Your next appointment:   1 year(s)  Provider:   Mertie Moores, MD

## 2023-02-22 ENCOUNTER — Other Ambulatory Visit: Payer: Self-pay | Admitting: Cardiovascular Disease

## 2023-02-22 DIAGNOSIS — Z5181 Encounter for therapeutic drug level monitoring: Secondary | ICD-10-CM

## 2023-02-22 DIAGNOSIS — Z7901 Long term (current) use of anticoagulants: Secondary | ICD-10-CM

## 2023-02-22 DIAGNOSIS — Z952 Presence of prosthetic heart valve: Secondary | ICD-10-CM

## 2023-03-07 ENCOUNTER — Ambulatory Visit: Payer: BLUE CROSS/BLUE SHIELD | Attending: Cardiology

## 2023-03-21 ENCOUNTER — Telehealth: Payer: Self-pay | Admitting: *Deleted

## 2023-03-21 ENCOUNTER — Other Ambulatory Visit: Payer: Self-pay | Admitting: Cardiovascular Disease

## 2023-03-21 DIAGNOSIS — Z7901 Long term (current) use of anticoagulants: Secondary | ICD-10-CM

## 2023-03-21 DIAGNOSIS — Z5181 Encounter for therapeutic drug level monitoring: Secondary | ICD-10-CM

## 2023-03-21 DIAGNOSIS — Z952 Presence of prosthetic heart valve: Secondary | ICD-10-CM

## 2023-03-21 NOTE — Telephone Encounter (Addendum)
Pt needs an appt with Anticoagulation Clinic. Last seen in Anticoagulation Clinic 02/07/23 and last OV 02/17/23 with Dr. Elease Hashimoto. Dx: S/p AVR   At 954am-called pt no answer and voicemail full. Will try back later

## 2023-03-21 NOTE — Telephone Encounter (Signed)
Called pt again regarding an appointment for Anticoagulation Clinic. There was no answer and voicemail full.

## 2023-03-21 NOTE — Telephone Encounter (Signed)
Called pt since pending refill request. There was no answer and voicemail is full. Will try back at a later time/date.

## 2023-03-22 NOTE — Telephone Encounter (Signed)
Called pt for an Anticoagulation Appt; no answer & voicemail full.

## 2023-05-19 ENCOUNTER — Ambulatory Visit: Payer: BLUE CROSS/BLUE SHIELD | Attending: Internal Medicine

## 2023-05-19 ENCOUNTER — Telehealth: Payer: Self-pay | Admitting: *Deleted

## 2023-05-19 NOTE — Telephone Encounter (Signed)
Called pt since missed Anticoagulation appt; there was no answer and mailbox is full. Will try back at a later date/time.  Called alternative number for significant other and no answer as well & had to leave a generic voicemail for him to call back.

## 2023-06-16 ENCOUNTER — Other Ambulatory Visit: Payer: Self-pay | Admitting: *Deleted

## 2023-06-16 DIAGNOSIS — Z7901 Long term (current) use of anticoagulants: Secondary | ICD-10-CM

## 2023-06-16 DIAGNOSIS — Z952 Presence of prosthetic heart valve: Secondary | ICD-10-CM

## 2023-06-16 DIAGNOSIS — Z5181 Encounter for therapeutic drug level monitoring: Secondary | ICD-10-CM

## 2023-06-16 MED ORDER — WARFARIN SODIUM 5 MG PO TABS: ORAL_TABLET | ORAL | 0 refills | Status: DC

## 2023-06-16 NOTE — Telephone Encounter (Signed)
Received a call from Scheduler and he stated the pt just set an appt for 06/26/23 and needs a warfarin refill. Will send in a 10 day supply until appt.   Last INR 02/07/23 &  was due 03/07/23 Last OV 02/17/23

## 2023-06-23 ENCOUNTER — Other Ambulatory Visit: Payer: Self-pay | Admitting: Cardiovascular Disease

## 2023-06-23 DIAGNOSIS — Z5181 Encounter for therapeutic drug level monitoring: Secondary | ICD-10-CM

## 2023-06-23 DIAGNOSIS — Z7901 Long term (current) use of anticoagulants: Secondary | ICD-10-CM

## 2023-06-23 DIAGNOSIS — Z952 Presence of prosthetic heart valve: Secondary | ICD-10-CM

## 2023-06-26 ENCOUNTER — Ambulatory Visit: Payer: BLUE CROSS/BLUE SHIELD | Attending: Cardiovascular Disease

## 2023-06-26 ENCOUNTER — Other Ambulatory Visit: Payer: Self-pay

## 2023-06-26 DIAGNOSIS — Z0000 Encounter for general adult medical examination without abnormal findings: Secondary | ICD-10-CM

## 2023-06-26 DIAGNOSIS — Z952 Presence of prosthetic heart valve: Secondary | ICD-10-CM

## 2023-06-26 DIAGNOSIS — Z7901 Long term (current) use of anticoagulants: Secondary | ICD-10-CM

## 2023-06-26 DIAGNOSIS — N529 Male erectile dysfunction, unspecified: Secondary | ICD-10-CM

## 2023-06-26 DIAGNOSIS — Z5181 Encounter for therapeutic drug level monitoring: Secondary | ICD-10-CM

## 2023-06-26 LAB — POCT INR: INR: 1.3 — AB (ref 2.0–3.0)

## 2023-06-26 MED ORDER — WARFARIN SODIUM 5 MG PO TABS
ORAL_TABLET | ORAL | 0 refills | Status: DC
Start: 2023-06-26 — End: 2023-07-24

## 2023-06-26 NOTE — Patient Instructions (Signed)
Description   ONYX VALVE 1.5-2.0: Take 1.5 tablets today and then continue to take warfarin 1.5 tablets daily except 1 tablet on Mondays and Fridays.  Recheck INR in 3 weeks. Call with any questions if ordered new medications or if scheduled for any procedures [phone].

## 2023-06-26 NOTE — Telephone Encounter (Signed)
Prescription refill request received for warfarin Lov: 02/17/23 ([name])  Next INR check: 07/17/23 Warfarin tablet strength: 5mg   Appropriate dose. Refill sent.

## 2023-07-17 ENCOUNTER — Ambulatory Visit: Payer: BLUE CROSS/BLUE SHIELD

## 2023-07-20 ENCOUNTER — Ambulatory Visit: Payer: BLUE CROSS/BLUE SHIELD

## 2023-07-20 ENCOUNTER — Telehealth: Payer: Self-pay | Admitting: *Deleted

## 2023-07-20 DIAGNOSIS — Z952 Presence of prosthetic heart valve: Secondary | ICD-10-CM | POA: Diagnosis not present

## 2023-07-20 DIAGNOSIS — Z5181 Encounter for therapeutic drug level monitoring: Secondary | ICD-10-CM | POA: Diagnosis not present

## 2023-07-20 LAB — POCT INR: INR: 1.3 — AB (ref 2.0–3.0)

## 2023-07-20 NOTE — Patient Instructions (Signed)
Description   ONYX VALVE 1.5-2.0: Take 2 tablets today and then continue to take warfarin 1.5 tablets daily except 1 tablet on Mondays and Fridays.  Recheck INR in 3 weeks. Call with any questions if ordered new medications or if scheduled for any procedures [phone].

## 2023-07-20 NOTE — Telephone Encounter (Signed)
Called pt since he due to have INR done & asked if he can come in earlier and he stated he could and would like to come now. Advised to take his time since he needs his INR done and he states he will be here by 1230pm. Will await.

## 2023-07-23 ENCOUNTER — Other Ambulatory Visit: Payer: Self-pay | Admitting: Cardiovascular Disease

## 2023-07-23 DIAGNOSIS — Z7901 Long term (current) use of anticoagulants: Secondary | ICD-10-CM

## 2023-07-23 DIAGNOSIS — Z5181 Encounter for therapeutic drug level monitoring: Secondary | ICD-10-CM

## 2023-07-23 DIAGNOSIS — Z952 Presence of prosthetic heart valve: Secondary | ICD-10-CM

## 2023-08-09 ENCOUNTER — Other Ambulatory Visit: Payer: Self-pay

## 2023-08-09 DIAGNOSIS — Z952 Presence of prosthetic heart valve: Secondary | ICD-10-CM

## 2023-08-09 DIAGNOSIS — Z5181 Encounter for therapeutic drug level monitoring: Secondary | ICD-10-CM

## 2023-08-09 DIAGNOSIS — Z7901 Long term (current) use of anticoagulants: Secondary | ICD-10-CM

## 2023-08-09 MED ORDER — WARFARIN SODIUM 5 MG PO TABS
ORAL_TABLET | ORAL | 0 refills | Status: DC
Start: 2023-08-09 — End: 2023-10-06

## 2023-08-09 NOTE — Telephone Encounter (Signed)
Pt called stating he is out of Warfarin. 1 week supply sent to pharmacy to prevent any missed doses.

## 2023-08-10 ENCOUNTER — Ambulatory Visit: Payer: BLUE CROSS/BLUE SHIELD | Attending: Cardiology

## 2023-10-06 ENCOUNTER — Encounter: Payer: Self-pay | Admitting: *Deleted

## 2023-10-06 ENCOUNTER — Ambulatory Visit: Payer: Medicaid Other | Attending: Cardiovascular Disease | Admitting: *Deleted

## 2023-10-06 DIAGNOSIS — Z952 Presence of prosthetic heart valve: Secondary | ICD-10-CM | POA: Diagnosis not present

## 2023-10-06 DIAGNOSIS — Z7901 Long term (current) use of anticoagulants: Secondary | ICD-10-CM

## 2023-10-06 DIAGNOSIS — Z5181 Encounter for therapeutic drug level monitoring: Secondary | ICD-10-CM | POA: Diagnosis not present

## 2023-10-06 LAB — POCT INR: POC INR: 1

## 2023-10-06 MED ORDER — WARFARIN SODIUM 5 MG PO TABS
ORAL_TABLET | ORAL | 0 refills | Status: DC
Start: 2023-10-06 — End: 2023-11-08

## 2023-10-06 NOTE — Telephone Encounter (Signed)
This encounter was created in error - please disregard.

## 2023-10-06 NOTE — Patient Instructions (Addendum)
Description   ONYX VALVE 1.5-2.0: Take 2 tablets today and then continue to take warfarin 1.5 tablets daily except 1 tablet on Mondays and Fridays.  Recheck INR in 1 week.  Call with any questions if ordered new medications or if scheduled for any procedures [phone].

## 2023-10-13 ENCOUNTER — Ambulatory Visit: Payer: Medicaid Other | Attending: Cardiovascular Disease

## 2023-10-13 ENCOUNTER — Telehealth: Payer: Self-pay

## 2023-10-13 NOTE — Telephone Encounter (Signed)
 Pt missed coumadin clinic appt. Called, no answer. Left message on voicemail.

## 2023-11-08 ENCOUNTER — Other Ambulatory Visit: Payer: Self-pay | Admitting: Cardiovascular Disease

## 2023-11-08 DIAGNOSIS — Z5181 Encounter for therapeutic drug level monitoring: Secondary | ICD-10-CM

## 2023-11-08 DIAGNOSIS — Z7901 Long term (current) use of anticoagulants: Secondary | ICD-10-CM

## 2023-11-08 DIAGNOSIS — Z952 Presence of prosthetic heart valve: Secondary | ICD-10-CM

## 2023-11-08 NOTE — Telephone Encounter (Signed)
Refill request for warfarin:  Last INR was 1.0 on 10/06/23 Next INR due 10/13/23 LOV was 02/17/23  Pt is past due for INR check.  Message sent to schedulers to make appt. Partial refill given.

## 2023-11-16 ENCOUNTER — Other Ambulatory Visit: Payer: Self-pay | Admitting: Cardiovascular Disease

## 2023-11-16 DIAGNOSIS — Z7901 Long term (current) use of anticoagulants: Secondary | ICD-10-CM

## 2023-11-16 DIAGNOSIS — Z5181 Encounter for therapeutic drug level monitoring: Secondary | ICD-10-CM

## 2023-11-16 DIAGNOSIS — Z952 Presence of prosthetic heart valve: Secondary | ICD-10-CM

## 2023-11-16 NOTE — Telephone Encounter (Signed)
Called pt since INR is overdue and he needs a refill on his warfarin. Got pt scheduled for an appt on next Friday at 315pm.  Also, advised will send in enough warfarin for one week.

## 2023-11-16 NOTE — Telephone Encounter (Signed)
Warfarin 5mg  refill  S/p AVR Last INR 10/06/23 & was due 11/1-needs appt Last OV 02/17/23

## 2023-11-24 ENCOUNTER — Ambulatory Visit: Payer: Medicaid Other | Attending: Cardiovascular Disease

## 2023-11-24 ENCOUNTER — Other Ambulatory Visit: Payer: Self-pay | Admitting: Cardiovascular Disease

## 2023-11-24 DIAGNOSIS — Z7901 Long term (current) use of anticoagulants: Secondary | ICD-10-CM

## 2023-11-24 DIAGNOSIS — Z5181 Encounter for therapeutic drug level monitoring: Secondary | ICD-10-CM

## 2023-11-24 DIAGNOSIS — Z952 Presence of prosthetic heart valve: Secondary | ICD-10-CM | POA: Diagnosis not present

## 2023-11-24 DIAGNOSIS — Z0000 Encounter for general adult medical examination without abnormal findings: Secondary | ICD-10-CM | POA: Diagnosis not present

## 2023-11-24 LAB — POCT INR: INR: 1.2 — AB (ref 2.0–3.0)

## 2023-11-24 NOTE — Patient Instructions (Signed)
ONYX VALVE 1.5-2.0: Take 2 tablets today and then continue to take warfarin 1.5 tablets daily except 1 tablet on Mondays and Fridays.  Recheck INR in 3 weeks.  Call with any questions if ordered new medications or if scheduled for any procedures [phone].

## 2023-12-15 ENCOUNTER — Ambulatory Visit: Payer: MEDICAID

## 2023-12-25 ENCOUNTER — Ambulatory Visit: Payer: MEDICAID | Attending: Cardiology

## 2023-12-25 DIAGNOSIS — Z952 Presence of prosthetic heart valve: Secondary | ICD-10-CM

## 2023-12-25 DIAGNOSIS — Z5181 Encounter for therapeutic drug level monitoring: Secondary | ICD-10-CM | POA: Diagnosis not present

## 2023-12-25 DIAGNOSIS — Z0000 Encounter for general adult medical examination without abnormal findings: Secondary | ICD-10-CM

## 2023-12-25 LAB — POCT INR: INR: 1.6 — AB (ref 2.0–3.0)

## 2023-12-25 NOTE — Patient Instructions (Signed)
 ONYX VALVE 1.5-2.0: continue to take warfarin 1.5 tablets daily except 1 tablet on Mondays and Fridays.  Recheck INR in 6 weeks.  Call with any questions if ordered new medications or if scheduled for any procedures [phone].

## 2024-01-09 ENCOUNTER — Other Ambulatory Visit: Payer: Self-pay | Admitting: *Deleted

## 2024-01-09 DIAGNOSIS — Z5181 Encounter for therapeutic drug level monitoring: Secondary | ICD-10-CM

## 2024-01-09 DIAGNOSIS — Z952 Presence of prosthetic heart valve: Secondary | ICD-10-CM

## 2024-01-09 DIAGNOSIS — Z7901 Long term (current) use of anticoagulants: Secondary | ICD-10-CM

## 2024-01-09 MED ORDER — WARFARIN SODIUM 5 MG PO TABS: ORAL_TABLET | ORAL | 0 refills | Status: DC

## 2024-01-09 NOTE — Telephone Encounter (Signed)
Warfarin 5mg  refill  S/p AVR Last INR 12/25/23 Last OV 02/17/23

## 2024-02-05 ENCOUNTER — Ambulatory Visit: Payer: MEDICAID

## 2024-02-14 ENCOUNTER — Ambulatory Visit: Payer: MEDICAID | Attending: Cardiology

## 2024-02-14 DIAGNOSIS — Z5181 Encounter for therapeutic drug level monitoring: Secondary | ICD-10-CM | POA: Diagnosis not present

## 2024-02-14 DIAGNOSIS — Z0000 Encounter for general adult medical examination without abnormal findings: Secondary | ICD-10-CM | POA: Diagnosis not present

## 2024-02-14 DIAGNOSIS — Z7901 Long term (current) use of anticoagulants: Secondary | ICD-10-CM

## 2024-02-14 DIAGNOSIS — Z952 Presence of prosthetic heart valve: Secondary | ICD-10-CM | POA: Diagnosis not present

## 2024-02-14 LAB — POCT INR: INR: 1 — AB (ref 2.0–3.0)

## 2024-02-14 MED ORDER — WARFARIN SODIUM 5 MG PO TABS: ORAL_TABLET | ORAL | 1 refills | Status: DC

## 2024-02-14 NOTE — Patient Instructions (Signed)
 Description   ONYX VALVE 1.5-2.0: Take 2 tablets today and tomorrow, then resume same dosage of Warfarin 1.5 tablets daily except 1 tablet on Mondays and Fridays.  Recheck INR in 1 week.  Call with any questions if ordered new medications or if scheduled for any procedures [phone].

## 2024-02-21 ENCOUNTER — Ambulatory Visit: Payer: MEDICAID | Attending: Cardiovascular Disease

## 2024-05-27 ENCOUNTER — Telehealth: Payer: Self-pay | Admitting: Cardiovascular Disease

## 2024-05-27 DIAGNOSIS — Z7901 Long term (current) use of anticoagulants: Secondary | ICD-10-CM

## 2024-05-27 DIAGNOSIS — Z952 Presence of prosthetic heart valve: Secondary | ICD-10-CM

## 2024-05-27 DIAGNOSIS — Z5181 Encounter for therapeutic drug level monitoring: Secondary | ICD-10-CM

## 2024-05-27 MED ORDER — WARFARIN SODIUM 5 MG PO TABS: ORAL_TABLET | ORAL | 0 refills | Status: DC

## 2024-05-27 NOTE — Telephone Encounter (Signed)
*  STAT* If patient is at the pharmacy, call can be transferred to refill team.   1. Which medications need to be refilled? (please list name of each medication and dose if known)   warfarin (COUMADIN ) 5 MG tablet    2. Which pharmacy/location (including street and city if local pharmacy) is medication to be sent to? [id number] - [place], [place] - [address] AT [facility] OF [address]   3. Do they need a 30 day or 90 day supply? 90

## 2024-05-27 NOTE — Telephone Encounter (Signed)
 Prescription refill request received for warfarin Lov: 02/17/23 ([name])  Next INR check: 02/21/24 OVERDUE  Warfarin tablet strength: 5mg   Pt overdue for INR check. Pt has scheduled appt on 05/31/24 with coumadin  clinic. I called pt to make him aware we can only refill 1 weeks worth of Warfarin until he comes to appt on Friday, no answer. Left message on voicemail.

## 2024-05-31 ENCOUNTER — Ambulatory Visit: Payer: MEDICAID

## 2024-05-31 ENCOUNTER — Other Ambulatory Visit: Payer: Self-pay

## 2024-05-31 ENCOUNTER — Telehealth: Payer: Self-pay | Admitting: Cardiovascular Disease

## 2024-05-31 DIAGNOSIS — Z952 Presence of prosthetic heart valve: Secondary | ICD-10-CM

## 2024-05-31 MED ORDER — WARFARIN SODIUM 5 MG PO TABS
ORAL_TABLET | ORAL | 0 refills | Status: DC
Start: 2024-05-31 — End: 2024-06-03

## 2024-05-31 NOTE — Telephone Encounter (Signed)
*  STAT* If patient is at the pharmacy, call can be transferred to refill team.   1. Which medications need to be refilled? (please list name of each medication and dose if known) Warfarin   2. Would you like to learn more about the convenience, safety, & potential cost savings by using the [facility]?      3. Are you open to using the [facility] (Type [facility].   4. Which pharmacy/location (including street and city if local pharmacy) is medication to be sent to? [facility]   5. Do they need a 30 day or 90 day supply?  Need enough until  his appointment on Monday- please call today- out of medicine

## 2024-06-03 ENCOUNTER — Other Ambulatory Visit: Payer: Self-pay

## 2024-06-03 ENCOUNTER — Ambulatory Visit: Payer: MEDICAID | Attending: Cardiovascular Disease

## 2024-06-03 DIAGNOSIS — Z952 Presence of prosthetic heart valve: Secondary | ICD-10-CM | POA: Insufficient documentation

## 2024-06-03 DIAGNOSIS — Z0000 Encounter for general adult medical examination without abnormal findings: Secondary | ICD-10-CM | POA: Insufficient documentation

## 2024-06-03 DIAGNOSIS — Z5181 Encounter for therapeutic drug level monitoring: Secondary | ICD-10-CM | POA: Insufficient documentation

## 2024-06-03 LAB — POCT INR: INR: 1.5 — AB (ref 2.0–3.0)

## 2024-06-03 MED ORDER — WARFARIN SODIUM 5 MG PO TABS: ORAL_TABLET | ORAL | 0 refills | Status: DC

## 2024-06-03 NOTE — Progress Notes (Signed)
Please see anticoagulation encounter.

## 2024-06-03 NOTE — Patient Instructions (Signed)
 ONYX VALVE 1.5-2.0: resume same dosage of Warfarin 1.5 tablets daily except 1 tablet on Mondays and Fridays.  Recheck INR in 3 week.  Call with any questions if ordered new medications or if scheduled for any procedures [phone].

## 2024-06-07 ENCOUNTER — Encounter (HOSPITAL_COMMUNITY): Payer: Self-pay

## 2024-06-07 ENCOUNTER — Ambulatory Visit (HOSPITAL_COMMUNITY)
Admission: EM | Admit: 2024-06-07 | Discharge: 2024-06-07 | Disposition: A | Discharge status: Home / Self Care | Payer: MEDICAID | Attending: Nurse Practitioner | Admitting: Nurse Practitioner

## 2024-06-07 VITALS — BP 126/82 | HR 82 | Temp 97.7°F | Resp 16

## 2024-06-07 DIAGNOSIS — Z952 Presence of prosthetic heart valve: Secondary | ICD-10-CM | POA: Diagnosis not present

## 2024-06-07 DIAGNOSIS — F17210 Nicotine dependence, cigarettes, uncomplicated: Secondary | ICD-10-CM | POA: Insufficient documentation

## 2024-06-07 DIAGNOSIS — Z79899 Other long term (current) drug therapy: Secondary | ICD-10-CM | POA: Diagnosis not present

## 2024-06-07 DIAGNOSIS — S20462A Insect bite (nonvenomous) of left back wall of thorax, initial encounter: Secondary | ICD-10-CM | POA: Diagnosis present

## 2024-06-07 DIAGNOSIS — Z7982 Long term (current) use of aspirin: Secondary | ICD-10-CM | POA: Insufficient documentation

## 2024-06-07 DIAGNOSIS — W57XXXA Bitten or stung by nonvenomous insect and other nonvenomous arthropods, initial encounter: Secondary | ICD-10-CM | POA: Diagnosis not present

## 2024-06-07 MED ORDER — DOXYCYCLINE HYCLATE 100 MG PO CAPS: 100.0000 mg | ORAL_CAPSULE | Freq: Two times a day (BID) | ORAL | 0 refills | Status: AC

## 2024-06-07 NOTE — ED Triage Notes (Signed)
 Pt states he removed a tick 1 week ago from his back and his girlfriend noticed last night that the area was red and needed looked at.

## 2024-06-07 NOTE — Discharge Instructions (Addendum)
 You were seen today for a tick bite that occurred about a week ago and has recently worsened. You are also experiencing headache, neck discomfort, and body aches, which may be early signs of Lyme disease. A blood test was ordered to check for Lyme disease, and you were started on doxycycline  to begin treatment right away. Take one pill twice daily for 10 days and be sure to complete the full course, even if symptoms improve. You can view your test results through your patient chart and will be contacted directly if the results are positive.  To help manage symptoms at home, you may take Tylenol  or ibuprofen  for headache, muscle aches, or general discomfort. Stay hydrated and rest as needed. Monitor the bite site for signs of spreading redness, warmth, or pus. Follow up with your primary care provider if symptoms persist beyond the course of treatment or worsen. Go to the emergency department if you develop a high fever, severe headache, stiff neck, chest pain, shortness of breath, facial drooping, weakness, or numbness. These could be signs of a more serious infection or neurological involvement and should be evaluated promptly.
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
## 2024-06-10 LAB — LYME DISEASE SEROLOGY W/REFLEX: Lyme Total Antibody EIA: NEGATIVE

## 2024-06-19 ENCOUNTER — Ambulatory Visit: Payer: MEDICAID | Attending: Cardiology | Admitting: Cardiology

## 2024-06-19 ENCOUNTER — Encounter: Payer: Self-pay | Admitting: Cardiology

## 2024-06-19 VITALS — BP 132/80 | HR 87 | Resp 16 | Ht 70.0 in | Wt 230.0 lb

## 2024-06-19 DIAGNOSIS — I358 Other nonrheumatic aortic valve disorders: Secondary | ICD-10-CM

## 2024-06-19 DIAGNOSIS — I359 Nonrheumatic aortic valve disorder, unspecified: Secondary | ICD-10-CM | POA: Diagnosis present

## 2024-06-19 DIAGNOSIS — F17210 Nicotine dependence, cigarettes, uncomplicated: Secondary | ICD-10-CM

## 2024-06-19 DIAGNOSIS — Z7901 Long term (current) use of anticoagulants: Secondary | ICD-10-CM

## 2024-06-19 DIAGNOSIS — Z952 Presence of prosthetic heart valve: Secondary | ICD-10-CM

## 2024-06-19 MED ORDER — NICOTINE POLACRILEX 2 MG MT LOZG: 2.0000 mg | LOZENGE | OROMUCOSAL | 0 refills | Status: AC | PRN

## 2024-06-19 MED ORDER — NICOTINE 14 MG/24HR TD PT24: 14.0000 mg | MEDICATED_PATCH | Freq: Every day | TRANSDERMAL | 0 refills | Status: AC

## 2024-06-19 NOTE — Patient Instructions (Signed)
 Medication Instructions:  Nicotine  patches and Lozenges  *If you need a refill on your cardiac medications before your next appointment, please call your pharmacy*  Testing/Procedures: Your physician has requested that you have an echocardiogram in 05/2025. Echocardiography is a painless test that uses sound waves to create images of your heart. It provides your doctor with information about the size and shape of your heart and how well your heart's chambers and valves are working. This procedure takes approximately one hour. There are no restrictions for this procedure. Please do NOT wear cologne, perfume, aftershave, or lotions (deodorant is allowed). Please arrive 15 minutes prior to your appointment time.  Please note: We ask at that you not bring children with you during ultrasound (echo/ vascular) testing. Due to room size and safety concerns, children are not allowed in the ultrasound rooms during exams. Our front office staff cannot provide observation of children in our lobby area while testing is being conducted. An adult accompanying a patient to their appointment will only be allowed in the ultrasound room at the discretion of the ultrasound technician under special circumstances. We apologize for any inconvenience.   Follow-Up: At [facility], you and your health needs are our priority.  As part of our continuing mission to provide you with exceptional heart care, our providers are all part of one team.  This team includes your primary Cardiologist (physician) and Advanced Practice Providers or APPs (Physician Assistants and Nurse Practitioners) who all work together to provide you with the care you need, when you need it.  Your next appointment:   1 year(s)  Provider:   Madonna Large DO  We recommend signing up for the patient portal called MyChart.  Sign up information is provided on this After Visit Summary.  MyChart is used to connect with patients for Virtual Visits  (Telemedicine).  Patients are able to view lab/test results, encounter notes, upcoming appointments, etc.  Non-urgent messages can be sent to your provider as well.   To learn more about what you can do with MyChart, go to [email or web address].

## 2024-06-19 NOTE — Progress Notes (Signed)
 Cardiology Office Note:  .   Date:  06/19/2024  ID:  Jeffery Bennett, DOB July 18, 1981, MRN [id number] PCP:  Patient, No Pcp Per  Former Cardiology Providers: Dr. Alveta [facility] Providers Cardiologist:  Aleene Alveta, MD , [facility] (established care 06/21/24) Electrophysiologist:  None  Click to update primary MD,subspecialty MD or APP then REFRESH:1}    Chief Complaint  Patient presents with   S/P AVR   Follow-up    History of Present Illness: .   Jeffery Bennett is a 43 y.o. 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  Formally under the care of Dr. Alveta who last saw Jeffery Bennett back in 02/17/2023. I am seeing him for the first time to re-establishing care.   Patient presents today for 1 year follow-up visit. Denies anginal chest pain or heart failure symptoms. Patient is compliant with oral anticoagulation and does not endorse any evidence of bleeding. Patient is aware of the need of antibiotic prophylaxis prior to dental procedures. Unfortunately, he continues to smoke 0.5 pack/day  No structured exercise program or daily routine.  Review of Systems: .   Review of Systems  Cardiovascular:  Negative for chest pain, claudication, irregular heartbeat, leg swelling, near-syncope, orthopnea, palpitations, paroxysmal nocturnal dyspnea and syncope.  Respiratory:  Negative for shortness of breath.   Hematologic/Lymphatic: Negative for bleeding problem.    Studies Reviewed:   EKG: EKG Interpretation Date/Time:  Wednesday June 19 2024 13:37:05 EDT Ventricular Rate:  83 PR Interval:  152 QRS Duration:  90 QT Interval:  376 QTC Calculation: 441 R Axis:   88  Text Interpretation: Normal sinus rhythm T wave abnormality, consider inferior  ischemia When compared with ECG of 19-May-2018 07:13, No significant change was found Confirmed by Michele Richardson [phone]) on 06/19/2024 1:45:55 PM  Echocardiogram: 02/22/2019  1. The left ventricle has normal systolic function, with an ejection  fraction of 55-60%. The cavity size was normal. Left ventricular diastolic  parameters were normal. There is abnormal septal motion consistent with  post-operative status.   2. The right ventricle has normal systolic function. The cavity was  normal. There is no increase in right ventricular wall thickness. Right  ventricular systolic pressure could not be assessed.   3. Right atrial size was mildly dilated.   4. Trivial pericardial effusion is present.   5. S/P On-X mechanical aortic valve. Mean gradient 13 mmHg. 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   RADIOLOGY: NA  Risk Assessment/Calculations:   NA   Labs:       Latest Ref Rng & Units 08/23/2019    4:06 PM 07/05/2018    2:11 PM 05/26/2018    9:46 AM  CBC  WBC 3.4 - 10.8 x10E3/uL 7.7  7.4  8.8   Hemoglobin 13.0 - 17.7 g/dL 87.4  89.1  8.7   Hematocrit 37.5 - 51.0 % 38.2  32.1  26.7   Platelets 150 - 450 x10E3/uL 259  356  378        Latest Ref Rng & Units 08/23/2019    4:06 PM 01/18/2019    4:21 PM 07/05/2018    2:11 PM  BMP  Glucose 65 - 99 mg/dL 889  72  874   BUN 6 - 20 mg/dL 17  15  22   Creatinine 0.76 - 1.27 mg/dL 8.78  8.86  8.71   BUN/Creat Ratio 9 - 20 14  13   NOT APPLICABLE   Sodium 134 - 144 mmol/L 143  142  137   Potassium 3.5 - 5.2 mmol/L 4.0  4.5  4.2   Chloride 96 - 106 mmol/L 104  103  99   CO2 20 - 29 mmol/L 25  27  28    Calcium 8.7 - 10.2 mg/dL 9.4  9.1  9.4       Latest Ref Rng & Units 08/23/2019    4:06 PM 01/18/2019    4:21 PM 07/05/2018    2:11 PM  CMP  Glucose 65 - 99 mg/dL 889  72  874   BUN 6 - 20 mg/dL 17  15  22    Creatinine 0.76 - 1.27 mg/dL 8.78  8.86  8.71   Sodium 134 - 144 mmol/L  143  142  137   Potassium 3.5 - 5.2 mmol/L 4.0  4.5  4.2   Chloride 96 - 106 mmol/L 104  103  99   CO2 20 - 29 mmol/L 25  27  28    Calcium 8.7 - 10.2 mg/dL 9.4  9.1  9.4     No results found for: CHOL, HDL, LDLCALC, LDLDIRECT, TRIG, CHOLHDL No results for input(s): LIPOA in the last 8760 hours. No components found for: NTPROBNP No results for input(s): PROBNP in the last 8760 hours. No results for input(s): TSH in the last 8760 hours.  Physical Exam:    Today's Vitals   06/19/24 1331  BP: 132/80  Pulse: 87  Resp: 16  SpO2: 96%  Weight: 230 lb (104.3 kg)  Height: 5' 10 (1.778 m)   Body mass index is 33 kg/m. Wt Readings from Last 3 Encounters:  06/19/24 230 lb (104.3 kg)  02/17/23 227 lb 6.4 oz (103.1 kg)  11/24/22 232 lb (105.2 kg)    Physical Exam  Constitutional: No distress.  hemodynamically stable  Neck: No JVD present.  Cardiovascular: Normal rate, regular rhythm, S1 normal and S2 normal. Exam reveals no gallop, no S3 and no S4.  No murmur heard. Mechanical click noted.   Pulmonary/Chest: Effort normal and breath sounds normal. No stridor. He has no wheezes. He has no rales.  Musculoskeletal:        General: No edema.     Cervical back: Neck supple.  Skin: Skin is warm.     Impression & Recommendation(s):  Impression:   ICD-10-CM   1. Aortic valve disease  I35.9 EKG 12-Lead    ECHOCARDIOGRAM COMPLETE    2. Endocarditis of aortic valve  I35.8     3. History of mechanical aortic valve replacement  Z95.2     4. Anticoagulated on Coumadin   Z79.01     5. Cigarette smoker  F17.210 nicotine  polacrilex (NICOTINE  MINI) 2 MG lozenge    nicotine  (NICODERM CQ  - DOSED IN MG/24 HOURS) 14 mg/24hr patch       Recommendation(s):  Aortic valve disease Endocarditis of aortic valve History of mechanical aortic valve replacement 21 mm On-X valve implanted May 20, 2018 Currently on Coumadin . Notes that he goes to his INR appointments  regularly Patient is aware of antibiotic prophylaxis. Echo will be ordered to evaluate for structural heart disease and left ventricular systolic function and valve function in June 2026  Anticoagulated on Coumadin  Indication: Mechanical aortic valve Goal INR 1.5-2.0  Cigarette smoker Tobacco cessation counseling: Currently smoking  0.5 packs/day   Patient denies claudication Patient is informed to follow-up with PCP and consider lung cancer screening if and when appropriate. Consider screening for peripheral artery disease with ankle-brachial index-defer to PCP.  He is informed of the dangers of tobacco abuse including stroke, cancer, and MI, as well as benefits of tobacco cessation. He is willing to quit at this time. 14 mins were spent counseling patient cessation techniques. We discussed various methods to help quit smoking, including deciding on a date to quit, joining a support group, pharmacological agents- nicotine  gum/patch/lozenges.  Patient is willing to be on nicotine  patches as well as lozenges, prescription provided I will reassess his progress at the next follow-up visit  Routine EKG notes sinus rhythm with T WI in the inferior leads.  Patient denies anginal chest pain.  In an asymptomatic male will hold off on additional testing at this time.  Patient is advised to get refills of his other noncardiac medications with PCP.  Discussed management of at least 2 chronic comorbid conditions Prescription drug management EKG ordered and independently reviewed Prior echocardiogram from February 22, 2019 reviewed Last progress note from Dr. Alveta from February 17, 2023 reviewed  Orders Placed:  Orders Placed This Encounter  Procedures   EKG 12-Lead   ECHOCARDIOGRAM COMPLETE    Standing Status:   Future    Expected Date:   05/12/2025    Where [facility] performed[facility]    Does the patient weigh less than or greater than 250 lbs?:   Patient weighs less than  250 lbs    Perflutren DEFINITY (image enhancing agent) should be administered unless hypersensitivity or allergy exist:   Administer Perflutren    Reason for exam-Echo:   Other-Full Diagnosis List    Full ICD-10/Reason for Exam:   Aortic valve disease [211043]   Final Medication List:    Meds ordered this encounter  Medications   nicotine  polacrilex (NICOTINE  MINI) 2 MG lozenge    Sig: Take 1 lozenge (2 mg total) by mouth as needed for smoking cessation.    Dispense:  100 tablet    Refill:  0   nicotine  (NICODERM CQ  - DOSED IN MG/24 HOURS) 14 mg/24hr patch    Sig: Place 1 patch (14 mg total) onto the skin daily.    Dispense:  28 patch    Refill:  0    There are no discontinued medications.   Current Outpatient Medications:    amoxicillin  (AMOXIL ) 500 MG tablet, Take 4 tablets (2,000 mg total) by mouth as directed. Take 4 tablets 1 hour prior to dental cleanings, procedures and surgerys., Disp: 4 tablet, Rfl: 4   aspirin  EC 81 MG EC tablet, Take 1 tablet (81 mg total) by mouth daily., Disp: , Rfl:    buPROPion  (WELLBUTRIN  SR) 150 MG 12 hr tablet, Take 1 tablet (150 mg total) by mouth 2 (two) times daily., Disp: 90 tablet, Rfl: 3   doxycycline  (VIBRAMYCIN ) 100 MG capsule, Take 1 capsule (100 mg total) by mouth 2 (two) times daily., Disp: 20 capsule, Rfl: 0   furosemide  (LASIX ) 20 MG tablet, Take 1 tablet (20 mg total) by mouth daily., Disp: 90 tablet, Rfl: 3   methadone  (DOLOPHINE ) 10 MG tablet, Take 110 mg by mouth daily. , Disp: , Rfl:    metoprolol  tartrate (LOPRESSOR ) 25 MG tablet, Take 1 tablet by mouth twice daily, Disp: 180 tablet, Rfl: 2   nicotine  (NICODERM CQ  - DOSED IN MG/24  HOURS) 14 mg/24hr patch, Place 1 patch (14 mg total) onto the skin daily., Disp: 28 patch, Rfl: 0   nicotine  polacrilex (NICOTINE  MINI) 2 MG lozenge, Take 1 lozenge (2 mg total) by mouth as needed for smoking cessation., Disp: 100 tablet, Rfl: 0   potassium chloride  (K-DUR) 10 MEQ tablet, Take 1 tablet 10  mEq when taking Lasix  40 mg dose., Disp: 30 tablet, Rfl: 3   sildenafil  (VIAGRA ) 100 MG tablet, Take as needed, Disp: 30 tablet, Rfl: 11   warfarin (COUMADIN ) 5 MG tablet, TAKE 1 TO 1 AND 1/2 TABLETS BY MOUTH EVERY DAY AS DIRECTED BY COAGULATION CLINIC, Disp: 20 tablet, Rfl: 0  Consent:   NA  Disposition:   July 2026-1 year follow-up visit given his history of mechanical aortic valve, status post echo  His questions and concerns were addressed to his satisfaction. He voices understanding of the recommendations provided during this encounter.    Signed, Madonna Michele HAS, [facility]  A Division of [facility] [address], [place], [place] [address]  [place], [place] [address]

## 2024-06-21 ENCOUNTER — Encounter: Payer: Self-pay | Admitting: Cardiology

## 2024-06-24 ENCOUNTER — Ambulatory Visit: Payer: MEDICAID | Attending: Cardiovascular Disease

## 2024-06-24 DIAGNOSIS — Z952 Presence of prosthetic heart valve: Secondary | ICD-10-CM | POA: Diagnosis not present

## 2024-06-24 LAB — POCT INR: INR: 1.5 — AB (ref 2.0–3.0)

## 2024-06-24 MED ORDER — WARFARIN SODIUM 5 MG PO TABS: ORAL_TABLET | ORAL | 0 refills | Status: DC

## 2024-06-24 NOTE — Patient Instructions (Addendum)
 Description   ONYX VALVE 1.5-2.0: Continue same dosage of Warfarin 1.5 tablets daily except 1 tablet on Mondays and Fridays.  Recheck INR in 4 week.  Main Number [phone] Call with any questions if ordered new medications or if scheduled for any procedures [phone]

## 2024-06-24 NOTE — Progress Notes (Signed)
Please see anticoagulation encounter.

## 2024-06-24 NOTE — Addendum Note (Signed)
 Addended by: JOESPH CHROMAN B on: 06/24/2024 10:29 AM   Modules accepted: Orders

## 2024-07-17 ENCOUNTER — Ambulatory Visit: Payer: MEDICAID | Attending: Cardiovascular Disease | Admitting: *Deleted

## 2024-07-17 DIAGNOSIS — Z5181 Encounter for therapeutic drug level monitoring: Secondary | ICD-10-CM | POA: Insufficient documentation

## 2024-07-17 DIAGNOSIS — Z952 Presence of prosthetic heart valve: Secondary | ICD-10-CM | POA: Insufficient documentation

## 2024-07-17 DIAGNOSIS — Z0000 Encounter for general adult medical examination without abnormal findings: Secondary | ICD-10-CM | POA: Diagnosis present

## 2024-07-17 LAB — POCT INR: INR: 1.3 — AB (ref 2.0–3.0)

## 2024-07-17 NOTE — Progress Notes (Signed)
 INR 1.3; Please see anticoagulation encounter.

## 2024-07-17 NOTE — Patient Instructions (Addendum)
 Description   ONYX VALVE 1.5-2.0: Today take 2 tablets of warfarin then continue same dosage of Warfarin 1.5 tablets daily except 1 tablet on Mondays and Fridays.  Recheck INR in 3 weeks.  Main Number [phone] Call with any questions if ordered new medications or if scheduled for any procedures [phone]

## 2024-07-22 ENCOUNTER — Ambulatory Visit: Payer: MEDICAID

## 2024-08-07 ENCOUNTER — Ambulatory Visit: Payer: MEDICAID | Attending: Cardiovascular Disease

## 2024-09-17 ENCOUNTER — Other Ambulatory Visit (HOSPITAL_COMMUNITY): Payer: Self-pay

## 2024-09-17 ENCOUNTER — Other Ambulatory Visit: Payer: Self-pay | Admitting: Cardiology

## 2024-09-17 DIAGNOSIS — Z952 Presence of prosthetic heart valve: Secondary | ICD-10-CM

## 2024-09-17 MED ORDER — WARFARIN SODIUM 5 MG PO TABS
ORAL_TABLET | ORAL | 0 refills | Status: DC
Start: 2024-09-17 — End: 2024-09-25
  Filled 2024-09-17: qty 20, 13d supply, fill #0

## 2024-09-17 NOTE — Telephone Encounter (Signed)
*  STAT* If patient is at the pharmacy, call can be transferred to refill team.   1. Which medications need to be refilled? (please list name of each medication and dose if known)   warfarin (COUMADIN ) 5 MG tablet    2. Which pharmacy/location (including street and city if local pharmacy) is medication to be sent to? [id number] - [place], [place] - [address] AT [facility] OF [address]   3. Do they need a 30 day or 90 day supply? 90

## 2024-09-17 NOTE — Telephone Encounter (Signed)
 Sent to coumadin 

## 2024-09-25 ENCOUNTER — Other Ambulatory Visit (HOSPITAL_COMMUNITY): Payer: Self-pay

## 2024-09-25 ENCOUNTER — Ambulatory Visit: Payer: MEDICAID | Attending: Cardiovascular Disease | Admitting: *Deleted

## 2024-09-25 DIAGNOSIS — Z952 Presence of prosthetic heart valve: Secondary | ICD-10-CM | POA: Insufficient documentation

## 2024-09-25 DIAGNOSIS — Z0000 Encounter for general adult medical examination without abnormal findings: Secondary | ICD-10-CM | POA: Diagnosis present

## 2024-09-25 DIAGNOSIS — Z5181 Encounter for therapeutic drug level monitoring: Secondary | ICD-10-CM | POA: Insufficient documentation

## 2024-09-25 LAB — POCT INR: INR: 1.9 — AB (ref 2.0–3.0)

## 2024-09-25 MED ORDER — WARFARIN SODIUM 5 MG PO TABS: ORAL_TABLET | ORAL | 0 refills | Status: DC

## 2024-09-25 NOTE — Progress Notes (Signed)
 Description   ONYX VALVE 1.5-2.0: INR-1.9; Continue taking the dose you have been taking, which is, Warfarin 2 tablets daily. Recheck INR in 4 weeks.  Main Number [phone] Call with any questions if ordered new medications or if scheduled for any procedures [phone]

## 2024-09-25 NOTE — Patient Instructions (Signed)
 Description   ONYX VALVE 1.5-2.0: INR-1.9; Continue taking the dose you have been taking, which is, Warfarin 2 tablets daily. Recheck INR in 4 weeks.  Main Number [phone] Call with any questions if ordered new medications or if scheduled for any procedures [phone]

## 2024-10-23 ENCOUNTER — Ambulatory Visit: Payer: MEDICAID | Attending: Cardiovascular Disease

## 2024-11-01 ENCOUNTER — Ambulatory Visit: Payer: MEDICAID | Attending: Cardiovascular Disease | Admitting: *Deleted

## 2024-11-01 DIAGNOSIS — Z952 Presence of prosthetic heart valve: Secondary | ICD-10-CM | POA: Insufficient documentation

## 2024-11-01 DIAGNOSIS — Z5181 Encounter for therapeutic drug level monitoring: Secondary | ICD-10-CM | POA: Diagnosis present

## 2024-11-01 LAB — POCT INR: INR: 1.3 — AB (ref 2.0–3.0)

## 2024-11-01 NOTE — Progress Notes (Signed)
 Description   ONYX VALVE 1.5-2.0: INR-1.3; Today take 3 tablets of warfarin then continue taking taking Warfarin 2 tablets daily. Recheck INR in 3 weeks.  Main Number [phone] Call with any questions if ordered new medications or if scheduled for any procedures [phone]

## 2024-11-01 NOTE — Patient Instructions (Addendum)
 Description   ONYX VALVE 1.5-2.0: INR-1.3; Today take 3 tablets of warfarin then continue taking taking Warfarin 2 tablets daily. Recheck INR in 3 weeks.  Main Number [phone] Call with any questions if ordered new medications or if scheduled for any procedures [phone]

## 2024-11-20 ENCOUNTER — Other Ambulatory Visit: Payer: Self-pay | Admitting: *Deleted

## 2024-11-20 ENCOUNTER — Ambulatory Visit: Payer: MEDICAID | Attending: Cardiovascular Disease | Admitting: *Deleted

## 2024-11-20 DIAGNOSIS — Z952 Presence of prosthetic heart valve: Secondary | ICD-10-CM

## 2024-11-20 DIAGNOSIS — Z5181 Encounter for therapeutic drug level monitoring: Secondary | ICD-10-CM

## 2024-11-20 LAB — POCT INR: INR: 1.7 — AB (ref 2.0–3.0)

## 2024-11-20 MED ORDER — WARFARIN SODIUM 5 MG PO TABS: ORAL_TABLET | ORAL | 1 refills | Status: DC

## 2024-11-20 NOTE — Telephone Encounter (Signed)
 Warfarin 5mg  Dx-s/p AVR Last INR Check-11/20/24 Last OV- 06/19/24

## 2024-11-20 NOTE — Progress Notes (Unsigned)
 Description   ONYX VALVE 1.5-2.0: INR-1.7; Continue taking taking Warfarin 2 tablets daily. Recheck INR in 4 weeks.  Main Number [phone] Call with any questions if ordered new medications or if scheduled for any procedures [phone]

## 2024-11-20 NOTE — Patient Instructions (Addendum)
 Description   ONYX VALVE 1.5-2.0: INR-1.7; Continue taking taking Warfarin 2 tablets daily. Recheck INR in 4 weeks.  Main Number [phone] Call with any questions if ordered new medications or if scheduled for any procedures [phone]

## 2024-12-18 ENCOUNTER — Ambulatory Visit: Payer: MEDICAID

## 2024-12-30 ENCOUNTER — Ambulatory Visit: Payer: MEDICAID | Admitting: Pharmacist

## 2024-12-30 DIAGNOSIS — Z952 Presence of prosthetic heart valve: Secondary | ICD-10-CM | POA: Diagnosis not present

## 2024-12-30 DIAGNOSIS — Z7901 Long term (current) use of anticoagulants: Secondary | ICD-10-CM | POA: Insufficient documentation

## 2024-12-30 LAB — POCT INR: INR: 1.1 — AB (ref 2.0–3.0)

## 2024-12-30 MED ORDER — WARFARIN SODIUM 5 MG PO TABS: ORAL_TABLET | ORAL | 1 refills | Status: AC

## 2024-12-30 NOTE — Progress Notes (Signed)
 Description   ONYX VALVE 1.5-2.0: INR-1.1; Take 3 tablets today and then continue taking taking Warfarin 2 tablets daily. Recheck INR in 2 weeks.  Main Number [phone] Call with any questions if ordered new medications or if scheduled for any procedures [phone]

## 2024-12-30 NOTE — Patient Instructions (Signed)
 Description   ONYX VALVE 1.5-2.0: INR-1.1; Take 3 tablets today and then continue taking taking Warfarin 2 tablets daily. Recheck INR in 2 weeks.  Main Number [phone] Call with any questions if ordered new medications or if scheduled for any procedures [phone]

## 2025-01-10 ENCOUNTER — Ambulatory Visit: Payer: MEDICAID | Attending: Cardiovascular Disease

## 2025-01-30 ENCOUNTER — Ambulatory Visit: Payer: MEDICAID

## 2025-01-31 ENCOUNTER — Ambulatory Visit: Payer: MEDICAID
